# Patient Record
Sex: Female | Born: 1985 | Race: White | Hispanic: No | Marital: Single | Smoking: Never smoker
Health system: Southern US, Community
[De-identification: ages and names within clinical notes are randomized; demographics above are authoritative.]

## PROBLEM LIST (undated history)

## (undated) ENCOUNTER — Inpatient Hospital Stay (HOSPITAL_COMMUNITY): Payer: Self-pay

## (undated) DIAGNOSIS — O039 Complete or unspecified spontaneous abortion without complication: Secondary | ICD-10-CM

## (undated) DIAGNOSIS — F419 Anxiety disorder, unspecified: Secondary | ICD-10-CM

## (undated) DIAGNOSIS — D689 Coagulation defect, unspecified: Secondary | ICD-10-CM

## (undated) DIAGNOSIS — Z8632 Personal history of gestational diabetes: Secondary | ICD-10-CM

## (undated) DIAGNOSIS — F909 Attention-deficit hyperactivity disorder, unspecified type: Secondary | ICD-10-CM

## (undated) DIAGNOSIS — D684 Acquired coagulation factor deficiency: Secondary | ICD-10-CM

## (undated) DIAGNOSIS — I509 Heart failure, unspecified: Secondary | ICD-10-CM

## (undated) DIAGNOSIS — Z8759 Personal history of other complications of pregnancy, childbirth and the puerperium: Secondary | ICD-10-CM

## (undated) DIAGNOSIS — I82409 Acute embolism and thrombosis of unspecified deep veins of unspecified lower extremity: Secondary | ICD-10-CM

## (undated) HISTORY — DX: Personal history of other complications of pregnancy, childbirth and the puerperium: Z87.59

## (undated) HISTORY — PX: APPENDECTOMY: SHX54

## (undated) HISTORY — PX: TONSILLECTOMY: SUR1361

## (undated) HISTORY — DX: Personal history of gestational diabetes: Z86.32

## (undated) HISTORY — PX: VENTRAL HERNIA REPAIR: SHX424

---

## 2005-06-26 DIAGNOSIS — D689 Coagulation defect, unspecified: Secondary | ICD-10-CM

## 2009-03-14 ENCOUNTER — Emergency Department (HOSPITAL_COMMUNITY): Admission: EM | Admit: 2009-03-14 | Discharge: 2009-03-14 | Payer: Self-pay | Admitting: Emergency Medicine

## 2012-12-26 ENCOUNTER — Encounter (HOSPITAL_COMMUNITY): Payer: Self-pay | Admitting: Emergency Medicine

## 2012-12-26 ENCOUNTER — Emergency Department (HOSPITAL_COMMUNITY)
Admission: EM | Admit: 2012-12-26 | Discharge: 2012-12-26 | Disposition: A | Discharge status: Home / Self Care | Payer: Medicaid - Out of State | Attending: Emergency Medicine | Admitting: Emergency Medicine

## 2012-12-26 ENCOUNTER — Emergency Department (HOSPITAL_COMMUNITY): Payer: Medicaid - Out of State

## 2012-12-26 DIAGNOSIS — Z8659 Personal history of other mental and behavioral disorders: Secondary | ICD-10-CM | POA: Insufficient documentation

## 2012-12-26 DIAGNOSIS — R109 Unspecified abdominal pain: Secondary | ICD-10-CM | POA: Insufficient documentation

## 2012-12-26 DIAGNOSIS — O209 Hemorrhage in early pregnancy, unspecified: Secondary | ICD-10-CM | POA: Insufficient documentation

## 2012-12-26 DIAGNOSIS — Z86718 Personal history of other venous thrombosis and embolism: Secondary | ICD-10-CM | POA: Insufficient documentation

## 2012-12-26 DIAGNOSIS — N949 Unspecified condition associated with female genital organs and menstrual cycle: Secondary | ICD-10-CM | POA: Insufficient documentation

## 2012-12-26 DIAGNOSIS — R110 Nausea: Secondary | ICD-10-CM | POA: Insufficient documentation

## 2012-12-26 DIAGNOSIS — N938 Other specified abnormal uterine and vaginal bleeding: Secondary | ICD-10-CM | POA: Insufficient documentation

## 2012-12-26 DIAGNOSIS — Z862 Personal history of diseases of the blood and blood-forming organs and certain disorders involving the immune mechanism: Secondary | ICD-10-CM | POA: Insufficient documentation

## 2012-12-26 DIAGNOSIS — R42 Dizziness and giddiness: Secondary | ICD-10-CM | POA: Insufficient documentation

## 2012-12-26 DIAGNOSIS — O039 Complete or unspecified spontaneous abortion without complication: Secondary | ICD-10-CM

## 2012-12-26 HISTORY — DX: Attention-deficit hyperactivity disorder, unspecified type: F90.9

## 2012-12-26 HISTORY — DX: Coagulation defect, unspecified: D68.9

## 2012-12-26 HISTORY — DX: Acute embolism and thrombosis of unspecified deep veins of unspecified lower extremity: I82.409

## 2012-12-26 HISTORY — DX: Complete or unspecified spontaneous abortion without complication: O03.9

## 2012-12-26 HISTORY — DX: Anxiety disorder, unspecified: F41.9

## 2012-12-26 HISTORY — DX: Acquired coagulation factor deficiency: D68.4

## 2012-12-26 LAB — CBC WITH DIFFERENTIAL/PLATELET
Eosinophils Relative: 2 % (ref 0–5)
HCT: 33.7 % — ABNORMAL LOW (ref 36.0–46.0)
Lymphocytes Relative: 27 % (ref 12–46)
Lymphs Abs: 2.3 10*3/uL (ref 0.7–4.0)
MCV: 78.6 fL (ref 78.0–100.0)
Platelets: 161 10*3/uL (ref 150–400)
RBC: 4.29 MIL/uL (ref 3.87–5.11)
WBC: 8.3 10*3/uL (ref 4.0–10.5)

## 2012-12-26 LAB — WET PREP, GENITAL: Yeast Wet Prep HPF POC: NONE SEEN

## 2012-12-26 LAB — HCG, QUANTITATIVE, PREGNANCY: hCG, Beta Chain, Quant, S: <1 m[IU]/mL (ref <5)

## 2012-12-26 MED ORDER — MEGESTROL ACETATE 20 MG PO TABS: ORAL_TABLET | ORAL | Status: DC

## 2012-12-26 MED ORDER — HYDROCODONE-ACETAMINOPHEN 5-325 MG PO TABS: 1.0000 | ORAL_TABLET | ORAL | Status: DC | PRN

## 2012-12-26 MED ORDER — SODIUM CHLORIDE 0.9 % IV SOLN
INTRAVENOUS | Status: DC
  Administered 2012-12-26: 12:00:00 via INTRAVENOUS

## 2012-12-26 NOTE — ED Provider Notes (Signed)
History    CSN: [id number] Arrival date & time 12/26/12  1104  First MD Initiated Contact with Patient 12/26/12 1108     Chief Complaint  Patient presents with  . Abdominal Pain  . Vaginal Bleeding   (Consider location/radiation/quality/duration/timing/severity/associated sxs/prior Treatment) Patient is a 27 y.o. female presenting with vaginal bleeding. The history is provided by the patient.  Vaginal Bleeding Quality:  Bright red, heavier than menses and clots Severity:  Severe Onset quality:  Gradual Duration:  3 weeks Timing:  Intermittent Progression:  Unchanged Chronicity:  New Menstrual history:  Regular Possible pregnancy: miscarriage.   Context: spontaneously   Relieved by:  Nothing Worsened by:  Nothing tried Ineffective treatments: birth control pills. Associated symptoms: abdominal pain, dizziness and nausea   Associated symptoms: no fever    Brooke Sandoval is a 27 y.o. female who presents to the ED with vaginal bleeding. She states that she had her last normal period in April. She was not on any birth control. She began having heavy bleeding and abdominal cramping pain about 3 or 4 weeks ago. She passed a large clot and went to the hospital in [place]. They told her she had a miscarriage. She did not have an ultrasound at that time. She followed up with a GYN and was told her "numbers" were dropping and was scheduled to follow up for an ultrasound. She was placed on "large dose of birth control pills at that visit". She states that she returned but the doctor was not there so could not get ultrasound. Today she is feeling dizzy and continues to bleed so she came here.   Past Medical History  Diagnosis Date  . DVT (deep venous thrombosis)   . Coagulation factor disorder   . Miscarriage   . ADHD (attention deficit hyperactivity disorder)   . Anxiety    Past Surgical History  Procedure Laterality Date  . Cesarean section     No family history on file. History   Substance Use Topics  . Smoking status: Not on file  . Smokeless tobacco: Not on file  . Alcohol Use: Not on file   OB History   Grav Para Term Preterm Abortions TAB SAB Ect Mult Living                 Review of Systems  Constitutional: Negative for fever and chills.  Respiratory: Negative for shortness of breath.   Gastrointestinal: Positive for nausea and abdominal pain.  Genitourinary: Positive for vaginal bleeding.  Musculoskeletal: Negative for gait problem.  Skin: Negative for rash.  Neurological: Positive for dizziness.  Psychiatric/Behavioral: The patient is not nervous/anxious.     Allergies  Review of patient's allergies indicates no known allergies.  Home Medications   Current Outpatient Rx  Name  Route  Sig  Dispense  Refill  . ibuprofen (ADVIL,MOTRIN) 800 MG tablet   Oral   Take 800 mg by mouth every 8 (eight) hours as needed for pain.          BP 147/91  Pulse 85  Temp(Src) 98 F (36.7 C) (Oral)  Resp 20  Ht 5\' 8"  (1.727 m)  Wt 348 lb (157.852 kg)  BMI 52.93 kg/m2  SpO2 100% Physical Exam  Nursing note and vitals reviewed. Constitutional: She is oriented to person, place, and time. No distress.  Morbidly obese W/F  HENT:  Head: Normocephalic.  Eyes: EOM are normal.  Neck: Neck supple.  Cardiovascular: Normal rate.   Pulmonary/Chest: Effort normal.  Abdominal:  Soft. There is tenderness in the right lower quadrant and left lower quadrant. There is no rebound, no guarding and no CVA tenderness.  Genitourinary:  External genitalia without lesions. Small blood vaginal vault. Positive CMT, bilateral adnexal tenderness. Unable to palpate uterus due to patient habitus.  Musculoskeletal: Normal range of motion.  Neurological: She is alert and oriented to person, place, and time. No cranial nerve deficit.  Skin: Skin is warm and dry.  Psychiatric: She has a normal mood and affect. Her behavior is normal.    Results for orders placed during the  hospital encounter of 12/26/12 (from the past 24 hour(s))  CBC WITH DIFFERENTIAL     Status: Abnormal   Collection Time    12/26/12 12:05 PM      Result Value Range   WBC 8.3  4.0 - 10.5 K/uL   RBC 4.29  3.87 - 5.11 MIL/uL   Hemoglobin 11.0 (*) 12.0 - 15.0 g/dL   HCT 16.1 (*) 09.6 - 04.5 %   MCV 78.6  78.0 - 100.0 fL   MCH 25.6 (*) 26.0 - 34.0 pg   MCHC 32.6  30.0 - 36.0 g/dL   RDW 40.9  81.1 - 91.4 %   Platelets 161  150 - 400 K/uL   Neutrophils Relative % 67  43 - 77 %   Neutro Abs 5.6  1.7 - 7.7 K/uL   Lymphocytes Relative 27  12 - 46 %   Lymphs Abs 2.3  0.7 - 4.0 K/uL   Monocytes Relative 3  3 - 12 %   Monocytes Absolute 0.2  0.1 - 1.0 K/uL   Eosinophils Relative 2  0 - 5 %   Eosinophils Absolute 0.2  0.0 - 0.7 K/uL   Basophils Relative 0  0 - 1 %   Basophils Absolute 0.0  0.0 - 0.1 K/uL  HCG, QUANTITATIVE, PREGNANCY     Status: None   Collection Time    12/26/12 12:05 PM      Result Value Range   hCG, Beta Chain, Quant, S <1  <5 mIU/mL  ABO/RH     Status: None   Collection Time    12/26/12 12:05 PM      Result Value Range   ABO/RH(D) A POS      ED Course  Procedures   US Transvaginal Non-ob  12/26/2012   *RADIOLOGY REPORT*  Clinical Data: Status post SAB with pelvic pain and heavy bleeding. LMP 09/24/2012  TRANSABDOMINAL AND TRANSVAGINAL ULTRASOUND OF PELVIS Technique:  Both transabdominal and transvaginal ultrasound examinations of the pelvis were performed. Transabdominal technique was performed for global imaging of the pelvis including uterus, ovaries, adnexal regions, and pelvic cul-de-sac.  It was necessary to proceed with endovaginal exam following the transabdominal exam to visualize the myometrium, endometrium and adnexa.  Comparison:  None  Findings:  Uterus: Is anteverted and anteflexed and demonstrates a sagittal length of 11 cm, depth of 4.7 cm and width of 4.3 cm.  No definite mural abnormality is seen  Endometrium: A small amount of simple free fluid is noted  in the endometrial canal.  The endometrial lining appears thin with a single layer measurement 1 mm.  No areas of focal thickening or heterogeneity are identified.  No soft tissue is seen within the endometrial canal to suggest the presence of retained products of conception in this patient with history of recent SAB.  Color Doppler reveals no areas of focal flow within the endometrial canal.  Right ovary:  Is  not seen with confidence either transabdominally or endovaginally  Left ovary: Has a normal appearance measuring 2.4 x 1.4 x 2.1 cm  Other findings: Trace of simple free fluid is seen in the cul-de- sac.  IMPRESSION: No evidence for retained products of conception or focal endometrial abnormality.  Normal myometrium and left ovary.  Non-visualized right ovary.   Original Report Authenticated By: Rhodia Albright, M.D.   US Pelvis Complete  12/26/2012   *RADIOLOGY REPORT*  Clinical Data: Status post SAB with pelvic pain and heavy bleeding. LMP 09/24/2012  TRANSABDOMINAL AND TRANSVAGINAL ULTRASOUND OF PELVIS Technique:  Both transabdominal and transvaginal ultrasound examinations of the pelvis were performed. Transabdominal technique was performed for global imaging of the pelvis including uterus, ovaries, adnexal regions, and pelvic cul-de-sac.  It was necessary to proceed with endovaginal exam following the transabdominal exam to visualize the myometrium, endometrium and adnexa.  Comparison:  None  Findings:  Uterus: Is anteverted and anteflexed and demonstrates a sagittal length of 11 cm, depth of 4.7 cm and width of 4.3 cm.  No definite mural abnormality is seen  Endometrium: A small amount of simple free fluid is noted in the endometrial canal.  The endometrial lining appears thin with a single layer measurement 1 mm.  No areas of focal thickening or heterogeneity are identified.  No soft tissue is seen within the endometrial canal to suggest the presence of retained products of conception in this patient  with history of recent SAB.  Color Doppler reveals no areas of focal flow within the endometrial canal.  Right ovary:  Is not seen with confidence either transabdominally or endovaginally  Left ovary: Has a normal appearance measuring 2.4 x 1.4 x 2.1 cm  Other findings: Trace of simple free fluid is seen in the cul-de- sac.  IMPRESSION: No evidence for retained products of conception or focal endometrial abnormality.  Normal myometrium and left ovary.  Non-visualized right ovary.   Original Report Authenticated By: Rhodia Albright, M.D.    MDM  27 y.o. female with complete SAB and abnormal vaginal bleeding. Will start patient on Megace and have her follow up with Dr. Emelda Fear for further evaluation. Her Hgb. Is stable and she is not orthostatic. No concern for ectopic pregnancy or retained POC as Bhcg is <1 and ultrasound shows no abnormalities.  I have reviewed this patient's vital signs, nurses notes, appropriate labs and imaging.  I have discussed findings in detail with the patient and plan of care. She voices understanding.   Medication List    TAKE these medications       HYDROcodone-acetaminophen 5-325 MG per tablet  Commonly known as:  NORCO/VICODIN  Take 1 tablet by mouth every 4 (four) hours as needed.     megestrol 20 MG tablet  Commonly known as:  MEGACE  - Take two tablets (40 mg) three times per day time three days,   - then take two tablets (40 mg) two times per day time three days,   - then take two tablets (40 mg)once per day      ASK your doctor about these medications       ibuprofen 800 MG tablet  Commonly known as:  ADVIL,MOTRIN  Take 800 mg by mouth every 8 (eight) hours as needed for pain.         [place], [place] 12/26/12 [phone]

## 2012-12-26 NOTE — ED Notes (Signed)
States that she was diagnosed with a miscarriage and prescribed birth control medication 1 week ago.  States that she is having heavy vaginal bleeding and feels like she is having contractions.

## 2012-12-27 LAB — GC/CHLAMYDIA PROBE AMP: GC Probe RNA: NEGATIVE

## 2012-12-27 NOTE — ED Provider Notes (Signed)
Medical screening examination/treatment/procedure(s) were performed by non-physician practitioner and as supervising physician I was immediately available for consultation/collaboration.   Kadynce Bonds B. Bernette Mayers, MD 12/27/12 [phone]

## 2013-09-29 ENCOUNTER — Encounter (HOSPITAL_COMMUNITY): Payer: Self-pay | Admitting: Emergency Medicine

## 2013-09-29 ENCOUNTER — Emergency Department (HOSPITAL_COMMUNITY)
Admission: EM | Admit: 2013-09-29 | Discharge: 2013-09-29 | Discharge status: Against Medical Advice | Payer: Medicaid - Out of State | Attending: Emergency Medicine | Admitting: Emergency Medicine

## 2013-09-29 DIAGNOSIS — R112 Nausea with vomiting, unspecified: Secondary | ICD-10-CM | POA: Insufficient documentation

## 2013-09-29 DIAGNOSIS — M79609 Pain in unspecified limb: Secondary | ICD-10-CM | POA: Insufficient documentation

## 2013-09-29 NOTE — ED Notes (Signed)
Called for ex-ray no answer

## 2013-09-29 NOTE — ED Notes (Addendum)
Pt thinks she may be preg LMP 07/27/13 it was a jhort one and she has a foot pain  Also has been N/v G6 P4 A2 L4 last one   Delivered at at 24 weeks  Is very high risk preg

## 2013-09-29 NOTE — ED Notes (Signed)
Called from waiting waiting room no answer

## 2013-10-07 ENCOUNTER — Encounter (HOSPITAL_COMMUNITY): Payer: Self-pay | Admitting: Emergency Medicine

## 2013-10-07 ENCOUNTER — Emergency Department (HOSPITAL_COMMUNITY): Payer: Medicaid - Out of State

## 2013-10-07 ENCOUNTER — Emergency Department (HOSPITAL_COMMUNITY)
Admission: EM | Admit: 2013-10-07 | Discharge: 2013-10-08 | Disposition: A | Discharge status: Home / Self Care | Payer: Medicaid - Out of State | Attending: Emergency Medicine | Admitting: Emergency Medicine

## 2013-10-07 DIAGNOSIS — S060X1A Concussion with loss of consciousness of 30 minutes or less, initial encounter: Secondary | ICD-10-CM | POA: Insufficient documentation

## 2013-10-07 DIAGNOSIS — R55 Syncope and collapse: Secondary | ICD-10-CM | POA: Insufficient documentation

## 2013-10-07 DIAGNOSIS — S0990XA Unspecified injury of head, initial encounter: Secondary | ICD-10-CM

## 2013-10-07 DIAGNOSIS — R296 Repeated falls: Secondary | ICD-10-CM | POA: Insufficient documentation

## 2013-10-07 DIAGNOSIS — Z3490 Encounter for supervision of normal pregnancy, unspecified, unspecified trimester: Secondary | ICD-10-CM

## 2013-10-07 DIAGNOSIS — Y939 Activity, unspecified: Secondary | ICD-10-CM | POA: Insufficient documentation

## 2013-10-07 DIAGNOSIS — Z86718 Personal history of other venous thrombosis and embolism: Secondary | ICD-10-CM | POA: Insufficient documentation

## 2013-10-07 DIAGNOSIS — Z8659 Personal history of other mental and behavioral disorders: Secondary | ICD-10-CM | POA: Insufficient documentation

## 2013-10-07 DIAGNOSIS — O9989 Other specified diseases and conditions complicating pregnancy, childbirth and the puerperium: Secondary | ICD-10-CM | POA: Insufficient documentation

## 2013-10-07 DIAGNOSIS — S20219A Contusion of unspecified front wall of thorax, initial encounter: Secondary | ICD-10-CM | POA: Insufficient documentation

## 2013-10-07 DIAGNOSIS — Y929 Unspecified place or not applicable: Secondary | ICD-10-CM | POA: Insufficient documentation

## 2013-10-07 LAB — CBC WITH DIFFERENTIAL/PLATELET
BASOS PCT: 1 % (ref 0–1)
Basophils Absolute: 0.1 10*3/uL (ref 0.0–0.1)
Eosinophils Absolute: 0.2 10*3/uL (ref 0.0–0.7)
Eosinophils Relative: 2 % (ref 0–5)
HEMATOCRIT: 35.7 % — AB (ref 36.0–46.0)
HEMOGLOBIN: 11.8 g/dL — AB (ref 12.0–15.0)
LYMPHS ABS: 3 10*3/uL (ref 0.7–4.0)
LYMPHS PCT: 33 % (ref 12–46)
MCH: 26.3 pg (ref 26.0–34.0)
MCHC: 33.1 g/dL (ref 30.0–36.0)
MCV: 79.7 fL (ref 78.0–100.0)
MONO ABS: 0.6 10*3/uL (ref 0.1–1.0)
MONOS PCT: 7 % (ref 3–12)
NEUTROS ABS: 5.2 10*3/uL (ref 1.7–7.7)
NEUTROS PCT: 57 % (ref 43–77)
Platelets: 178 10*3/uL (ref 150–400)
RBC: 4.48 MIL/uL (ref 3.87–5.11)
RDW: 16.7 % — ABNORMAL HIGH (ref 11.5–15.5)
WBC: 9.1 10*3/uL (ref 4.0–10.5)

## 2013-10-07 LAB — URINALYSIS, ROUTINE W REFLEX MICROSCOPIC
Bilirubin Urine: NEGATIVE
GLUCOSE, UA: NEGATIVE mg/dL
HGB URINE DIPSTICK: NEGATIVE
Ketones, ur: NEGATIVE mg/dL
Leukocytes, UA: NEGATIVE
Nitrite: NEGATIVE
PH: 5.5 (ref 5.0–8.0)
Protein, ur: NEGATIVE mg/dL
SPECIFIC GRAVITY, URINE: 1.025 (ref 1.005–1.030)
UROBILINOGEN UA: 0.2 mg/dL (ref 0.0–1.0)

## 2013-10-07 LAB — BASIC METABOLIC PANEL
BUN: 12 mg/dL (ref 6–23)
CHLORIDE: 102 meq/L (ref 96–112)
CO2: 25 mEq/L (ref 19–32)
Calcium: 9.2 mg/dL (ref 8.4–10.5)
Creatinine, Ser: 0.93 mg/dL (ref 0.50–1.10)
GFR, EST NON AFRICAN AMERICAN: 82 mL/min — AB (ref >90)
Glucose, Bld: 107 mg/dL — ABNORMAL HIGH (ref 70–99)
POTASSIUM: 4 meq/L (ref 3.7–5.3)
SODIUM: 138 meq/L (ref 137–147)

## 2013-10-07 LAB — HCG, SERUM, QUALITATIVE: PREG SERUM: POSITIVE — AB

## 2013-10-07 MED ORDER — HYDROCODONE-ACETAMINOPHEN 5-325 MG PO TABS: 2.0000 | ORAL_TABLET | Freq: Every evening | ORAL | Status: DC | PRN

## 2013-10-07 MED ORDER — HYDROCODONE-ACETAMINOPHEN 5-325 MG PO TABS
2.0000 | ORAL_TABLET | Freq: Once | ORAL | Status: AC
  Administered 2013-10-07: 2 via ORAL
  Filled 2013-10-07: qty 2

## 2013-10-07 NOTE — Discharge Instructions (Signed)
You have been diagnosed by your caregiver as having chest wall pain. SEEK IMMEDIATE MEDICAL ATTENTION IF: You develop a fever.  Your chest pains become severe or intolerable.  You develop new, unexplained symptoms (problems).  You develop shortness of breath, nausea, vomiting, sweating or feel light headed.  You develop a new cough or you cough up blood.  You have had a head injury which does not appear to require admission at this time. A concussion is a state of changed mental ability from trauma. SEEK IMMEDIATE MEDICAL ATTENTION IF: There is confusion or drowsiness (although children frequently become drowsy after injury).  You cannot awaken the injured person.  There is nausea (feeling sick to your stomach) or continued, forceful vomiting.  You notice dizziness or unsteadiness which is getting worse, or inability to walk.  You have convulsions or unconsciousness.  You experience severe, persistent headaches not relieved by Tylenol?. (Do not take aspirin as this impairs clotting abilities). Take other pain medications only as directed.  You cannot use arms or legs normally.  There are changes in pupil sizes. (This is the black center in the colored part of the eye)  There is clear or bloody discharge from the nose or ears.  Change in speech, vision, swallowing, or understanding.  Localized weakness, numbness, tingling, or change in bowel or bladder control.  Your caregiver has seen you today because you are having problems with feelings of weakness, dizziness, and/or fatigue. Weakness has many different causes, some of which are common and others are very rare. Your caregiver has considered some of the most common causes of weakness and feels it is safe for you to go home and be observed. Not every illness or injury can be identified during an emergency department visit, thus follow-up with your primary healthcare provider is important. Medical conditions can also worsen, so it is also  important to return immediately as directed below, or if you have other serious concerns develop. RETURN IMMEDIATELY IF you develop new shortness of breath, chest pain, fever, have difficulty moving parts of your body (new weakness, numbness, or incoordination), sudden change in speech, vision, swallowing, or understanding, faint or develop new dizziness, severe headache, become poorly responsive or have an altered mental status compared to baseline for you, new rash, abdominal pain, or bloody stools,  Return sooner also if you develop new problems for which you have not talked to your caregiver but you feel may be emergency medical conditions, or are unable to be cared for safely at home.

## 2013-10-07 NOTE — ED Notes (Signed)
Pt states she fell on Saturday. Pt complaining left side & hip pain. Pt states she is SOB also when she bends over. Pt says thinks she may be pregnant no period in 3 months.

## 2013-10-07 NOTE — ED Provider Notes (Signed)
CSN: [id number]     Arrival date & time 10/07/13  2030 History   First MD Initiated Contact with Patient 10/07/13 2109    This chart was scribed for Hurman HornJohn M Llana Deshazo, MD by Marica OtterNusrat Rahman, ED Scribe. This patient was seen in room APOTF/OTF and the patient's care was started at 9:12 PM.  PCP: No PCP Per Patient  Chief Complaint  Patient presents with  . Fall   HPI HPI Comments: Brooke Sandoval is a 28 y.o. female, with a history of coagulation factor disorder and DVT, who presents to the Emergency Department complaining of a fall onset three days ago. Pt also complains of associated intermittent nausea, loss of consciousness (witnessed by her husband), nasal pain and chest wall pain onset 3 days ago. Pt reports that she had nausea and vomiting on Saturday prior to her fall. Pt reports she lost consciousness which led her to fall on her face, and consequent nasal pain and chest wall pain. Pt denies any present HA, one sided weakness, fever, diarrhea, abd pain, or vision changes.   Pt also complains of urgency onset a few days ago. Pt reports she had similar symptoms a few months ago.   Pt also reports that she may be pregnant. Pt states she has not had her period in three months and she took several home pregnancy tests all of which were positive. No abdominal pain or vaginal bleeding.  Past Medical History  Diagnosis Date  . DVT (deep venous thrombosis)   . Coagulation factor disorder   . Miscarriage   . ADHD (attention deficit hyperactivity disorder)   . Anxiety    Past Surgical History  Procedure Laterality Date  . Cesarean section     No family history on file. History  Substance Use Topics  . Smoking status: Never Smoker   . Smokeless tobacco: Not on file  . Alcohol Use: No   OB History   Grav Para Term Preterm Abortions TAB SAB Ect Mult Living   1              Review of Systems  A complete 10 system review of systems was obtained and all systems are negative except as noted  in the HPI and PMH.    Allergies  Prednisone  Home Medications   Prior to Admission medications   Medication Sig Start Date End Date Taking? Authorizing Provider  amphetamine-dextroamphetamine (ADDERALL) 30 MG tablet Take 30 mg by mouth 3 (three) times daily.   Yes Historical Provider, MD  ibuprofen (ADVIL,MOTRIN) 200 MG tablet Take 200 mg by mouth every 6 (six) hours as needed for mild pain or moderate pain.   Yes Historical Provider, MD   Triage Vitals: BP 141/68  Pulse 87  Temp(Src) 98.3 F (36.8 C) (Oral)  Resp 18  Ht 5\' 7"  (1.702 m)  Wt 354 lb (160.573 kg)  BMI 55.43 kg/m2  SpO2 100%  LMP 07/09/2013 Physical Exam  Nursing note and vitals reviewed. Constitutional:  Awake, alert, nontoxic appearance with baseline speech for patient.  HENT:  Head: Atraumatic.  Mouth/Throat: No oropharyngeal exudate.  Tender nasal bridge. Nares no septal hematoma. Normal jaw opening, normal jaw occlusion.   Eyes: EOM are normal. Pupils are equal, round, and reactive to light. Right eye exhibits no discharge. Left eye exhibits no discharge.  Neck: Neck supple.  Cervical spine non-tender. No midline tenderness.   Cardiovascular: Normal rate and regular rhythm.   No murmur heard. Pulmonary/Chest: Effort normal and breath sounds normal. No  stridor. No respiratory distress. She has no wheezes. She has no rales. She exhibits no tenderness.  Left chest wall tenderness without palpable deformity.   Abdominal: Soft. Bowel sounds are normal. She exhibits no mass. There is no tenderness. There is no rebound.  Musculoskeletal: She exhibits no tenderness.  Baseline ROM, moves extremities with no obvious new focal weakness.  Lymphadenopathy:    She has no cervical adenopathy.  Neurological:  Awake, alert, cooperative and aware of situation; motor strength bilaterally; sensation normal to light touch bilaterally; peripheral visual fields full to confrontation; no facial asymmetry; tongue midline; major  cranial nerves appear intact; no pronator drift, normal finger to nose bilaterally, baseline gait without new ataxia.  Skin: No rash noted.  Psychiatric: She has a normal mood and affect.    ED Course  Procedures (including critical care time) DIAGNOSTIC STUDIES: Oxygen Saturation is 100% on RA, normal by my interpretation.    COORDINATION OF CARE:  9:21 PM-Discussed treatment plan which includes UA, pregnancy test, EKG, chest x-ray, and labs with pt at bedside and pt agreed to plan.   Labs Review Labs Reviewed  BASIC METABOLIC PANEL - Abnormal; Notable for the following:    Glucose, Bld 107 (*)    GFR calc non Af Amer 82 (*)    All other components within normal limits  CBC WITH DIFFERENTIAL - Abnormal; Notable for the following:    Hemoglobin 11.8 (*)    HCT 35.7 (*)    RDW 16.7 (*)    All other components within normal limits  HCG, SERUM, QUALITATIVE - Abnormal; Notable for the following:    Preg, Serum POSITIVE (*)    All other components within normal limits  URINALYSIS, ROUTINE W REFLEX MICROSCOPIC    Imaging Review No results found.  Muse not working; ECG: Normal sinus rhythm, ventricular rate 91, normal axis, normal intervals, impression normal ECG, no comparison ECG available  MDM   Final diagnoses:  Syncope  Minor head injury  Chest wall contusion  Pregnancy    I doubt any other EMC precluding discharge at this time including, but not necessarily limited to the following:ICH, CSI, ectopic pregnancy, cardiac syncope.  I personally performed the services described in this documentation, which was scribed in my presence. The recorded information has been reviewed and is accurate.    Hurman HornJohn M Tyronda Vizcarrondo, MD 10/12/13 [phone]

## 2013-11-06 ENCOUNTER — Emergency Department (HOSPITAL_COMMUNITY)
Admission: EM | Admit: 2013-11-06 | Discharge: 2013-11-06 | Disposition: A | Discharge status: Home / Self Care | Payer: Medicaid - Out of State | Attending: Emergency Medicine | Admitting: Emergency Medicine

## 2013-11-06 ENCOUNTER — Emergency Department (HOSPITAL_COMMUNITY): Payer: Medicaid - Out of State

## 2013-11-06 ENCOUNTER — Encounter (HOSPITAL_COMMUNITY): Payer: Self-pay | Admitting: Emergency Medicine

## 2013-11-06 DIAGNOSIS — O9989 Other specified diseases and conditions complicating pregnancy, childbirth and the puerperium: Secondary | ICD-10-CM | POA: Insufficient documentation

## 2013-11-06 DIAGNOSIS — Z3490 Encounter for supervision of normal pregnancy, unspecified, unspecified trimester: Secondary | ICD-10-CM

## 2013-11-06 DIAGNOSIS — O99340 Other mental disorders complicating pregnancy, unspecified trimester: Secondary | ICD-10-CM | POA: Insufficient documentation

## 2013-11-06 DIAGNOSIS — N939 Abnormal uterine and vaginal bleeding, unspecified: Secondary | ICD-10-CM

## 2013-11-06 DIAGNOSIS — Z7901 Long term (current) use of anticoagulants: Secondary | ICD-10-CM | POA: Insufficient documentation

## 2013-11-06 DIAGNOSIS — O210 Mild hyperemesis gravidarum: Secondary | ICD-10-CM | POA: Insufficient documentation

## 2013-11-06 DIAGNOSIS — Z791 Long term (current) use of non-steroidal anti-inflammatories (NSAID): Secondary | ICD-10-CM | POA: Insufficient documentation

## 2013-11-06 DIAGNOSIS — Z86718 Personal history of other venous thrombosis and embolism: Secondary | ICD-10-CM | POA: Insufficient documentation

## 2013-11-06 DIAGNOSIS — R5381 Other malaise: Secondary | ICD-10-CM | POA: Insufficient documentation

## 2013-11-06 DIAGNOSIS — R5383 Other fatigue: Secondary | ICD-10-CM

## 2013-11-06 DIAGNOSIS — O209 Hemorrhage in early pregnancy, unspecified: Secondary | ICD-10-CM | POA: Insufficient documentation

## 2013-11-06 DIAGNOSIS — F909 Attention-deficit hyperactivity disorder, unspecified type: Secondary | ICD-10-CM | POA: Insufficient documentation

## 2013-11-06 LAB — CBC WITH DIFFERENTIAL/PLATELET
Basophils Absolute: 0 10*3/uL (ref 0.0–0.1)
Basophils Relative: 0 % (ref 0–1)
EOS ABS: 0.2 10*3/uL (ref 0.0–0.7)
EOS PCT: 3 % (ref 0–5)
HEMATOCRIT: 35.1 % — AB (ref 36.0–46.0)
Hemoglobin: 11.4 g/dL — ABNORMAL LOW (ref 12.0–15.0)
LYMPHS ABS: 1.7 10*3/uL (ref 0.7–4.0)
Lymphocytes Relative: 32 % (ref 12–46)
MCH: 26.2 pg (ref 26.0–34.0)
MCHC: 32.5 g/dL (ref 30.0–36.0)
MCV: 80.7 fL (ref 78.0–100.0)
MONO ABS: 0.3 10*3/uL (ref 0.1–1.0)
Monocytes Relative: 5 % (ref 3–12)
Neutro Abs: 3.2 10*3/uL (ref 1.7–7.7)
Neutrophils Relative %: 60 % (ref 43–77)
PLATELETS: 184 10*3/uL (ref 150–400)
RBC: 4.35 MIL/uL (ref 3.87–5.11)
RDW: 15.7 % — ABNORMAL HIGH (ref 11.5–15.5)
WBC: 5.3 10*3/uL (ref 4.0–10.5)

## 2013-11-06 LAB — HCG, QUANTITATIVE, PREGNANCY: hCG, Beta Chain, Quant, S: 19417 m[IU]/mL — ABNORMAL HIGH (ref <5)

## 2013-11-06 MED ORDER — HYDROCODONE-ACETAMINOPHEN 5-325 MG PO TABS: 2.0000 | ORAL_TABLET | ORAL | Status: DC | PRN

## 2013-11-06 MED ORDER — ACETAMINOPHEN 500 MG PO TABS
1000.0000 mg | ORAL_TABLET | Freq: Once | ORAL | Status: AC
  Administered 2013-11-06: 1000 mg via ORAL
  Filled 2013-11-06: qty 2

## 2013-11-06 NOTE — Discharge Instructions (Signed)
Stop your Lovenox.  Hydrocodone as prescribed as needed for pain.  Followup with your OB to arrange a followup appointment, ideally this will be tomorrow.  Return to the emergency department if you develop significantly worsening bleeding.   Pregnancy - First Trimester During sexual intercourse, millions of sperm go into the vagina. Only 1 sperm will penetrate and fertilize the female egg while it is in the Fallopian tube. One week later, the fertilized egg implants into the wall of the uterus. An embryo begins to develop into a baby. At 6 to 8 weeks, the eyes and face are formed and the heartbeat can be seen on ultrasound. At the end of 12 weeks (first trimester), all the baby's organs are formed. Now that you are pregnant, you will want to do everything you can to have a healthy baby. Two of the most important things are to get good prenatal care and follow your caregiver's instructions. Prenatal care is all the medical care you receive before the baby's birth. It is given to prevent, find, and treat problems during the pregnancy and childbirth. PRENATAL EXAMS  During prenatal visits, your weight, blood pressure, and urine are checked. This is done to make sure you are healthy and progressing normally during the pregnancy.  A pregnant woman should gain 25 to 35 pounds during the pregnancy. However, if you are overweight or underweight, your caregiver will advise you regarding your weight.  Your caregiver will ask and answer questions for you.  Blood work, cervical cultures, other necessary tests, and a Pap test are done during your prenatal exams. These tests are done to check on your health and the probable health of your baby. Tests are strongly recommended and done for HIV with your permission. This is the virus that causes AIDS. These tests are done because medicines can be given to help prevent your baby from being born with this infection should you have been infected without knowing it.  Blood work is also used to find out your blood type, previous infections, and follow your blood levels (hemoglobin).  Low hemoglobin (anemia) is common during pregnancy. Iron and vitamins are given to help prevent this. Later in the pregnancy, blood tests for diabetes will be done along with any other tests if any problems develop.  You may need other tests to make sure you and the baby are doing well. CHANGES DURING THE FIRST TRIMESTER  Your body goes through many changes during pregnancy. They vary from person to person. Talk to your caregiver about changes you notice and are concerned about. Changes can include:  Your menstrual period stops.  The egg and sperm carry the genes that determine what you look like. Genes from you and your partner are forming a baby. The female genes determine whether the baby is a boy or a girl.  Your body increases in girth and you may feel bloated.  Feeling sick to your stomach (nauseous) and throwing up (vomiting). If the vomiting is uncontrollable, call your caregiver.  Your breasts will begin to enlarge and become tender.  Your nipples may stick out more and become darker.  The need to urinate more. Painful urination may mean you have a bladder infection.  Tiring easily.  Loss of appetite.  Cravings for certain kinds of food.  At first, you may gain or lose a couple of pounds.  You may have changes in your emotions from day to day (excited to be pregnant or concerned something may go wrong with the pregnancy  and baby).  You may have more vivid and strange dreams. HOME CARE INSTRUCTIONS   It is very important to avoid all smoking, alcohol and non-prescribed drugs during your pregnancy. These affect the formation and growth of the baby. Avoid chemicals while pregnant to ensure the delivery of a healthy infant.  Start your prenatal visits by the 12th week of pregnancy. They are usually scheduled monthly at first, then more often in the last 2  months before delivery. Keep your caregiver's appointments. Follow your caregiver's instructions regarding medicine use, blood and lab tests, exercise, and diet.  During pregnancy, you are providing food for you and your baby. Eat regular, well-balanced meals. Choose foods such as meat, fish, milk and other low fat dairy products, vegetables, fruits, and whole-grain breads and cereals. Your caregiver will tell you of the ideal weight gain.  You can help morning sickness by keeping soda crackers at the bedside. Eat a couple before arising in the morning. You may want to use the crackers without salt on them.  Eating 4 to 5 small meals rather than 3 large meals a day also may help the nausea and vomiting.  Drinking liquids between meals instead of during meals also seems to help nausea and vomiting.  A physical sexual relationship may be continued throughout pregnancy if there are no other problems. Problems may be early (premature) leaking of amniotic fluid from the membranes, vaginal bleeding, or belly (abdominal) pain.  Exercise regularly if there are no restrictions. Check with your caregiver or physical therapist if you are unsure of the safety of some of your exercises. Greater weight gain will occur in the last 2 trimesters of pregnancy. Exercising will help:  Control your weight.  Keep you in shape.  Prepare you for labor and delivery.  Help you lose your pregnancy weight after you deliver your baby.  Wear a good support or jogging bra for breast tenderness during pregnancy. This may help if worn during sleep too.  Ask when prenatal classes are available. Begin classes when they are offered.  Do not use hot tubs, steam rooms, or saunas.  Wear your seat belt when driving. This protects you and your baby if you are in an accident.  Avoid raw meat, uncooked cheese, cat litter boxes, and soil used by cats throughout the pregnancy. These carry germs that can cause birth defects in the  baby.  The first trimester is a good time to visit your dentist for your dental health. Getting your teeth cleaned is okay. Use a softer toothbrush and brush gently during pregnancy.  Ask for help if you have financial, counseling, or nutritional needs during pregnancy. Your caregiver will be able to offer counseling for these needs as well as refer you for other special needs.  Do not take any medicines or herbs unless told by your caregiver.  Inform your caregiver if there is any mental or physical domestic violence.  Make a list of emergency phone numbers of family, friends, hospital, and police and fire departments.  Write down your questions. Take them to your prenatal visit.  Do not douche.  Do not cross your legs.  If you have to stand for long periods of time, rotate you feet or take small steps in a circle.  You may have more vaginal secretions that may require a sanitary pad. Do not use tampons or scented sanitary pads. MEDICINES AND DRUG USE IN PREGNANCY  Take prenatal vitamins as directed. The vitamin should contain 1 milligram of  folic acid. Keep all vitamins out of reach of children. Only a couple vitamins or tablets containing iron may be fatal to a baby or young child when ingested.  Avoid use of all medicines, including herbs, over-the-counter medicines, not prescribed or suggested by your caregiver. Only take over-the-counter or prescription medicines for pain, discomfort, or fever as directed by your caregiver. Do not use aspirin, ibuprofen, or naproxen unless directed by your caregiver.  Let your caregiver also know about herbs you may be using.  Alcohol is related to a number of birth defects. This includes fetal alcohol syndrome. All alcohol, in any form, should be avoided completely. Smoking will cause low birth rate and premature babies.  Street or illegal drugs are very harmful to the baby. They are absolutely forbidden. A baby born to an addicted mother will  be addicted at birth. The baby will go through the same withdrawal an adult does.  Let your caregiver know about any medicines that you have to take and for what reason you take them. SEEK MEDICAL CARE IF:  You have any concerns or worries during your pregnancy. It is better to call with your questions if you feel they cannot wait, rather than worry about them. SEEK IMMEDIATE MEDICAL CARE IF:   An unexplained oral temperature above 102 F (38.9 C) develops, or as your caregiver suggests.  You have leaking of fluid from the vagina (birth canal). If leaking membranes are suspected, take your temperature and inform your caregiver of this when you call.  There is vaginal spotting or bleeding. Notify your caregiver of the amount and how many pads are used.  You develop a bad smelling vaginal discharge with a change in the color.  You continue to feel sick to your stomach (nauseated) and have no relief from remedies suggested. You vomit blood or coffee ground-like materials.  You lose more than 2 pounds of weight in 1 week.  You gain more than 2 pounds of weight in 1 week and you notice swelling of your face, hands, feet, or legs.  You gain 5 pounds or more in 1 week (even if you do not have swelling of your hands, face, legs, or feet).  You get exposed to [place] measles and have never had them.  You are exposed to fifth disease or chickenpox.  You develop belly (abdominal) pain. Round ligament discomfort is a common non-cancerous (benign) cause of abdominal pain in pregnancy. Your caregiver still must evaluate this.  You develop headache, fever, diarrhea, pain with urination, or shortness of breath.  You fall or are in a car accident or have any kind of trauma.  There is mental or physical violence in your home. Document Released: 06/06/2001 Document Revised: 03/06/2012 Document Reviewed: 12/08/2008 [employer] Patient Information 2014 [employer] [place].
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
## 2013-11-06 NOTE — Care Management Note (Signed)
ED/CM noted patient did not have health insurance and/or PCP listed in the computer.  Patient was given the [place] resource handout with information on the clinics, food pantries, and the handout for new health insurance sign-up.  Patient expressed appreciation for information received. 

## 2013-11-06 NOTE — ED Notes (Signed)
Pt states she is [redacted] weeks pregnant. States she has been bleeding for a week. States she has been to her OB GYN and was told to come to the ED. States she is on levonex for blood clots

## 2014-04-27 ENCOUNTER — Encounter (HOSPITAL_COMMUNITY): Payer: Self-pay | Admitting: Emergency Medicine

## 2014-08-28 DIAGNOSIS — Z6841 Body Mass Index (BMI) 40.0 and over, adult: Secondary | ICD-10-CM | POA: Insufficient documentation

## 2014-08-28 DIAGNOSIS — O09291 Supervision of pregnancy with other poor reproductive or obstetric history, first trimester: Secondary | ICD-10-CM | POA: Insufficient documentation

## 2014-09-12 DIAGNOSIS — A749 Chlamydial infection, unspecified: Secondary | ICD-10-CM | POA: Insufficient documentation

## 2014-09-12 DIAGNOSIS — Z8619 Personal history of other infectious and parasitic diseases: Secondary | ICD-10-CM

## 2014-09-12 HISTORY — DX: Personal history of other infectious and parasitic diseases: Z86.19

## 2014-10-08 DIAGNOSIS — O34219 Maternal care for unspecified type scar from previous cesarean delivery: Secondary | ICD-10-CM | POA: Insufficient documentation

## 2014-10-08 DIAGNOSIS — F419 Anxiety disorder, unspecified: Secondary | ICD-10-CM | POA: Insufficient documentation

## 2014-10-08 DIAGNOSIS — Z86711 Personal history of pulmonary embolism: Secondary | ICD-10-CM | POA: Insufficient documentation

## 2014-10-08 DIAGNOSIS — O0990 Supervision of high risk pregnancy, unspecified, unspecified trimester: Secondary | ICD-10-CM | POA: Insufficient documentation

## 2014-10-08 DIAGNOSIS — O99119 Other diseases of the blood and blood-forming organs and certain disorders involving the immune mechanism complicating pregnancy, unspecified trimester: Secondary | ICD-10-CM | POA: Insufficient documentation

## 2014-10-08 DIAGNOSIS — F902 Attention-deficit hyperactivity disorder, combined type: Secondary | ICD-10-CM

## 2014-10-08 DIAGNOSIS — O0993 Supervision of high risk pregnancy, unspecified, third trimester: Secondary | ICD-10-CM | POA: Insufficient documentation

## 2014-10-08 HISTORY — DX: Attention-deficit hyperactivity disorder, combined type: F90.2

## 2014-10-10 DIAGNOSIS — K089 Disorder of teeth and supporting structures, unspecified: Secondary | ICD-10-CM | POA: Insufficient documentation

## 2014-10-10 HISTORY — DX: Disorder of teeth and supporting structures, unspecified: K08.9

## 2014-12-09 DIAGNOSIS — O10919 Unspecified pre-existing hypertension complicating pregnancy, unspecified trimester: Secondary | ICD-10-CM | POA: Insufficient documentation

## 2014-12-09 DIAGNOSIS — J45909 Unspecified asthma, uncomplicated: Secondary | ICD-10-CM | POA: Insufficient documentation

## 2014-12-09 DIAGNOSIS — Z789 Other specified health status: Secondary | ICD-10-CM | POA: Insufficient documentation

## 2014-12-09 HISTORY — DX: Other specified health status: Z78.9

## 2014-12-25 DIAGNOSIS — O99012 Anemia complicating pregnancy, second trimester: Secondary | ICD-10-CM | POA: Insufficient documentation

## 2015-02-16 DIAGNOSIS — O24410 Gestational diabetes mellitus in pregnancy, diet controlled: Secondary | ICD-10-CM | POA: Insufficient documentation

## 2015-02-16 DIAGNOSIS — Z8632 Personal history of gestational diabetes: Secondary | ICD-10-CM | POA: Insufficient documentation

## 2015-03-03 DIAGNOSIS — F1911 Other psychoactive substance abuse, in remission: Secondary | ICD-10-CM | POA: Insufficient documentation

## 2020-07-24 ENCOUNTER — Emergency Department (HOSPITAL_COMMUNITY): Payer: Medicaid - Out of State

## 2020-07-24 ENCOUNTER — Emergency Department (HOSPITAL_COMMUNITY)
Admission: EM | Admit: 2020-07-24 | Discharge: 2020-07-24 | Disposition: A | Discharge status: Home / Self Care | Payer: Medicaid - Out of State | Attending: Emergency Medicine | Admitting: Emergency Medicine

## 2020-07-24 DIAGNOSIS — R109 Unspecified abdominal pain: Secondary | ICD-10-CM | POA: Diagnosis present

## 2020-07-24 DIAGNOSIS — Y92410 Unspecified street and highway as the place of occurrence of the external cause: Secondary | ICD-10-CM | POA: Insufficient documentation

## 2020-07-24 DIAGNOSIS — M545 Low back pain, unspecified: Secondary | ICD-10-CM | POA: Insufficient documentation

## 2020-07-24 DIAGNOSIS — R10817 Generalized abdominal tenderness: Secondary | ICD-10-CM | POA: Diagnosis not present

## 2020-07-24 LAB — CBC
HCT: 44.2 % (ref 36.0–46.0)
Hemoglobin: 14.3 g/dL (ref 12.0–15.0)
MCH: 29.2 pg (ref 26.0–34.0)
MCHC: 32.4 g/dL (ref 30.0–36.0)
MCV: 90.4 fL (ref 80.0–100.0)
Platelets: 215 10*3/uL (ref 150–400)
RBC: 4.89 MIL/uL (ref 3.87–5.11)
RDW: 15 % (ref 11.5–15.5)
WBC: 10.1 10*3/uL (ref 4.0–10.5)
nRBC: 0 % (ref 0.0–0.2)

## 2020-07-24 LAB — COMPREHENSIVE METABOLIC PANEL
ALT: 25 U/L (ref 0–44)
AST: 25 U/L (ref 15–41)
Albumin: 3.5 g/dL (ref 3.5–5.0)
Alkaline Phosphatase: 64 U/L (ref 38–126)
Anion gap: 10 (ref 5–15)
BUN: 9 mg/dL (ref 6–20)
CO2: 21 mmol/L — ABNORMAL LOW (ref 22–32)
Calcium: 8.9 mg/dL (ref 8.9–10.3)
Chloride: 103 mmol/L (ref 98–111)
Creatinine, Ser: 0.72 mg/dL (ref 0.44–1.00)
GFR, Estimated: >60 mL/min (ref >60)
Glucose, Bld: 91 mg/dL (ref 70–99)
Potassium: 4.1 mmol/L (ref 3.5–5.1)
Sodium: 134 mmol/L — ABNORMAL LOW (ref 135–145)
Total Bilirubin: 0.5 mg/dL (ref 0.3–1.2)
Total Protein: 6.8 g/dL (ref 6.5–8.1)

## 2020-07-24 LAB — POC URINE PREG, ED: Preg Test, Ur: NEGATIVE

## 2020-07-24 MED ORDER — METHOCARBAMOL 500 MG PO TABS: 500.0000 mg | ORAL_TABLET | Freq: Three times a day (TID) | ORAL | 0 refills | Status: DC | PRN

## 2020-07-24 MED ORDER — IOHEXOL 300 MG/ML  SOLN
90.0000 mL | Freq: Once | INTRAMUSCULAR | Status: AC | PRN
  Administered 2020-07-24: 90 mL via INTRAVENOUS

## 2020-07-24 MED ORDER — FENTANYL CITRATE (PF) 100 MCG/2ML IJ SOLN
50.0000 ug | Freq: Once | INTRAMUSCULAR | Status: AC
  Administered 2020-07-24: 50 ug via INTRAVENOUS
  Filled 2020-07-24: qty 2

## 2020-07-24 MED ORDER — KETOROLAC TROMETHAMINE 15 MG/ML IJ SOLN
15.0000 mg | Freq: Once | INTRAMUSCULAR | Status: AC
  Administered 2020-07-24: 15 mg via INTRAVENOUS
  Filled 2020-07-24: qty 1

## 2020-07-24 MED ORDER — NAPROXEN 500 MG PO TABS: 500.0000 mg | ORAL_TABLET | Freq: Two times a day (BID) | ORAL | 0 refills | Status: DC | PRN
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
## 2021-04-08 DIAGNOSIS — O26893 Other specified pregnancy related conditions, third trimester: Secondary | ICD-10-CM | POA: Insufficient documentation

## 2021-04-08 DIAGNOSIS — R109 Unspecified abdominal pain: Secondary | ICD-10-CM | POA: Insufficient documentation

## 2021-12-27 DIAGNOSIS — Z9049 Acquired absence of other specified parts of digestive tract: Secondary | ICD-10-CM | POA: Insufficient documentation

## 2021-12-27 DIAGNOSIS — K439 Ventral hernia without obstruction or gangrene: Secondary | ICD-10-CM | POA: Insufficient documentation

## 2021-12-27 DIAGNOSIS — R935 Abnormal findings on diagnostic imaging of other abdominal regions, including retroperitoneum: Secondary | ICD-10-CM | POA: Insufficient documentation

## 2021-12-27 DIAGNOSIS — T8332XA Displacement of intrauterine contraceptive device, initial encounter: Secondary | ICD-10-CM | POA: Insufficient documentation

## 2021-12-27 DIAGNOSIS — Z98891 History of uterine scar from previous surgery: Secondary | ICD-10-CM | POA: Insufficient documentation

## 2021-12-27 DIAGNOSIS — R1031 Right lower quadrant pain: Secondary | ICD-10-CM | POA: Insufficient documentation

## 2022-11-19 ENCOUNTER — Ambulatory Visit (HOSPITAL_COMMUNITY)
Admission: EM | Admit: 2022-11-19 | Discharge: 2022-11-19 | Disposition: A | Discharge status: Home / Self Care | Payer: No Typology Code available for payment source | Attending: Emergency Medicine | Admitting: Emergency Medicine

## 2022-11-19 ENCOUNTER — Encounter (HOSPITAL_COMMUNITY): Payer: Self-pay

## 2022-11-19 DIAGNOSIS — S61431A Puncture wound without foreign body of right hand, initial encounter: Secondary | ICD-10-CM

## 2022-11-19 DIAGNOSIS — I5022 Chronic systolic (congestive) heart failure: Secondary | ICD-10-CM

## 2022-11-19 DIAGNOSIS — Z23 Encounter for immunization: Secondary | ICD-10-CM

## 2022-11-19 DIAGNOSIS — J4521 Mild intermittent asthma with (acute) exacerbation: Secondary | ICD-10-CM

## 2022-11-19 HISTORY — DX: Heart failure, unspecified: I50.9

## 2022-11-19 LAB — CBC
HCT: 41.6 % (ref 36.0–46.0)
Hemoglobin: 13.7 g/dL (ref 12.0–15.0)
MCH: 28.8 pg (ref 26.0–34.0)
MCHC: 32.9 g/dL (ref 30.0–36.0)
MCV: 87.4 fL (ref 80.0–100.0)
Platelets: 212 10*3/uL (ref 150–400)
RBC: 4.76 MIL/uL (ref 3.87–5.11)
RDW: 14.8 % (ref 11.5–15.5)
WBC: 10.9 10*3/uL — ABNORMAL HIGH (ref 4.0–10.5)
nRBC: 0 % (ref 0.0–0.2)
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
## 2022-11-19 MED ORDER — FUROSEMIDE 80 MG PO TABS: 80.0000 mg | ORAL_TABLET | Freq: Every day | ORAL | 2 refills | Status: DC

## 2022-11-19 MED ORDER — TETANUS-DIPHTH-ACELL PERTUSSIS 5-2.5-18.5 LF-MCG/0.5 IM SUSY
0.5000 mL | PREFILLED_SYRINGE | Freq: Once | INTRAMUSCULAR | Status: AC
  Administered 2022-11-19: 0.5 mL via INTRAMUSCULAR

## 2022-11-19 MED ORDER — ALBUTEROL SULFATE HFA 108 (90 BASE) MCG/ACT IN AERS: 2.0000 | INHALATION_SPRAY | RESPIRATORY_TRACT | 0 refills | Status: DC | PRN

## 2022-11-19 MED ORDER — TETANUS-DIPHTH-ACELL PERTUSSIS 5-2.5-18.5 LF-MCG/0.5 IM SUSY
PREFILLED_SYRINGE | INTRAMUSCULAR | Status: AC
  Filled 2022-11-19: qty 0.5

## 2022-11-19 NOTE — Discharge Instructions (Signed)
Today you were evaluated for your shortness of breath and leg swelling, lungs are diminished but do not sound congested you are getting enough air therefore we will restart your medicine  Take Lasix daily as directed  You may use albuterol inhaler taking 2 puffs every 4-6 hours as needed for shortness of breath and wheezing  You have a follow-up appointment set up with pulmonology, ensure that she go to this  You have been given information to the cardiologist office, please reach out on Tuesday to schedule follow-up appointment for further management of your heart failure  Blood work has been obtained to check your levels, if any thing is of concern you will be notified and told what to do next  You have been given a tetanus injection here in the office due to the puncture in your hand which has been cleaned, you may cleanse at home with soap and water and cover with a nonstick bandage, may return for any signs of infection such as puslike drainage, redness or swelling  At any point if your shortness of breath worsens please go to the nearest emergency department for immediate evaluation

## 2022-11-19 NOTE — ED Triage Notes (Signed)
Patient has history of CHF on lasix. Patient  just moved here and out of lasix and inhalers. Having LL edema and SOB. Onset of symptoms 5 days ago.

## 2022-11-21 ENCOUNTER — Ambulatory Visit
Admission: EM | Admit: 2022-11-21 | Discharge: 2022-11-21 | Disposition: A | Discharge status: Home / Self Care | Payer: No Typology Code available for payment source | Attending: Nurse Practitioner | Admitting: Nurse Practitioner

## 2022-11-21 ENCOUNTER — Ambulatory Visit (INDEPENDENT_AMBULATORY_CARE_PROVIDER_SITE_OTHER): Payer: No Typology Code available for payment source

## 2022-11-21 DIAGNOSIS — R062 Wheezing: Secondary | ICD-10-CM

## 2022-11-21 DIAGNOSIS — I509 Heart failure, unspecified: Secondary | ICD-10-CM

## 2022-11-21 DIAGNOSIS — M7989 Other specified soft tissue disorders: Secondary | ICD-10-CM | POA: Diagnosis not present

## 2022-11-21 DIAGNOSIS — J4521 Mild intermittent asthma with (acute) exacerbation: Secondary | ICD-10-CM

## 2022-11-21 MED ORDER — ALBUTEROL SULFATE (2.5 MG/3ML) 0.083% IN NEBU
2.5000 mg | INHALATION_SOLUTION | Freq: Four times a day (QID) | RESPIRATORY_TRACT | 1 refills | Status: DC | PRN
Start: 2022-11-21 — End: 2024-01-22

## 2022-11-21 MED ORDER — FUROSEMIDE 40 MG PO TABS
40.0000 mg | ORAL_TABLET | Freq: Once | ORAL | Status: AC
  Administered 2022-11-21: 40 mg via ORAL

## 2022-11-21 MED ORDER — ALBUTEROL SULFATE HFA 108 (90 BASE) MCG/ACT IN AERS
1.0000 | INHALATION_SPRAY | Freq: Once | RESPIRATORY_TRACT | Status: AC
  Administered 2022-11-21: 2 via RESPIRATORY_TRACT

## 2022-11-21 MED ORDER — IPRATROPIUM-ALBUTEROL 0.5-2.5 (3) MG/3ML IN SOLN
3.0000 mL | Freq: Once | RESPIRATORY_TRACT | Status: AC
  Administered 2022-11-21: 3 mL via RESPIRATORY_TRACT
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
## 2022-11-21 NOTE — ED Provider Notes (Signed)
[facility]    CSN: [id number] Arrival date & time: 11/21/22  1008      History   Chief Complaint Chief Complaint  Patient presents with   Fatigue   Shortness of Breath    HPI Brooke Sandoval is a 37 y.o. female presents for evaluation of shortness of breath and wheezing.  Patient has a complex medical history including pregnancy-induced heart failure and asthma.  Patient reports she awoke today with some nasal congestion/sinus pressure with unproductive cough.  No sore throat or fevers.  She does endorse some wheezing and shortness of breath.  Patient states she has been out of her Lasix for 5 days and she is also out of her albuterol inhaler as well.  She was seen in urgent care on 5/26 for what appears to be an asthma exacerbation.  She was prescribed Lasix and albuterol but patient states she has not picked up as she is waiting for Medicaid to pay for it.  CBC was drawn at that time showing slightly elevated WBCs at 10.9 otherwise unremarkable.  No BMP or BNP was drawn.  She does not know her ejection fraction.  She does not weigh herself daily.  She does endorse 1 pillow orthopnea and states no change in this.  She also endorses lower extremity swelling.  She has not established with either pulmonologist or a cardiologist.  No chest pain.  No other concerns at this time.   Shortness of Breath Associated symptoms: cough and wheezing     Past Medical History:  Diagnosis Date   ADHD (attention deficit hyperactivity disorder)    Anxiety    CHF (congestive heart failure) (HCC)    Coagulation factor disorder (HCC)    DVT (deep venous thrombosis) (HCC)    Miscarriage     There are no problems to display for this patient.   Past Surgical History:  Procedure Laterality Date   CESAREAN SECTION      OB History     Gravida  1   Para      Term      Preterm      AB      Living         SAB      IAB      Ectopic      Multiple      Live Births                Home Medications    Prior to Admission medications   Medication Sig Start Date End Date Taking? Authorizing Provider  albuterol (PROVENTIL) (2.5 MG/3ML) 0.083% nebulizer solution Take 3 mLs (2.5 mg total) by nebulization every 6 (six) hours as needed for wheezing or shortness of breath. 11/21/22  Yes Radford Pax, NP  acetaminophen (TYLENOL) 500 MG tablet Take 1,000 mg by mouth every 6 (six) hours as needed for mild pain.    [provider]  albuterol (VENTOLIN HFA) 108 (90 Base) MCG/ACT inhaler Inhale 2 puffs into the lungs every 4 (four) hours as needed for wheezing or shortness of breath. 11/19/22   White, Adrienne R, NP  amphetamine-dextroamphetamine (ADDERALL) 30 MG tablet Take 30 mg by mouth 3 (three) times daily.    [provider]  enoxaparin (LOVENOX) 30 MG/0.3ML injection Inject 30 mg into the skin every 12 (twelve) hours.    [provider]  furosemide (LASIX) 40 MG tablet Take 1 tablet by mouth daily. 11/02/21   [provider]  furosemide (LASIX)  80 MG tablet Take 1 tablet (80 mg total) by mouth daily. 11/19/22   White, Elita Boone, NP  HYDROcodone-acetaminophen (NORCO) 5-325 MG per tablet Take 2 tablets by mouth every 4 (four) hours as needed. 11/06/13   Geoffery Lyons, MD  methocarbamol (ROBAXIN) 500 MG tablet Take 1 tablet (500 mg total) by mouth every 8 (eight) hours as needed for muscle spasms. 07/24/20   Petrucelli, Samantha R, PA-C  naproxen (NAPROSYN) 500 MG tablet Take 1 tablet (500 mg total) by mouth 2 (two) times daily as needed for moderate pain. 07/24/20   Petrucelli, Pleas Koch, PA-C  Prenatal Vit-Fe Fumarate-FA (PRENATAL MULTIVITAMIN) TABS tablet Take 1 tablet by mouth daily at 12 noon.    [provider]  VENTOLIN HFA 108 (90 Base) MCG/ACT inhaler INHALE 2 PUFFS INTO THE LUNGS EVERY 4 HOURS AS NEEDED FOR UP TO 30 DAYS 05/16/21   [provider]    Family History History reviewed. No pertinent family  history.  Social History Social History   Tobacco Use   Smoking status: Never    Passive exposure: Never   Smokeless tobacco: Never  Vaping Use   Vaping Use: Never used  Substance Use Topics   Alcohol use: No   Drug use: No     Allergies   Prednisone   Review of Systems Review of Systems  Constitutional:  Positive for fatigue.  HENT:  Positive for congestion, sinus pressure and sinus pain.   Respiratory:  Positive for cough, shortness of breath and wheezing.   Cardiovascular:  Positive for leg swelling.     Physical Exam Triage Vital Signs ED Triage Vitals  Enc Vitals Group     BP 11/21/22 1024 134/81     Pulse Rate 11/21/22 1021 96     Resp 11/21/22 1021 (!) 21     Temp 11/21/22 1021 98.6 F (37 C)     Temp src --      SpO2 11/21/22 1021 94 %     Weight --      Height --      Head Circumference --      Peak Flow --      Pain Score 11/21/22 1020 6     Pain Loc --      Pain Edu? --      Excl. in GC? --    No data found.  Updated Vital Signs BP 134/81   Pulse 96   Temp 98.6 F (37 C)   Resp (!) 21   Wt (!) 394 lb 1.6 oz (178.8 kg)   LMP 09/22/2022 (Approximate)   SpO2 94%   BMI 58.20 kg/m   Visual Acuity Right Eye Distance:   Left Eye Distance:   Bilateral Distance:    Right Eye Near:   Left Eye Near:    Bilateral Near:     Physical Exam Vitals and nursing note reviewed.  Constitutional:      General: She is not in acute distress.    Appearance: She is well-developed. She is obese. She is not ill-appearing, toxic-appearing or diaphoretic.  HENT:     Head: Normocephalic and atraumatic.     Right Ear: Tympanic membrane and ear canal normal.     Left Ear: Tympanic membrane and ear canal normal.     Nose: Congestion present.     Mouth/Throat:     Mouth: Mucous membranes are moist.     Pharynx: No oropharyngeal exudate or posterior oropharyngeal erythema.  Eyes:  Extraocular Movements: Extraocular movements intact.      Conjunctiva/sclera: Conjunctivae normal.     Pupils: Pupils are equal, round, and reactive to light.  Cardiovascular:     Rate and Rhythm: Normal rate and regular rhythm.     Heart sounds: Normal heart sounds.     Comments: +1-2 pitting edema to lower legs that extends to knee. Pulmonary:     Breath sounds: Wheezing present.  Skin:    General: Skin is warm and dry.  Neurological:     General: No focal deficit present.     Mental Status: She is alert and oriented to person, place, and time.  Psychiatric:        Mood and Affect: Mood normal.        Behavior: Behavior normal.      UC Treatments / Results  Labs (all labs ordered are listed, but only abnormal results are displayed) Labs Reviewed  BASIC METABOLIC PANEL    Comprehensive Metabolic Panel (CMP) Order: [id number] Component Ref Range & Units 8 mo ago  Sodium 135 - 145 mmol/L 139  Potassium 3.5 - 5.0 mmol/L 4.0  Chloride 98 - 108 mmol/L 107  Carbon Dioxide (CO2) 21 - 30 mmol/L 26  Urea Nitrogen (BUN) 7 - 20 mg/dL 7  Creatinine 0.4 - 1.0 mg/dL 0.8  Glucose 70 - 409 mg/dL 811  Comment: Interpretive Data: Above is the NONFASTING reference range.  Below are the FASTING reference ranges: NORMAL:      70-99 mg/dL PREDIABETES: 914-782 mg/dL DIABETES:    > 956 mg/dL  Calcium 8.7 - 21.3 mg/dL 8.9  AST (Aspartate Aminotransferase) 15 - 41 U/L 24  ALT (Alanine Aminotransferase) 10 - 39 U/L 29  Bilirubin, Total 0.4 - 1.5 mg/dL 0.4  Alk Phos (Alkaline Phosphatase) 24 - 110 U/L 55  Albumin 3.5 - 4.8 g/dL 3.4 Low   Protein, Total 6.2 - 8.1 g/dL 6.7  Anion Gap 3 - 12 mmol/L 6  BUN/CREA Ratio 6 - 27 9  Glomerular Filtration Rate (eGFR) mL/min/1.73sq m 97   Recent Results (from the past 2160 hour(s))  CBC     Status: Abnormal   Collection Time: 11/19/22  6:00 PM  Result Value Ref Range   WBC 10.9 (H) 4.0 - 10.5 K/uL   RBC 4.76 3.87 - 5.11 MIL/uL   Hemoglobin 13.7 12.0 - 15.0 g/dL   HCT 08.6 57.8 - 46.9  %   MCV 87.4 80.0 - 100.0 fL   MCH 28.8 26.0 - 34.0 pg   MCHC 32.9 30.0 - 36.0 g/dL   RDW 62.9 52.8 - 41.3 %   Platelets 212 150 - 400 K/uL   nRBC 0.0 0.0 - 0.2 %    Comment: Performed at [facility], [address], [place], [place] [address]    EKG   Radiology DG Chest 2 View  Result Date: 11/21/2022 CLINICAL DATA:  Cough, shortness of breath EXAM: CHEST - 2 VIEW COMPARISON:  07/24/2020 FINDINGS: The heart size and mediastinal contours are within normal limits. Both lungs are clear. The visualized skeletal structures are unremarkable. IMPRESSION: No active cardiopulmonary disease. Electronically Signed   By: Judie Petit.  Shick M.D.   On: 11/21/2022 11:13    Procedures Procedures (including critical care time)  Medications Ordered in UC Medications  furosemide (LASIX) tablet 40 mg (has no administration in time range)  albuterol (VENTOLIN HFA) 108 (90 Base) MCG/ACT inhaler 1-2 puff (has no administration in time range)  ipratropium-albuterol (DUONEB) 0.5-2.5 (3) MG/3ML  nebulizer solution 3 mL (3 mLs Nebulization Given 11/21/22 1059)    Initial Impression / Assessment and Plan / UC Course  I have reviewed the triage vital signs and the nursing notes.  Pertinent labs & imaging results that were available during my care of the patient were reviewed by me and considered in my medical decision making (see chart for details).  Clinical Course as of 11/21/22 1142  Tue Nov 21, 2022  1135 O2 after duoneb 97% on RA [JM]    Clinical Course User Index [JM] Radford Pax, NP    Patient wheezing resolved after DuoNeb.  O2 increased to 97% on room air.  Chest x-ray does not show cardiomegaly or fluid overload.  Patient was given 1 dose of Lasix 40 mg in clinic.  She will pick up her prescription for Lasix today when she gets her insurance information sorted out.  Did order albuterol inhaler for patient to take home as she does not have one currently and is again awaiting insurance to be  sorted out.  Will check BMP.  Do not feel BNP is indicated as it will not change treatment at this time as she has no orthopnea and swelling likely secondary to her being off her Lasix for 5 days. Nursing staff was able to get appointment for patient with a PCP to establish care on June 11.  Patient also is an appoint with pulmonology on June 13.  She was also encouraged to establish with cardiology, contact information provided for [facility] for nebulizer solution sent to pharmacy as patient reports she does have an home nebulizer machine. Discussed with patient importance of weighing herself daily Patient to follow-up with PCP at her scheduled appointment Strict ER precautions reviewed and patient verbalized understanding Final Clinical Impressions(s) / UC Diagnoses   Final diagnoses:  Wheezing  Mild intermittent asthma with acute exacerbation  Chronic congestive heart failure, unspecified heart failure type (HCC)  Leg swelling     Discharge Instructions      You were given a dose of Lasix in the clinic.  Please pick up your prescription for Lasix at your pharmacy and resume tomorrow.  Blood work was taken today and I will contact you with those results if abnormal. A prescription for nebulizer solution has been sent to your pharmacy and you also given a albuterol inhaler while in clinic.  Please use this as needed for wheezing/shortness of breath Please establish with cardiology for further evaluation and treatment of your heart failure Please follow-up with your PCP at scheduled appointment Please weigh yourself daily and keep a log to take your cardiologist or PCP Please go to the ER if you develop any worsening symptoms.  This includes but is not limited to unable to lay flat and breathe, worsening swelling, chest pain, shortness of breath, or any new concerns that arise     ED Prescriptions     Medication Sig Dispense Auth. Provider   albuterol (PROVENTIL) (2.5  MG/3ML) 0.083% nebulizer solution Take 3 mLs (2.5 mg total) by nebulization every 6 (six) hours as needed for wheezing or shortness of breath. 75 mL Radford Pax, NP      PDMP not reviewed this encounter.   Radford Pax, NP 11/21/22 [phone]

## 2022-11-21 NOTE — Discharge Instructions (Addendum)
You were given a dose of Lasix in the clinic.  Please pick up your prescription for Lasix at your pharmacy and resume tomorrow.  Blood work was taken today and I will contact you with those results if abnormal. A prescription for nebulizer solution has been sent to your pharmacy and you also given a albuterol inhaler while in clinic.  Please use this as needed for wheezing/shortness of breath Please establish with cardiology for further evaluation and treatment of your heart failure Please follow-up with your PCP at scheduled appointment Please weigh yourself daily and keep a log to take your cardiologist or PCP Please go to the ER if you develop any worsening symptoms.  This includes but is not limited to unable to lay flat and breathe, worsening swelling, chest pain, shortness of breath, or any new concerns that arise

## 2022-11-22 LAB — BASIC METABOLIC PANEL
BUN/Creatinine Ratio: 17 (ref 9–23)
BUN: 13 mg/dL (ref 6–20)
CO2: 18 mmol/L — ABNORMAL LOW (ref 20–29)
Calcium: 9.5 mg/dL (ref 8.7–10.2)
Chloride: 105 mmol/L (ref 96–106)
Creatinine, Ser: 0.78 mg/dL (ref 0.57–1.00)
Glucose: 103 mg/dL — ABNORMAL HIGH (ref 70–99)
Potassium: 5.1 mmol/L (ref 3.5–5.2)
Sodium: 145 mmol/L — ABNORMAL HIGH (ref 134–144)
eGFR: 100 mL/min/{1.73_m2} (ref >59)

## 2022-11-22 NOTE — ED Provider Notes (Signed)
[facility]    CSN: [id number] Arrival date & time: 11/19/22  1728      History   Chief Complaint Chief Complaint  Patient presents with   Cough   Asthma    HPI Brooke Sandoval is a 37 y.o. female.   Patient presents for evaluation of shortness of breath, wheezing and nonproductive cough present for 5 days.  Recently moved to the area and has run out of all medications, history of asthma, CHF, anemia.  No known sick contacts.  Tolerating food and liquids.  Has been experiencing lower extremity edema.  Does not weigh self daily. denies fever chills body aches, ear pain, sore throat.  While sitting in waiting room patient punctured the palm of the right hand with a recipe or scissors that she obtained from her child.  Bleeding has subsided.  Unsure of last tetanus.  Has full range of motion of the hand.  Denies numbness or tingling.  Past Medical History:  Diagnosis Date   ADHD (attention deficit hyperactivity disorder)    Anxiety    CHF (congestive heart failure) (HCC)    Coagulation factor disorder (HCC)    DVT (deep venous thrombosis) (HCC)    Miscarriage     There are no problems to display for this patient.   Past Surgical History:  Procedure Laterality Date   CESAREAN SECTION      OB History     Gravida  1   Para      Term      Preterm      AB      Living         SAB      IAB      Ectopic      Multiple      Live Births               Home Medications    Prior to Admission medications   Medication Sig Start Date End Date Taking? Authorizing Provider  albuterol (VENTOLIN HFA) 108 (90 Base) MCG/ACT inhaler Inhale 2 puffs into the lungs every 4 (four) hours as needed for wheezing or shortness of breath. 11/19/22  Yes Marnesha Gagen R, NP  furosemide (LASIX) 80 MG tablet Take 1 tablet (80 mg total) by mouth daily. 11/19/22  Yes Arletta Lumadue, Elita Boone, NP  acetaminophen (TYLENOL) 500 MG tablet Take 1,000 mg by mouth every 6 (six)  hours as needed for mild pain.    [provider]  albuterol (PROVENTIL) (2.5 MG/3ML) 0.083% nebulizer solution Take 3 mLs (2.5 mg total) by nebulization every 6 (six) hours as needed for wheezing or shortness of breath. 11/21/22   Radford Pax, NP  amphetamine-dextroamphetamine (ADDERALL) 30 MG tablet Take 30 mg by mouth 3 (three) times daily.    [provider]  enoxaparin (LOVENOX) 30 MG/0.3ML injection Inject 30 mg into the skin every 12 (twelve) hours.    [provider]  furosemide (LASIX) 40 MG tablet Take 1 tablet by mouth daily. 11/02/21   [provider]  HYDROcodone-acetaminophen (NORCO) 5-325 MG per tablet Take 2 tablets by mouth every 4 (four) hours as needed. 11/06/13   Geoffery Lyons, MD  methocarbamol (ROBAXIN) 500 MG tablet Take 1 tablet (500 mg total) by mouth every 8 (eight) hours as needed for muscle spasms. 07/24/20   Petrucelli, Samantha R, PA-C  naproxen (NAPROSYN) 500 MG tablet Take 1 tablet (500 mg total) by mouth 2 (two) times daily as needed for moderate pain.  07/24/20   Petrucelli, Pleas Koch, PA-C  Prenatal Vit-Fe Fumarate-FA (PRENATAL MULTIVITAMIN) TABS tablet Take 1 tablet by mouth daily at 12 noon.    [provider]  VENTOLIN HFA 108 (90 Base) MCG/ACT inhaler INHALE 2 PUFFS INTO THE LUNGS EVERY 4 HOURS AS NEEDED FOR UP TO 30 DAYS 05/16/21   [provider]    Family History History reviewed. No pertinent family history.  Social History Social History   Tobacco Use   Smoking status: Never    Passive exposure: Never   Smokeless tobacco: Never  Vaping Use   Vaping Use: Never used  Substance Use Topics   Alcohol use: No   Drug use: No     Allergies   Prednisone   Review of Systems Review of Systems Defer to HPI    Physical Exam Triage Vital Signs ED Triage Vitals  Enc Vitals Group     BP 11/19/22 1756 127/80     Pulse Rate 11/19/22 1756 86     Resp 11/19/22 1756 20     Temp 11/19/22 1756 98.2  F (36.8 C)     Temp Source 11/19/22 1756 Oral     SpO2 11/19/22 1756 95 %     Weight 11/19/22 1756 (!) 411 lb (186.4 kg)     Height 11/19/22 1756 5\' 9"  (1.753 m)     Head Circumference --      Peak Flow --      Pain Score 11/19/22 1754 0     Pain Loc --      Pain Edu? --      Excl. in GC? --    No data found.  Updated Vital Signs BP 127/80 (BP Location: Left Wrist)   Pulse 86   Temp 98.2 F (36.8 C) (Oral)   Resp 20   Ht 5\' 9"  (1.753 m)   Wt (!) 411 lb (186.4 kg)   LMP 09/22/2022 (Approximate)   SpO2 95%   Breastfeeding No   BMI 60.69 kg/m   Visual Acuity Right Eye Distance:   Left Eye Distance:   Bilateral Distance:    Right Eye Near:   Left Eye Near:    Bilateral Near:     Physical Exam Constitutional:      Appearance: Normal appearance.  Eyes:     Extraocular Movements: Extraocular movements intact.  Cardiovascular:     Rate and Rhythm: Normal rate and regular rhythm.     Pulses: Normal pulses.     Heart sounds: Normal heart sounds.  Pulmonary:     Effort: Pulmonary effort is normal.     Comments: Diminished in all lobes Musculoskeletal:     Right lower leg: 2+ Edema present.     Left lower leg: 2+ Edema present.  Skin:    Comments: Less than 0.5 cm puncture wound present to the palm of the left hand, bleeding subsided, 2+ radial pulse, full range of motion intact, sensation intact  Neurological:     Mental Status: She is alert and oriented to person, place, and time. Mental status is at baseline.      UC Treatments / Results  Labs (all labs ordered are listed, but only abnormal results are displayed) Labs Reviewed  CBC - Abnormal; Notable for the following components:      Result Value   WBC 10.9 (*)    All other components within normal limits    EKG   Radiology DG Chest 2 View  Result Date: 11/21/2022 CLINICAL DATA:  Cough, shortness of breath EXAM: CHEST - 2 VIEW COMPARISON:  07/24/2020 FINDINGS: The heart size and mediastinal  contours are within normal limits. Both lungs are clear. The visualized skeletal structures are unremarkable. IMPRESSION: No active cardiopulmonary disease. Electronically Signed   By: Judie Petit.  Shick M.D.   On: 11/21/2022 11:13    Procedures Procedures (including critical care time)  Medications Ordered in UC Medications  Tdap (BOOSTRIX) injection 0.5 mL (0.5 mLs Intramuscular Given 11/19/22 1811)    Initial Impression / Assessment and Plan / UC Course  I have reviewed the triage vital signs and the nursing notes.  Pertinent labs & imaging results that were available during my care of the patient were reviewed by me and considered in my medical decision making (see chart for details).  Mild intermittent asthma with acute exacerbation, chronic systolic congestive heart failure puncture wound of right hand without foreign body, initial encounter  Vital signs are stable patient is in no signs of distress nor toxic appearing, lungs are diminished but clear and breathing has normal effort therefore imaging deferred, swelling is present to the lower extremities however patient has been out of medication for 5 days, as she is stable at this time were managed outpatient and refilled medications, albuterol and Lasix sent to pharmacy, CBC obtain as patient endorses anemia in which she has had similar symptoms, if lab results below 7 she will be sent directly to the nearest emergency department, may start on iron per recommendations as, pulmonology follow-up appointment has been set up prior to discharge, given walking referral to cardiology, given strict precautions for any worsening breathing to for her to go to the nearest emergency department for immediate evaluation, wound to the hand has been cleansed she has been given an up-to-date tetanus, advised daily cleansing until healed with follow-up as needed for any signs of infection Final Clinical Impressions(s) / UC Diagnoses   Final diagnoses:  Mild  intermittent asthma with acute exacerbation  Chronic systolic congestive heart failure (HCC)     Discharge Instructions      Today you were evaluated for your shortness of breath and leg swelling, lungs are diminished but do not sound congested you are getting enough air therefore we will restart your medicine  Take Lasix daily as directed  You may use albuterol inhaler taking 2 puffs every 4-6 hours as needed for shortness of breath and wheezing  You have a follow-up appointment set up with pulmonology, ensure that she go to this  You have been given information to the cardiologist office, please reach out on Tuesday to schedule follow-up appointment for further management of your heart failure  Blood work has been obtained to check your levels, if any thing is of concern you will be notified and told what to do next  You have been given a tetanus injection here in the office due to the puncture in your hand which has been cleaned, you may cleanse at home with soap and water and cover with a nonstick bandage, may return for any signs of infection such as puslike drainage, redness or swelling  At any point if your shortness of breath worsens please go to the nearest emergency department for immediate evaluation   ED Prescriptions     Medication Sig Dispense Auth. Provider   furosemide (LASIX) 80 MG tablet Take 1 tablet (80 mg total) by mouth daily. 30 tablet Gabriellah Rabel R, NP   albuterol (VENTOLIN HFA) 108 (90 Base) MCG/ACT inhaler Inhale 2 puffs into  the lungs every 4 (four) hours as needed for wheezing or shortness of breath. 8 g Valinda Hoar, NP      PDMP not reviewed this encounter.   Valinda Hoar, [place] 11/22/22 [phone]

## 2022-12-05 ENCOUNTER — Encounter: Payer: No Typology Code available for payment source | Admitting: Family

## 2022-12-05 NOTE — Progress Notes (Signed)
  This encounter was created in error - please disregard. No show 

## 2022-12-07 ENCOUNTER — Institutional Professional Consult (permissible substitution): Payer: No Typology Code available for payment source | Admitting: Pulmonary Disease

## 2023-05-27 ENCOUNTER — Ambulatory Visit (HOSPITAL_COMMUNITY): Payer: No Typology Code available for payment source

## 2023-09-10 ENCOUNTER — Ambulatory Visit
Admission: EM | Admit: 2023-09-10 | Discharge: 2023-09-10 | Disposition: A | Discharge status: Home / Self Care | Attending: Family Medicine | Admitting: Family Medicine

## 2023-09-10 DIAGNOSIS — R110 Nausea: Secondary | ICD-10-CM

## 2023-09-10 DIAGNOSIS — N912 Amenorrhea, unspecified: Secondary | ICD-10-CM | POA: Diagnosis not present

## 2023-09-10 DIAGNOSIS — R1011 Right upper quadrant pain: Secondary | ICD-10-CM

## 2023-09-10 LAB — POCT URINE PREGNANCY: Preg Test, Ur: NEGATIVE

## 2023-09-10 MED ORDER — VITAMIN B-6 25 MG PO TABS: 25.0000 mg | ORAL_TABLET | Freq: Three times a day (TID) | ORAL | 0 refills | Status: DC

## 2023-09-10 NOTE — ED Provider Notes (Signed)
 [facility]    CSN: [id number] Arrival date & time: 09/10/23  1512      History   Chief Complaint Chief Complaint  Patient presents with   Possible Pregnancy    HPI Brooke Sandoval is a 38 y.o. female.    Possible Pregnancy Associated symptoms include abdominal pain.   She has been having some pain to the right lower abdomen for 2-3 days.  She does have a hernia in this location.  She is unable to sleep due to pain.  She states it is 6/7 in pain.  She has been nauseated.  No vomiting.  No fevers/chills.  She has not had much of an appetite.  She is always constipated, but no change.  No urinary symptoms.  She still has her gallbladder, but appendix has been removed.   She also has had several positive pregnancy tests at home.  Very faint lines.  Her LMP was 07/29/23.     Past Medical History:  Diagnosis Date   ADHD (attention deficit hyperactivity disorder)    Anxiety    CHF (congestive heart failure) (HCC)    Coagulation factor disorder (HCC)    DVT (deep venous thrombosis) (HCC)    Miscarriage     Patient Active Problem List   Diagnosis Date Noted   Abnormal CT of the abdomen 12/27/2021   History of appendectomy 12/27/2021   History of cesarean section 12/27/2021   Malpositioned IUD, initial encounter 12/27/2021   Right lower quadrant pain 12/27/2021   Ventral hernia without obstruction or gangrene 12/27/2021   Abdominal pain during pregnancy in third trimester 04/08/2021   History of substance abuse (HCC) 03/03/2015   Diet controlled gestational diabetes mellitus in third trimester 02/16/2015   Anemia during pregnancy in second trimester 12/25/2014   Asthma affecting pregnancy, antepartum 12/09/2014   Chronic hypertension during pregnancy, antepartum 12/09/2014   Poor intravenous access 12/09/2014   Poor dentition 10/10/2014   Anxiety 10/08/2014   Attention deficit hyperactivity disorder (ADHD), combined type 10/08/2014   High-risk pregnancy  in third trimester 10/08/2014   Previous cesarean delivery affecting pregnancy, antepartum 10/08/2014   Hx of pulmonary embolus during pregnancy 10/08/2014   Protein S deficiency affecting pregnancy, antepartum (HCC) 10/08/2014   Chlamydia infection affecting pregnancy 09/12/2014   BMI 50.0-59.9, adult (HCC) 08/28/2014   History of pre-eclampsia in prior pregnancy, currently pregnant in first trimester 08/28/2014    Past Surgical History:  Procedure Laterality Date   CESAREAN SECTION      OB History     Gravida  1   Para      Term      Preterm      AB      Living         SAB      IAB      Ectopic      Multiple      Live Births               Home Medications    Prior to Admission medications   Medication Sig Start Date End Date Taking? Authorizing Provider  acetaminophen (TYLENOL) 500 MG tablet Take 1,000 mg by mouth every 6 (six) hours as needed for mild pain.    [provider]  albuterol (PROVENTIL) (2.5 MG/3ML) 0.083% nebulizer solution Take 3 mLs (2.5 mg total) by nebulization every 6 (six) hours as needed for wheezing or shortness of breath. 11/21/22   Radford Pax, NP  albuterol (VENTOLIN HFA) 108 (90  Base) MCG/ACT inhaler Inhale 2 puffs into the lungs every 4 (four) hours as needed for wheezing or shortness of breath. 11/19/22   White, Adrienne R, NP  amphetamine-dextroamphetamine (ADDERALL) 30 MG tablet Take 30 mg by mouth 3 (three) times daily.    [provider]  enoxaparin (LOVENOX) 30 MG/0.3ML injection Inject 30 mg into the skin every 12 (twelve) hours.    [provider]  furosemide (LASIX) 40 MG tablet Take 1 tablet by mouth daily. 11/02/21   [provider]  furosemide (LASIX) 80 MG tablet Take 1 tablet (80 mg total) by mouth daily. 11/19/22   White, Elita Boone, NP  HYDROcodone-acetaminophen (NORCO) 5-325 MG per tablet Take 2 tablets by mouth every 4 (four) hours as needed. 11/06/13   Geoffery Lyons, MD   methocarbamol (ROBAXIN) 500 MG tablet Take 1 tablet (500 mg total) by mouth every 8 (eight) hours as needed for muscle spasms. 07/24/20   Petrucelli, Samantha R, PA-C  naproxen (NAPROSYN) 500 MG tablet Take 1 tablet (500 mg total) by mouth 2 (two) times daily as needed for moderate pain. 07/24/20   Petrucelli, Pleas Koch, PA-C  Prenatal Vit-Fe Fumarate-FA (PRENATAL MULTIVITAMIN) TABS tablet Take 1 tablet by mouth daily at 12 noon.    [provider]  VENTOLIN HFA 108 (90 Base) MCG/ACT inhaler INHALE 2 PUFFS INTO THE LUNGS EVERY 4 HOURS AS NEEDED FOR UP TO 30 DAYS 05/16/21   [provider]    Family History History reviewed. No pertinent family history.  Social History Social History   Tobacco Use   Smoking status: Never    Passive exposure: Never   Smokeless tobacco: Never  Vaping Use   Vaping status: Never Used  Substance Use Topics   Alcohol use: No   Drug use: No     Allergies   Prednisone   Review of Systems Review of Systems  Constitutional:  Positive for appetite change.  HENT: Negative.    Respiratory: Negative.    Cardiovascular: Negative.   Gastrointestinal:  Positive for abdominal pain and nausea.  Genitourinary:  Positive for menstrual problem.  Hematological: Negative.   Psychiatric/Behavioral: Negative.       Physical Exam Triage Vital Signs ED Triage Vitals  Encounter Vitals Group     BP 09/10/23 1523 135/62     Systolic BP Percentile --      Diastolic BP Percentile --      Pulse Rate 09/10/23 1523 89     Resp 09/10/23 1523 19     Temp 09/10/23 1523 98.2 F (36.8 C)     Temp Source 09/10/23 1523 Oral     SpO2 09/10/23 1523 98 %     Weight --      Height --      Head Circumference --      Peak Flow --      Pain Score 09/10/23 1521 6     Pain Loc --      Pain Education --      Exclude from Growth Chart --    No data found.  Updated Vital Signs BP 135/62 (BP Location: Right Arm)   Pulse 89   Temp 98.2 F (36.8 C)  (Oral)   Resp 19   LMP 07/29/2023 (Exact Date)   SpO2 98%   Visual Acuity Right Eye Distance:   Left Eye Distance:   Bilateral Distance:    Right Eye Near:   Left Eye Near:    Bilateral Near:  Physical Exam Constitutional:      General: She is not in acute distress.    Appearance: Normal appearance. She is obese. She is not ill-appearing or toxic-appearing.  HENT:     Mouth/Throat:     Mouth: Mucous membranes are moist.  Cardiovascular:     Rate and Rhythm: Normal rate and regular rhythm.  Pulmonary:     Effort: Pulmonary effort is normal.     Breath sounds: Normal breath sounds.  Abdominal:     Palpations: Abdomen is soft.     Tenderness: There is abdominal tenderness. There is no guarding or rebound.     Comments: TTP to the RUQ;  no TTP otherwise  Musculoskeletal:     Cervical back: Normal range of motion and neck supple.  Neurological:     Mental Status: She is alert.      UC Treatments / Results  Labs (all labs ordered are listed, but only abnormal results are displayed) Labs Reviewed  HCG, QUANTITATIVE, PREGNANCY  COMPREHENSIVE METABOLIC PANEL  CBC WITH DIFFERENTIAL/PLATELET  POCT URINE PREGNANCY   UPT negative  EKG   Radiology No results found.  Procedures Procedures (including critical care time)  Medications Ordered in UC Medications - No data to display  Initial Impression / Assessment and Plan / UC Course  I have reviewed the triage vital signs and the nursing notes.  Pertinent labs & imaging results that were available during my care of the patient were reviewed by me and considered in my medical decision making (see chart for details).   Final Clinical Impressions(s) / UC Diagnoses   Final diagnoses:  Amenorrhea  RUQ pain  Nausea without vomiting     Discharge Instructions      You were seen today for various issues.  Your urine pregnancy was negative here today, but I have ordered blood work as you have had positive  tests at home.  Given you upper abdominal pain, I have ordered blood work for evaluation.  You will see these results in mychart and you will be notified if there is anything abnormal.  In the mean time, if you have worsening pain, along with fever, chills, nausea, vomiting then go to the ER for evaluation.  If you continue with pain that is not severe, please follow up with your primary care provider for further testing.  I have sent out medication for nausea.     ED Prescriptions     Medication Sig Dispense Auth. Provider   pyridOXINE (VITAMIN B6) 25 MG tablet Take 1 tablet (25 mg total) by mouth in the morning, at noon, and at bedtime. 30 tablet Jannifer Franklin, MD      PDMP not reviewed this encounter.   Jannifer Franklin, MD 09/10/23 [phone]

## 2023-09-10 NOTE — ED Triage Notes (Signed)
 Pt presents with RLQ pain x 4 days. Denies any vaginal bleeding.   Pt states she has taken a few pregnancy test at home that have come up positive. States she wants "verification". Pt is suppose to be taking blood thinners and is concerned about interaction between pregnancy and medications.

## 2023-09-10 NOTE — Discharge Instructions (Signed)
 You were seen today for various issues.  Your urine pregnancy was negative here today, but I have ordered blood work as you have had positive tests at home.  Given you upper abdominal pain, I have ordered blood work for evaluation.  You will see these results in mychart and you will be notified if there is anything abnormal.  In the mean time, if you have worsening pain, along with fever, chills, nausea, vomiting then go to the ER for evaluation.  If you continue with pain that is not severe, please follow up with your primary care provider for further testing.  I have sent out medication for nausea.

## 2023-09-11 LAB — COMPREHENSIVE METABOLIC PANEL
ALT: 32 IU/L (ref 0–32)
AST: 25 IU/L (ref 0–40)
Albumin: 3.9 g/dL (ref 3.9–4.9)
Alkaline Phosphatase: 96 IU/L (ref 44–121)
BUN/Creatinine Ratio: 15 (ref 9–23)
BUN: 11 mg/dL (ref 6–20)
Bilirubin Total: 0.3 mg/dL (ref 0.0–1.2)
CO2: 25 mmol/L (ref 20–29)
Calcium: 9.1 mg/dL (ref 8.7–10.2)
Chloride: 105 mmol/L (ref 96–106)
Creatinine, Ser: 0.74 mg/dL (ref 0.57–1.00)
Globulin, Total: 2.8 g/dL (ref 1.5–4.5)
Glucose: 99 mg/dL (ref 70–99)
Potassium: 4.5 mmol/L (ref 3.5–5.2)
Sodium: 141 mmol/L (ref 134–144)
Total Protein: 6.7 g/dL (ref 6.0–8.5)
eGFR: 105 mL/min/{1.73_m2} (ref >59)

## 2023-09-11 LAB — CBC WITH DIFFERENTIAL/PLATELET
Basophils Absolute: 0.1 10*3/uL (ref 0.0–0.2)
Basos: 1 %
EOS (ABSOLUTE): 0.2 10*3/uL (ref 0.0–0.4)
Eos: 3 %
Hematocrit: 37.9 % (ref 34.0–46.6)
Hemoglobin: 12.3 g/dL (ref 11.1–15.9)
Immature Grans (Abs): 0 10*3/uL (ref 0.0–0.1)
Immature Granulocytes: 0 %
Lymphocytes Absolute: 2.4 10*3/uL (ref 0.7–3.1)
Lymphs: 28 %
MCH: 26.5 pg — ABNORMAL LOW (ref 26.6–33.0)
MCHC: 32.5 g/dL (ref 31.5–35.7)
MCV: 82 fL (ref 79–97)
Monocytes Absolute: 0.3 10*3/uL (ref 0.1–0.9)
Monocytes: 4 %
Neutrophils Absolute: 5.7 10*3/uL (ref 1.4–7.0)
Neutrophils: 64 %
Platelets: 266 10*3/uL (ref 150–450)
RBC: 4.64 x10E6/uL (ref 3.77–5.28)
RDW: 15.5 % — ABNORMAL HIGH (ref 11.7–15.4)
WBC: 8.7 10*3/uL (ref 3.4–10.8)

## 2023-09-13 ENCOUNTER — Telehealth: Admitting: Physician Assistant

## 2023-09-13 ENCOUNTER — Encounter: Payer: Self-pay | Admitting: Internal Medicine

## 2023-09-13 ENCOUNTER — Telehealth: Payer: Self-pay | Admitting: Nurse Practitioner

## 2023-09-13 ENCOUNTER — Telehealth: Payer: Self-pay | Admitting: Internal Medicine

## 2023-09-13 ENCOUNTER — Other Ambulatory Visit: Payer: Self-pay

## 2023-09-13 ENCOUNTER — Encounter (HOSPITAL_COMMUNITY): Payer: Self-pay | Admitting: Family Medicine

## 2023-09-13 ENCOUNTER — Inpatient Hospital Stay (HOSPITAL_COMMUNITY)
Admission: AD | Admit: 2023-09-13 | Discharge: 2023-09-13 | Disposition: A | Discharge status: Home / Self Care | Attending: Family Medicine | Admitting: Family Medicine

## 2023-09-13 DIAGNOSIS — O0281 Inappropriate change in quantitative human chorionic gonadotropin (hCG) in early pregnancy: Secondary | ICD-10-CM | POA: Diagnosis not present

## 2023-09-13 DIAGNOSIS — R109 Unspecified abdominal pain: Secondary | ICD-10-CM | POA: Diagnosis present

## 2023-09-13 DIAGNOSIS — O209 Hemorrhage in early pregnancy, unspecified: Secondary | ICD-10-CM | POA: Diagnosis present

## 2023-09-13 DIAGNOSIS — R7989 Other specified abnormal findings of blood chemistry: Secondary | ICD-10-CM | POA: Diagnosis not present

## 2023-09-13 DIAGNOSIS — M545 Low back pain, unspecified: Secondary | ICD-10-CM | POA: Diagnosis present

## 2023-09-13 DIAGNOSIS — Z3A00 Weeks of gestation of pregnancy not specified: Secondary | ICD-10-CM | POA: Insufficient documentation

## 2023-09-13 DIAGNOSIS — N939 Abnormal uterine and vaginal bleeding, unspecified: Secondary | ICD-10-CM

## 2023-09-13 DIAGNOSIS — Z3200 Encounter for pregnancy test, result unknown: Secondary | ICD-10-CM

## 2023-09-13 LAB — URINALYSIS, ROUTINE W REFLEX MICROSCOPIC
Bacteria, UA: NONE SEEN
Bilirubin Urine: NEGATIVE
Glucose, UA: NEGATIVE mg/dL
Ketones, ur: NEGATIVE mg/dL
Leukocytes,Ua: NEGATIVE
Nitrite: NEGATIVE
Protein, ur: NEGATIVE mg/dL
Specific Gravity, Urine: 1.021 (ref 1.005–1.030)
pH: 7 (ref 5.0–8.0)

## 2023-09-13 LAB — CBC
HCT: 37.7 % (ref 36.0–46.0)
Hemoglobin: 12 g/dL (ref 12.0–15.0)
MCH: 26.2 pg (ref 26.0–34.0)
MCHC: 31.8 g/dL (ref 30.0–36.0)
MCV: 82.3 fL (ref 80.0–100.0)
Platelets: 241 10*3/uL (ref 150–400)
RBC: 4.58 MIL/uL (ref 3.87–5.11)
RDW: 15.7 % — ABNORMAL HIGH (ref 11.5–15.5)
WBC: 9.8 10*3/uL (ref 4.0–10.5)
nRBC: 0 % (ref 0.0–0.2)

## 2023-09-13 LAB — HCG, QUANTITATIVE, PREGNANCY: hCG, Beta Chain, Quant, S: 1 m[IU]/mL (ref <5)

## 2023-09-13 LAB — SPECIMEN STATUS REPORT

## 2023-09-13 LAB — BETA HCG QUANT (REF LAB): hCG Quant: 7 m[IU]/mL

## 2023-09-13 NOTE — MAU Provider Note (Signed)
     S Ms. Brooke Sandoval is a 38 y.o. G2P0 patient who presents to MAU today with complaint of follow up from Urgent care seen on 3/17 for c/o possible pregnancy and vaginal bleeding with cramping. She had a negative UPT but hcg was elevated at 7 therefore was advised to f/u in MAU for repeat labs. Today she is here d/t follow up on labs and she is still experiencing lower back pain and cramping with vaginal bleeding which she reports may also be just a heavier menses but is experiencing fatigigued   Review of Systems  All other systems reviewed and are negative.  Unless otherwise noted in HPI   O BP (!) 140/71 (BP Location: Right Arm)   Pulse 83   Temp 97.8 F (36.6 C) (Oral)   Resp 19   Ht 5\' 9"  (1.753 m)   Wt (!) 179.4 kg   LMP 08/05/2023 (Exact Date)   SpO2 97%   BMI 58.39 kg/m  Physical Exam Vitals and nursing note reviewed.  Constitutional:      General: She is not in acute distress.    Appearance: She is obese. She is not ill-appearing.  HENT:     Head: Normocephalic.  Cardiovascular:     Rate and Rhythm: Normal rate.  Pulmonary:     Effort: Pulmonary effort is normal.     Breath sounds: Normal breath sounds.  Musculoskeletal:        General: Normal range of motion.     Cervical back: Normal range of motion.  Skin:    General: Skin is warm.  Neurological:     Mental Status: She is alert and oriented to person, place, and time.  Psychiatric:        Behavior: Behavior normal.     Lab Orders         hCG, quantitative, pregnancy         CBC         Urinalysis, Routine w reflex microscopic -Urine, Clean Catch      MDM: Low/Moderate  CBC: unremarkable Hcg quant U/A: Large blood ( Given vaginal bleeding- NM findings)  I have reviewed the patient chart and performed the physical exam . I have ordered & interpreted the lab results and discussed findings with the patient Medications ordered as stated below.  A/P as described below.  Counseling and education  provided and patient agreeable  with plan as described below. Verbalized understanding.    A Medical screening exam complete   P Discharge from MAU in stable condition Patient given the option of transfer to [facility] for further evaluation or seek care in outpatient facility of choice  List of options for follow-up given  Warning signs for worsening condition that would warrant emergency follow-up discussed Patient may return to MAU as needed   Colman Cater, NP 09/13/2023 6:33 PM

## 2023-09-13 NOTE — Telephone Encounter (Signed)
 Patient's quantitative HCG 7 from visit at urgent care on 09/10/2023. 0-5 is normal, this indicates likely positive pregnancy test via blood.  UPT at urgent care negative, however she has had multiple home positives per chart review.  Shew as having RUQ abdominal pain at time of visit, now experiencing low back pain as seen in chart via telehealth visit today.  Discussed case with Dr. Marlinda Mike who agrees that the best plan of action will be to have patient go to the maternity assessment unit for repeat HCG to confirm positive result due to low back pain/abdominal pain with questionable pregnancy status.   Attempted to reach patient x1, phone call goes straight to voicemail and voicemail is not set up. Attempted to call alternate contact (patient's mother) x1, number is inactive.   Will reach out via MyChart.

## 2023-09-13 NOTE — Telephone Encounter (Signed)
 Patient called following MAU visit today with results after ID verified x 2 identifiers. Results discussed with patient   Hcg level 1 and CBC - WNL Patient verbalized understanding and will follow up with her gyn or PCP  Marcell Barlow, MSN, [facility], Center for [facility]

## 2023-09-13 NOTE — MAU Note (Signed)
 Brooke Sandoval is a 38 y.o. at Unknown here in MAU reporting: she had a negative urine pregnancy test 3 days ago but had several positive UPT's at home.  States had blood work drawn at Urgent Care and told today she is pregnant via [employer] from [place]. Stevie Kern, NP.  Pt told her HCG level was 7 and needed follow up @ MAU for concern of VB and abdominal & back pain.  States has saturated one tampon today and the other tampons had small saturation.  States feels weak & fatigued.  LMP: 08/05/2023 Onset of complaint: 1 week ago Pain score: 8 Vitals:   09/13/23 1625  BP: (!) 140/71  Pulse: 83  Resp: 19  Temp: 97.8 F (36.6 C)  SpO2: 97%     FHT: NA  Lab orders placed from triage: UA

## 2023-09-13 NOTE — Progress Notes (Signed)
 Because of possible pregnancy, I feel your condition warrants further evaluation and I recommend that you be seen for a face to face visit.  Please contact your primary care physician practice to be seen. Many offices offer virtual options to be seen via video if you are not comfortable going in person to a medical facility at this time.  Unfortunately, the safest medications for back pain in early pregnancy is going to be Tylenol. You can also use a heating pad. If you have a bathtub and feel comfortable getting in and out of the tub safely, you could use Epsom salt soaks (put 2 cups of plain epsom salt in warm bath water to your liking and sit for 15 minutes).   We would want to truly confirm or rule out a pregnancy before any other treatment options would be considered.   NOTE: You will NOT be charged for this eVisit.  If you do not have a PCP, [facility] offers a free physician referral service available at [phone]. Our trained staff has the experience, knowledge and resources to put you in touch with a physician who is right for you.    If you are having a true medical emergency please call 911.   Your e-visit answers were reviewed by a board certified advanced clinical practitioner to complete your personal care plan.  Thank you for using e-Visits.

## 2023-12-17 ENCOUNTER — Ambulatory Visit (INDEPENDENT_AMBULATORY_CARE_PROVIDER_SITE_OTHER)

## 2023-12-17 ENCOUNTER — Ambulatory Visit
Admission: EM | Admit: 2023-12-17 | Discharge: 2023-12-17 | Disposition: A | Discharge status: Home / Self Care | Attending: Family Medicine | Admitting: Family Medicine

## 2023-12-17 ENCOUNTER — Ambulatory Visit: Payer: Self-pay | Admitting: Nurse Practitioner

## 2023-12-17 DIAGNOSIS — R051 Acute cough: Secondary | ICD-10-CM

## 2023-12-17 DIAGNOSIS — M7989 Other specified soft tissue disorders: Secondary | ICD-10-CM | POA: Diagnosis not present

## 2023-12-17 DIAGNOSIS — N926 Irregular menstruation, unspecified: Secondary | ICD-10-CM

## 2023-12-17 DIAGNOSIS — J209 Acute bronchitis, unspecified: Secondary | ICD-10-CM

## 2023-12-17 DIAGNOSIS — J45901 Unspecified asthma with (acute) exacerbation: Secondary | ICD-10-CM

## 2023-12-17 LAB — POCT URINE PREGNANCY: Preg Test, Ur: NEGATIVE

## 2023-12-17 MED ORDER — PROMETHAZINE-DM 6.25-15 MG/5ML PO SYRP: 5.0000 mL | ORAL_SOLUTION | Freq: Three times a day (TID) | ORAL | 0 refills | Status: DC | PRN

## 2023-12-17 MED ORDER — IPRATROPIUM-ALBUTEROL 0.5-2.5 (3) MG/3ML IN SOLN
3.0000 mL | Freq: Once | RESPIRATORY_TRACT | Status: AC
  Administered 2023-12-17: 3 mL via RESPIRATORY_TRACT

## 2023-12-17 MED ORDER — FLUTICASONE-SALMETEROL 115-21 MCG/ACT IN AERO
2.0000 | INHALATION_SPRAY | Freq: Two times a day (BID) | RESPIRATORY_TRACT | 0 refills | Status: DC
Start: 2023-12-17 — End: 2024-01-22

## 2023-12-17 MED ORDER — ALBUTEROL SULFATE HFA 108 (90 BASE) MCG/ACT IN AERS
1.0000 | INHALATION_SPRAY | Freq: Four times a day (QID) | RESPIRATORY_TRACT | 1 refills | Status: DC | PRN
Start: 2023-12-17 — End: 2024-05-02

## 2023-12-17 MED ORDER — ALBUTEROL SULFATE HFA 108 (90 BASE) MCG/ACT IN AERS
1.0000 | INHALATION_SPRAY | Freq: Four times a day (QID) | RESPIRATORY_TRACT | 1 refills | Status: DC | PRN
Start: 2023-12-17 — End: 2023-12-17

## 2023-12-17 MED ORDER — PROMETHAZINE-DM 6.25-15 MG/5ML PO SYRP
5.0000 mL | ORAL_SOLUTION | Freq: Three times a day (TID) | ORAL | 0 refills | Status: DC | PRN
Start: 2023-12-17 — End: 2023-12-17

## 2023-12-17 MED ORDER — FUROSEMIDE 40 MG PO TABS
40.0000 mg | ORAL_TABLET | Freq: Once | ORAL | Status: AC
  Administered 2023-12-17: 40 mg via ORAL

## 2023-12-17 NOTE — Discharge Instructions (Addendum)
 I have refilled your albuterol  inhaler and also your Advair steroid inhaler.  You may take Promethazine DM as needed for your cough.  Please note this medication will make you drowsy.  Do not drink alcohol or drive on this medication.  You were given 1 dose of Lasix  in the clinic.  Please contact cardiology listed below and make an appointment with them to establish care for management of your heart failure and any additional diuretic needs.  Please follow-up with your PCP as soon as possible.  Go to the ER if you develop any worsening symptoms prior to seeing your PCP or pulmonology.  Hope you feel better soon!

## 2023-12-17 NOTE — ED Triage Notes (Signed)
 Pt present with a cough that has been worsening x 8 days. Pt states she started feeling SOB. Pt has used her inhaler for relief.

## 2023-12-17 NOTE — ED Provider Notes (Addendum)
 [facility]    CSN: [id number] Arrival date & time: 12/17/23  1546      History   Chief Complaint Chief Complaint  Patient presents with   Cough   Shortness of Breath    HPI Brooke Sandoval is a 38 y.o. female  presents for evaluation of URI symptoms for 8 days. Patient reports associated symptoms of cough, congestion, wheezing and shortness of breath. Denies N/V/D, fevers, sore throat, ear pain, body aches. Patient does have a hx of asthma.  Has been using her albuterol  inhaler more than normal with temporary improvement.  States she was previously on Advair but ran out.  Patient is not an active smoker.    In addition she does have a history of CHF and reports she takes Lasix  as needed for lower extremity swelling.  Does endorse lower extremity swelling but denies orthopnea or CP.  She does not weigh herself regularly.  She states she has been out of her Lasix  for some time.  She does not know her ejection fraction.  Pt has taken cough medicine OTC for symptoms.  She also reports she has not had a period since her miscarriage earlier this year.  Pt has no other concerns at this time.    Cough Associated symptoms: shortness of breath and wheezing   Shortness of Breath Associated symptoms: cough and wheezing     Past Medical History:  Diagnosis Date   ADHD (attention deficit hyperactivity disorder)    Anxiety    CHF (congestive heart failure) (HCC)    Coagulation factor disorder (HCC)    DVT (deep venous thrombosis) (HCC)    Miscarriage     Patient Active Problem List   Diagnosis Date Noted   Abnormal CT of the abdomen 12/27/2021   History of appendectomy 12/27/2021   History of cesarean section 12/27/2021   Malpositioned IUD, initial encounter 12/27/2021   Right lower quadrant pain 12/27/2021   Ventral hernia without obstruction or gangrene 12/27/2021   Abdominal pain during pregnancy in third trimester 04/08/2021   History of substance abuse (HCC) 03/03/2015    Diet controlled gestational diabetes mellitus in third trimester 02/16/2015   Anemia during pregnancy in second trimester 12/25/2014   Asthma affecting pregnancy, antepartum 12/09/2014   Chronic hypertension during pregnancy, antepartum 12/09/2014   Poor intravenous access 12/09/2014   Poor dentition 10/10/2014   Anxiety 10/08/2014   Attention deficit hyperactivity disorder (ADHD), combined type 10/08/2014   High-risk pregnancy in third trimester 10/08/2014   Previous cesarean delivery affecting pregnancy, antepartum 10/08/2014   Hx of pulmonary embolus during pregnancy 10/08/2014   Protein S deficiency affecting pregnancy, antepartum (HCC) 10/08/2014   Chlamydia infection affecting pregnancy 09/12/2014   BMI 50.0-59.9, adult (HCC) 08/28/2014   History of pre-eclampsia in prior pregnancy, currently pregnant in first trimester 08/28/2014    Past Surgical History:  Procedure Laterality Date   CESAREAN SECTION      OB History     Gravida  2   Para      Term      Preterm      AB      Living         SAB      IAB      Ectopic      Multiple      Live Births               Home Medications    Prior to Admission medications   Medication Sig Start  Date End Date Taking? Authorizing Provider  fluticasone-salmeterol (ADVAIR HFA) 115-21 MCG/ACT inhaler Inhale 2 puffs into the lungs 2 (two) times daily. 12/17/23  Yes Edwar Coe, Jodi R, NP  acetaminophen  (TYLENOL ) 500 MG tablet Take 1,000 mg by mouth every 6 (six) hours as needed for mild pain.    [provider]  albuterol  (PROVENTIL ) (2.5 MG/3ML) 0.083% nebulizer solution Take 3 mLs (2.5 mg total) by nebulization every 6 (six) hours as needed for wheezing or shortness of breath. 11/21/22   Thamar Holik, Jodi R, NP  albuterol  (VENTOLIN  HFA) 108 (90 Base) MCG/ACT inhaler Inhale 1-2 puffs into the lungs every 6 (six) hours as needed for wheezing or shortness of breath. 12/17/23   Klare Criss, Jodi R, NP  Prenatal Vit-Fe  Fumarate-FA (PRENATAL MULTIVITAMIN) TABS tablet Take 1 tablet by mouth daily at 12 noon.    [provider]  promethazine-dextromethorphan (PROMETHAZINE-DM) 6.25-15 MG/5ML syrup Take 5 mLs by mouth 3 (three) times daily as needed for cough. 12/17/23   Cardin Nitschke, Jodi R, NP  pyridOXINE (VITAMIN B6) 25 MG tablet Take 1 tablet (25 mg total) by mouth in the morning, at noon, and at bedtime. 09/10/23   Darral Longs, MD    Family History History reviewed. No pertinent family history.  Social History Social History   Tobacco Use   Smoking status: Never    Passive exposure: Never   Smokeless tobacco: Never  Vaping Use   Vaping status: Never Used  Substance Use Topics   Alcohol use: No   Drug use: No     Allergies   Prednisone   Review of Systems Review of Systems  HENT:  Positive for congestion.   Respiratory:  Positive for cough, shortness of breath and wheezing.      Physical Exam Triage Vital Signs ED Triage Vitals  Encounter Vitals Group     BP 12/17/23 1554 129/65     Girls Systolic BP Percentile --      Girls Diastolic BP Percentile --      Boys Systolic BP Percentile --      Boys Diastolic BP Percentile --      Pulse Rate 12/17/23 1554 91     Resp 12/17/23 1554 19     Temp --      Temp Source 12/17/23 1554 Oral     SpO2 12/17/23 1554 93 %     Weight --      Height --      Head Circumference --      Peak Flow --      Pain Score 12/17/23 1552 0     Pain Loc --      Pain Education --      Exclude from Growth Chart --    No data found.  Updated Vital Signs BP 129/65 (BP Location: Left Arm)   Pulse 91   Temp 98.2 F (36.8 C) (Oral)   Resp 19   LMP 08/19/2023 (Exact Date)   SpO2 93%   Breastfeeding Unknown   Visual Acuity Right Eye Distance:   Left Eye Distance:   Bilateral Distance:    Right Eye Near:   Left Eye Near:    Bilateral Near:     Physical Exam Vitals and nursing note reviewed.  Constitutional:      General: She is not in acute  distress.    Appearance: She is well-developed. She is obese. She is not ill-appearing.  HENT:     Head: Normocephalic and atraumatic.  Right Ear: Tympanic membrane and ear canal normal.     Left Ear: Tympanic membrane and ear canal normal.     Nose: Congestion present.     Mouth/Throat:     Mouth: Mucous membranes are moist.     Pharynx: Oropharynx is clear. Uvula midline. No posterior oropharyngeal erythema.     Tonsils: No tonsillar exudate or tonsillar abscesses.   Eyes:     Conjunctiva/sclera: Conjunctivae normal.     Pupils: Pupils are equal, round, and reactive to light.    Cardiovascular:     Rate and Rhythm: Normal rate and regular rhythm.     Heart sounds: Normal heart sounds.  Pulmonary:     Effort: Pulmonary effort is normal.     Breath sounds: Wheezing present.   Musculoskeletal:     Cervical back: Normal range of motion and neck supple.     Right lower leg: 1+ Pitting Edema present.     Left lower leg: 1+ Pitting Edema present.  Lymphadenopathy:     Cervical: No cervical adenopathy.   Skin:    General: Skin is warm and dry.   Neurological:     General: No focal deficit present.     Mental Status: She is alert and oriented to person, place, and time.   Psychiatric:        Mood and Affect: Mood normal.        Behavior: Behavior normal.      UC Treatments / Results  Labs (all labs ordered are listed, but only abnormal results are displayed) Labs Reviewed  POCT URINE PREGNANCY    EKG   Radiology DG Chest 2 View Result Date: 12/17/2023 CLINICAL DATA:  Cough and shortness of breath EXAM: CHEST - 2 VIEW COMPARISON:  Chest radiograph dated 11/21/2022 FINDINGS: Normal lung volumes. No focal consolidations. No pleural effusion or pneumothorax. The heart size and mediastinal contours are within normal limits. No acute osseous abnormality. IMPRESSION: No acute cardiopulmonary process. Electronically Signed   By: Limin  Xu M.D.   On: 12/17/2023 16:48     Procedures Procedures (including critical care time)  Medications Ordered in UC Medications  ipratropium-albuterol  (DUONEB) 0.5-2.5 (3) MG/3ML nebulizer solution 3 mL (3 mLs Nebulization Given 12/17/23 1610)  furosemide  (LASIX ) tablet 40 mg (40 mg Oral Given 12/17/23 1654)    Initial Impression / Assessment and Plan / UC Course  I have reviewed the triage vital signs and the nursing notes.  Pertinent labs & imaging results that were available during my care of the patient were reviewed by me and considered in my medical decision making (see chart for details).  Clinical Course as of 12/17/23 1700  Mon Dec 17, 2023  1659 O2 96% after nebulizer [JM]    Clinical Course User Index [JM] Loreda Myla SAUNDERS, NP    Reviewed exam and symptoms with patient.  She reports improvement after nebulizer and wheezing resolved.  Discussed with her bronchitis with asthma exacerbation.  Did refill her albuterol  inhaler and also her Advair.  Although her allergy list does show prednisone she states she tolerates steroid inhalers without any issue.  Urine hCG negative.  Encouraged follow-up with gynecology.  Discussed her lower extremity swelling.  This appears to be chronic as it has been documented in previous urgent care visits as well.  She currently denies any orthopnea and her chest x-ray does not show any fluid overload or cardiomegaly.  She was given 1 dose of Lasix  in clinic today told her that she  needs to establish with cardiology for any further refills of her diuretic for monitoring specifically her electrolytes.  Reinforced weighing herself daily.  She declined offer her to establish her with a PCP and states she will call Medicaid to see who is covered.  Contact information for cardiology was provided and she was strongly encouraged to establish care for them given her history.  Advised her to see PCP as soon as she can and to go to the ER for any worsening symptoms that occur prior to her seeing her  PCP or cardiology, red flags reviewed and she verbalized understanding. Final Clinical Impressions(s) / UC Diagnoses   Final diagnoses:  Missed period  Moderate asthma with acute exacerbation, unspecified whether persistent  Acute cough  Swelling of lower extremity  Acute bronchitis, unspecified organism     Discharge Instructions      I have refilled your albuterol  inhaler and also your Advair steroid inhaler.  You may take Promethazine DM as needed for your cough.  Please note this medication will make you drowsy.  Do not drink alcohol or drive on this medication.  You were given 1 dose of Lasix  in the clinic.  Please contact cardiology listed below and make an appointment with them to establish care for management of your heart failure and any additional diuretic needs.  Please follow-up with your PCP as soon as possible.  Go to the ER if you develop any worsening symptoms prior to seeing your PCP or pulmonology.  Hope you feel better soon!     ED Prescriptions     Medication Sig Dispense Auth. Provider   albuterol  (VENTOLIN  HFA) 108 (90 Base) MCG/ACT inhaler Inhale 1-2 puffs into the lungs every 6 (six) hours as needed for wheezing or shortness of breath. 1 each Loreda Myla SAUNDERS, NP   fluticasone-salmeterol (ADVAIR HFA) 115-21 MCG/ACT inhaler Inhale 2 puffs into the lungs 2 (two) times daily. 1 each Dazhane Villagomez, Jodi R, NP   promethazine-dextromethorphan (PROMETHAZINE-DM) 6.25-15 MG/5ML syrup Take 5 mLs by mouth 3 (three) times daily as needed for cough. 118 mL Mckynlie Vanderslice, Jodi R, NP      PDMP not reviewed this encounter.   Loreda Myla SAUNDERS, NP 12/17/23 1701    Loreda Myla SAUNDERS, NP 12/17/23 [phone]

## 2024-01-07 ENCOUNTER — Telehealth: Admitting: Family Medicine

## 2024-01-07 DIAGNOSIS — R112 Nausea with vomiting, unspecified: Secondary | ICD-10-CM

## 2024-01-07 MED ORDER — ONDANSETRON 4 MG PO TBDP
4.0000 mg | ORAL_TABLET | Freq: Three times a day (TID) | ORAL | 0 refills | Status: DC | PRN
Start: 2024-01-07 — End: 2024-01-25

## 2024-01-07 NOTE — Patient Instructions (Addendum)
 Harlene Lesches, thank you for joining Chiquita CHRISTELLA Barefoot, NP for today's virtual visit.  While this provider is not your primary care provider (PCP), if your PCP is located in our provider database this encounter information will be shared with them immediately following your visit.   A [facility] MyChart account gives you access to today's visit and all your visits, tests, and labs performed at [facility]  click here if you don't have a [facility] MyChart account or go to [email or web address]  Consent: (Patient) Brooke Sandoval provided verbal consent for this virtual visit at the beginning of the encounter.  Current Medications:  Current Outpatient Medications:    ondansetron  (ZOFRAN -ODT) 4 MG disintegrating tablet, Take 1 tablet (4 mg total) by mouth every 8 (eight) hours as needed for nausea or vomiting., Disp: 20 tablet, Rfl: 0   acetaminophen  (TYLENOL ) 500 MG tablet, Take 1,000 mg by mouth every 6 (six) hours as needed for mild pain., Disp: , Rfl:    albuterol  (PROVENTIL ) (2.5 MG/3ML) 0.083% nebulizer solution, Take 3 mLs (2.5 mg total) by nebulization every 6 (six) hours as needed for wheezing or shortness of breath., Disp: 75 mL, Rfl: 1   albuterol  (VENTOLIN  HFA) 108 (90 Base) MCG/ACT inhaler, Inhale 1-2 puffs into the lungs every 6 (six) hours as needed for wheezing or shortness of breath., Disp: 1 each, Rfl: 1   fluticasone -salmeterol (ADVAIR HFA) 115-21 MCG/ACT inhaler, Inhale 2 puffs into the lungs 2 (two) times daily., Disp: 1 each, Rfl: 0   Prenatal Vit-Fe Fumarate-FA (PRENATAL MULTIVITAMIN) TABS tablet, Take 1 tablet by mouth daily at 12 noon., Disp: , Rfl:    promethazine -dextromethorphan (PROMETHAZINE -DM) 6.25-15 MG/5ML syrup, Take 5 mLs by mouth 3 (three) times daily as needed for cough., Disp: 118 mL, Rfl: 0   pyridOXINE (VITAMIN B6) 25 MG tablet, Take 1 tablet (25 mg total) by mouth in the morning, at noon, and at bedtime., Disp: 30 tablet, Rfl: 0    Medications ordered in this encounter:  Meds ordered this encounter  Medications   ondansetron  (ZOFRAN -ODT) 4 MG disintegrating tablet    Sig: Take 1 tablet (4 mg total) by mouth every 8 (eight) hours as needed for nausea or vomiting.    Dispense:  20 tablet    Refill:  0    Supervising Provider:   LAMPTEY, PHILIP O [[id number]]     *If you need refills on other medications prior to your next appointment, please contact your pharmacy*  Follow-Up: Call back or seek an in-person evaluation if the symptoms worsen or if the condition fails to improve as anticipated.  [facility] [phone]  Other Instructions Nausea and Vomiting, Adult Nausea is feeling that you have an upset stomach and that you are about to vomit. Vomiting is when food in your stomach forcefully comes out of your mouth. Vomiting can make you feel weak. If you vomit, or if you are not able to drink enough fluids, you may not have enough water in your body (get dehydrated). If you do not have enough water in your body, you may: Feel tired. Feel thirsty. Have a dry mouth. Have cracked lips. Pee (urinate) less often. Older adults and people with other diseases or a weak body defense system (immune system) are at higher risk for not having enough water in the body. If you feel like you may vomit or you vomit, it is important to follow instructions from your doctor about how to take care of yourself. Follow these  instructions at home: Watch your symptoms for any changes. Tell your doctor about them. Eating and drinking     Take an ORS (oral rehydration solution). This is a drink that is sold at pharmacies and stores. Drink clear fluids in small amounts as you are able, such as: Water. Ice chips. Fruit juice that has water added (diluted fruit juice). Low-calorie sports drinks. Eat bland, easy-to-digest foods in small amounts as you are able, such as: Bananas. Applesauce. Rice. Low-fat (lean)  meats. Toast. Crackers. Avoid drinking fluids that have a lot of sugar or caffeine in them. This includes energy drinks, sports drinks, and soda. Avoid alcohol. Avoid spicy or fatty foods. General instructions Take over-the-counter and prescription medicines only as told by your doctor. Drink enough fluid to keep your pee (urine) pale yellow. Wash your hands often with soap and water for at least 20 seconds. If you cannot use soap and water, use hand sanitizer. Make sure that everyone in your home washes their hands well and often. Rest at home until you feel better. Watch your condition for any changes. Take slow and deep breaths when you feel like you may vomit. Keep all follow-up visits. Contact a doctor if: Your symptoms get worse. You have new symptoms. You have a fever. You cannot drink fluids without vomiting. You feel like you may vomit for more than 2 days. You feel light-headed or dizzy. You have a headache. You have muscle cramps. You have a rash. You have pain while peeing. Get help right away if: You have pain in your chest, neck, arm, or jaw. You feel very weak or you faint. You vomit again and again. You have vomit that is bright red or looks like black coffee grounds. You have bloody or black poop (stools) or poop that looks like tar. You have a very bad headache, a stiff neck, or both. You have very bad pain, cramping, or bloating in your belly (abdomen). You have trouble breathing. You are breathing very quickly. Your heart is beating very quickly. Your skin feels cold and clammy. You feel confused. You have signs of losing too much water in your body, such as: Dark pee, very little pee, or no pee. Cracked lips. Dry mouth. Sunken eyes. Sleepiness. Weakness. These symptoms may be an emergency. Get help right away. Call 911. Do not wait to see if the symptoms will go away. Do not drive yourself to the hospital. Summary Nausea is feeling that you have  an upset stomach and that you are about to vomit. Vomiting is when food in your stomach comes out of your mouth. Follow instructions from your doctor about eating and drinking. Take over-the-counter and prescription medicines only as told by your doctor. Contact your doctor if your symptoms get worse or you have new symptoms. Keep all follow-up visits. This information is not intended to replace advice given to you by your health care provider. Make sure you discuss any questions you have with your health care provider. Document Revised: 12/17/2020 Document Reviewed: 12/17/2020 Elsevier Patient Education  2024 [employer]   If you have been instructed to have an in-person evaluation today at a local Urgent Care facility, please use the link below. It will take you to a list of all of our available [facility], including address, phone number and hours of operation. Please do not delay care.  [facility]  If you or a family member do not have a primary care provider, use the  link below to schedule a visit and establish care. When you choose a [facility] primary care physician or advanced practice provider, you gain a long-term partner in health. Find a Primary Care Provider  Learn more about [facility] in-office and virtual care options: [facility] - Get Care Now

## 2024-01-07 NOTE — Progress Notes (Signed)
 Virtual Visit Consent   Leverne Tessler, you are scheduled for a virtual visit with a [facility] provider today. Just as with appointments in the office, your consent must be obtained to participate. Your consent will be active for this visit and any virtual visit you may have with one of our providers in the next 365 days. If you have a MyChart account, a copy of this consent can be sent to you electronically.  As this is a virtual visit, video technology does not allow for your provider to perform a traditional examination. This may limit your provider's ability to fully assess your condition. If your provider identifies any concerns that need to be evaluated in person or the need to arrange testing (such as labs, EKG, etc.), we will make arrangements to do so. Although advances in technology are sophisticated, we cannot ensure that it will always work on either your end or our end. If the connection with a video visit is poor, the visit may have to be switched to a telephone visit. With either a video or telephone visit, we are not always able to ensure that we have a secure connection.  By engaging in this virtual visit, you consent to the provision of healthcare and authorize for your insurance to be billed (if applicable) for the services provided during this visit. Depending on your insurance coverage, you may receive a charge related to this service.  I need to obtain your verbal consent now. Are you willing to proceed with your visit today? Brooke Sandoval has provided verbal consent on 01/07/2024 for a virtual visit (video or telephone). Chiquita CHRISTELLA Barefoot, NP  Date: 01/07/2024 12:33 PM   Virtual Visit via Video Note   I, Chiquita CHRISTELLA Barefoot, connected with  Brooke Sandoval  ([id number], [birth year]) on 01/07/24 at 12:30 PM EDT by a video-enabled telemedicine application and verified that I am speaking with the correct person using two identifiers.  Location: Patient: Virtual Visit Location Patient:  Home Provider: Virtual Visit Location Provider: Home Office   I discussed the limitations of evaluation and management by telemedicine and the availability of in person appointments. The patient expressed understanding and agreed to proceed.    History of Present Illness: Brooke Sandoval is a 38 y.o. who identifies as a female who was assigned female at birth, and is being seen today for gi upset  Vomiting started 3 days ago- nausea with it Denies chest pain, shortness of breath, fevers, chills, diarrhea, blood in emesis. Last episode- one hour ago- unable to keep fluids down Children had symptoms as well last week- they did better with OTC nausea meds. She has also tried OTC nausea med without relief    Problems:  Patient Active Problem List   Diagnosis Date Noted   Abnormal CT of the abdomen 12/27/2021   History of appendectomy 12/27/2021   History of cesarean section 12/27/2021   Malpositioned IUD, initial encounter 12/27/2021   Right lower quadrant pain 12/27/2021   Ventral hernia without obstruction or gangrene 12/27/2021   Abdominal pain during pregnancy in third trimester 04/08/2021   History of substance abuse (HCC) 03/03/2015   Diet controlled gestational diabetes mellitus in third trimester 02/16/2015   Anemia during pregnancy in second trimester 12/25/2014   Asthma affecting pregnancy, antepartum 12/09/2014   Chronic hypertension during pregnancy, antepartum 12/09/2014   Poor intravenous access 12/09/2014   Poor dentition 10/10/2014   Anxiety 10/08/2014   Attention deficit hyperactivity disorder (ADHD), combined type 10/08/2014   High-risk pregnancy  in third trimester 10/08/2014   Previous cesarean delivery affecting pregnancy, antepartum 10/08/2014   Hx of pulmonary embolus during pregnancy 10/08/2014   Protein S deficiency affecting pregnancy, antepartum (HCC) 10/08/2014   Chlamydia infection affecting pregnancy 09/12/2014   BMI 50.0-59.9, adult (HCC) 08/28/2014    History of pre-eclampsia in prior pregnancy, currently pregnant in first trimester 08/28/2014    Allergies:  Allergies  Allergen Reactions   Prednisone Swelling   Medications:  Current Outpatient Medications:    acetaminophen  (TYLENOL ) 500 MG tablet, Take 1,000 mg by mouth every 6 (six) hours as needed for mild pain., Disp: , Rfl:    albuterol  (PROVENTIL ) (2.5 MG/3ML) 0.083% nebulizer solution, Take 3 mLs (2.5 mg total) by nebulization every 6 (six) hours as needed for wheezing or shortness of breath., Disp: 75 mL, Rfl: 1   albuterol  (VENTOLIN  HFA) 108 (90 Base) MCG/ACT inhaler, Inhale 1-2 puffs into the lungs every 6 (six) hours as needed for wheezing or shortness of breath., Disp: 1 each, Rfl: 1   fluticasone -salmeterol (ADVAIR HFA) 115-21 MCG/ACT inhaler, Inhale 2 puffs into the lungs 2 (two) times daily., Disp: 1 each, Rfl: 0   Prenatal Vit-Fe Fumarate-FA (PRENATAL MULTIVITAMIN) TABS tablet, Take 1 tablet by mouth daily at 12 noon., Disp: , Rfl:    promethazine -dextromethorphan (PROMETHAZINE -DM) 6.25-15 MG/5ML syrup, Take 5 mLs by mouth 3 (three) times daily as needed for cough., Disp: 118 mL, Rfl: 0   pyridOXINE (VITAMIN B6) 25 MG tablet, Take 1 tablet (25 mg total) by mouth in the morning, at noon, and at bedtime., Disp: 30 tablet, Rfl: 0  Observations/Objective: Patient is well-developed, well-nourished in no acute distress.  Resting comfortably  at home.  Head is normocephalic, atraumatic.  No labored breathing.  Speech is clear and coherent with logical content.  Patient is alert and oriented at baseline.    Assessment and Plan:   1. Nausea and vomiting, unspecified vomiting type (Primary)  - ondansetron  (ZOFRAN -ODT) 4 MG disintegrating tablet; Take 1 tablet (4 mg total) by mouth every 8 (eight) hours as needed for nausea or vomiting.  Dispense: 20 tablet; Refill: 0  -hydrate -rest -follow up if not improving with zofran    Reviewed side effects, risks and benefits of  medication.    Patient acknowledged agreement and understanding of the plan.   Past Medical, Surgical, Social History, Allergies, and Medications have been Reviewed.    Follow Up Instructions: I discussed the assessment and treatment plan with the patient. The patient was provided an opportunity to ask questions and all were answered. The patient agreed with the plan and demonstrated an understanding of the instructions.  A copy of instructions were sent to the patient via MyChart unless otherwise noted below.    The patient was advised to call back or seek an in-person evaluation if the symptoms worsen or if the condition fails to improve as anticipated.    Chiquita CHRISTELLA Barefoot, NP

## 2024-01-08 ENCOUNTER — Encounter: Payer: Self-pay | Admitting: Family Medicine

## 2024-01-13 ENCOUNTER — Encounter (INDEPENDENT_AMBULATORY_CARE_PROVIDER_SITE_OTHER): Payer: Self-pay

## 2024-01-13 ENCOUNTER — Encounter (HOSPITAL_COMMUNITY): Payer: Self-pay | Admitting: Obstetrics & Gynecology

## 2024-01-13 ENCOUNTER — Inpatient Hospital Stay (HOSPITAL_COMMUNITY)
Admission: AD | Admit: 2024-01-13 | Discharge: 2024-01-13 | Disposition: A | Discharge status: Home / Self Care | Attending: Obstetrics & Gynecology | Admitting: Obstetrics & Gynecology

## 2024-01-13 DIAGNOSIS — D649 Anemia, unspecified: Secondary | ICD-10-CM

## 2024-01-13 DIAGNOSIS — O09291 Supervision of pregnancy with other poor reproductive or obstetric history, first trimester: Secondary | ICD-10-CM | POA: Diagnosis not present

## 2024-01-13 DIAGNOSIS — D6859 Other primary thrombophilia: Secondary | ICD-10-CM | POA: Insufficient documentation

## 2024-01-13 DIAGNOSIS — Z86711 Personal history of pulmonary embolism: Secondary | ICD-10-CM | POA: Insufficient documentation

## 2024-01-13 DIAGNOSIS — O99119 Other diseases of the blood and blood-forming organs and certain disorders involving the immune mechanism complicating pregnancy, unspecified trimester: Secondary | ICD-10-CM

## 2024-01-13 DIAGNOSIS — R42 Dizziness and giddiness: Secondary | ICD-10-CM | POA: Diagnosis not present

## 2024-01-13 DIAGNOSIS — R110 Nausea: Secondary | ICD-10-CM | POA: Diagnosis not present

## 2024-01-13 DIAGNOSIS — O26891 Other specified pregnancy related conditions, first trimester: Secondary | ICD-10-CM | POA: Diagnosis present

## 2024-01-13 DIAGNOSIS — O99111 Other diseases of the blood and blood-forming organs and certain disorders involving the immune mechanism complicating pregnancy, first trimester: Secondary | ICD-10-CM | POA: Insufficient documentation

## 2024-01-13 DIAGNOSIS — O3680X0 Pregnancy with inconclusive fetal viability, not applicable or unspecified: Secondary | ICD-10-CM

## 2024-01-13 DIAGNOSIS — Z3A00 Weeks of gestation of pregnancy not specified: Secondary | ICD-10-CM

## 2024-01-13 LAB — COMPREHENSIVE METABOLIC PANEL WITH GFR
ALT: 15 U/L (ref 0–44)
AST: 17 U/L (ref 15–41)
Albumin: 3.1 g/dL — ABNORMAL LOW (ref 3.5–5.0)
Alkaline Phosphatase: 54 U/L (ref 38–126)
Anion gap: 7 (ref 5–15)
BUN: 9 mg/dL (ref 6–20)
CO2: 26 mmol/L (ref 22–32)
Calcium: 8.7 mg/dL — ABNORMAL LOW (ref 8.9–10.3)
Chloride: 103 mmol/L (ref 98–111)
Creatinine, Ser: 0.74 mg/dL (ref 0.44–1.00)
GFR, Estimated: >60 mL/min (ref >60)
Glucose, Bld: 127 mg/dL — ABNORMAL HIGH (ref 70–99)
Potassium: 3.8 mmol/L (ref 3.5–5.1)
Sodium: 136 mmol/L (ref 135–145)
Total Bilirubin: 0.3 mg/dL (ref 0.0–1.2)
Total Protein: 6.5 g/dL (ref 6.5–8.1)

## 2024-01-13 LAB — CBC
HCT: 37.5 % (ref 36.0–46.0)
Hemoglobin: 11.6 g/dL — ABNORMAL LOW (ref 12.0–15.0)
MCH: 25.2 pg — ABNORMAL LOW (ref 26.0–34.0)
MCHC: 30.9 g/dL (ref 30.0–36.0)
MCV: 81.3 fL (ref 80.0–100.0)
Platelets: 233 K/uL (ref 150–400)
RBC: 4.61 MIL/uL (ref 3.87–5.11)
RDW: 16.7 % — ABNORMAL HIGH (ref 11.5–15.5)
WBC: 9.2 K/uL (ref 4.0–10.5)
nRBC: 0 % (ref 0.0–0.2)

## 2024-01-13 LAB — URINALYSIS, ROUTINE W REFLEX MICROSCOPIC
Bilirubin Urine: NEGATIVE
Glucose, UA: NEGATIVE mg/dL
Hgb urine dipstick: NEGATIVE
Ketones, ur: NEGATIVE mg/dL
Leukocytes,Ua: NEGATIVE
Nitrite: NEGATIVE
Protein, ur: NEGATIVE mg/dL
Specific Gravity, Urine: 1.021 (ref 1.005–1.030)
pH: 5 (ref 5.0–8.0)

## 2024-01-13 LAB — POCT PREGNANCY, URINE
Preg Test, Ur: POSITIVE — AB
Preg Test, Ur: POSITIVE — AB

## 2024-01-13 LAB — HCG, QUANTITATIVE, PREGNANCY: hCG, Beta Chain, Quant, S: 10669 m[IU]/mL — ABNORMAL HIGH (ref <5)

## 2024-01-13 MED ORDER — ENOXAPARIN SODIUM 60 MG/0.6ML IJ SOSY
45.0000 mg | PREFILLED_SYRINGE | Freq: Two times a day (BID) | INTRAMUSCULAR | Status: DC
  Administered 2024-01-13: 45 mg via SUBCUTANEOUS
  Filled 2024-01-13 (×4): qty 0.6

## 2024-01-13 MED ORDER — ONDANSETRON 4 MG PO TBDP
8.0000 mg | ORAL_TABLET | Freq: Once | ORAL | Status: AC
  Administered 2024-01-13: 8 mg via ORAL
  Filled 2024-01-13: qty 2

## 2024-01-13 MED ORDER — POLYETHYLENE GLYCOL 3350 17 GM/SCOOP PO POWD: 17.0000 g | Freq: Every day | ORAL | 2 refills | Status: DC | PRN

## 2024-01-13 MED ORDER — ENOXAPARIN SODIUM 60 MG/0.6ML IJ SOSY: 45.0000 mg | PREFILLED_SYRINGE | Freq: Two times a day (BID) | INTRAMUSCULAR | 9 refills | Status: DC

## 2024-01-13 MED ORDER — IRON (FERROUS SULFATE) 325 (65 FE) MG PO TABS: 1.0000 | ORAL_TABLET | ORAL | 3 refills | Status: DC

## 2024-01-13 MED ORDER — PRENATAL PLUS 27-1 MG PO TABS: 1.0000 | ORAL_TABLET | Freq: Every day | ORAL | 11 refills | Status: DC

## 2024-01-13 MED ORDER — ENOXAPARIN SODIUM 60 MG/0.6ML IJ SOSY: 60.0000 mg | PREFILLED_SYRINGE | Freq: Two times a day (BID) | INTRAMUSCULAR | Status: DC

## 2024-01-13 NOTE — MAU Note (Signed)
..  Brooke Sandoval is a 38 y.o. at Unknown here in MAU reporting: x10 positive home pregnancy test last week. She reports that for the last 4 four weeks she has had nausea and vomiting. She has been dizzy and unable to eat. Denies VB or abdominal pain. States that she has not had a period since may.  LMP: sometime in May  Pain score: 0 Vitals:   01/13/24 1704  BP: (!) 91/55  Pulse: 91  Resp: (!) 98  Temp: 98.4 F (36.9 C)  SpO2: 100%     Lab orders placed from triage:   UPT

## 2024-01-13 NOTE — Discharge Instructions (Signed)

## 2024-01-13 NOTE — MAU Provider Note (Cosign Needed Addendum)
 Dizzy    S Ms. Brooke Sandoval is a 38 y.o. [phone] pregnant female at Unknown who presents to MAU today with complaint of dizziness in setting of +HPT. Pt states had N/V for [redacted] weeks along with family members with poor PO intake.  Has felt dizzy. She states took pregnancy test last week and positive.  Denies VB or abdominal pain.  She is unsure of her last period but believes it was in May sometime.    Receives care at [facility]. Prenatal records reviewed. Pt with hx of 5 CS, Protein S def, and Hx of PE during Pregnancy.  Pt not currently on AC.   Pertinent items noted in HPI and remainder of comprehensive ROS otherwise negative.   O BP (!) 91/55 (BP Location: Right Arm)   Pulse 91   Temp 98.4 F (36.9 C) (Oral)   Resp 19   Ht 5' 9 (1.753 m)   Wt (!) 177.9 kg   LMP 08/19/2023 (Exact Date)   SpO2 100%   BMI 57.93 kg/m  Physical Exam Vitals and nursing note reviewed.  Constitutional:      General: She is not in acute distress.    Appearance: Normal appearance. She is obese. She is not ill-appearing.  HENT:     Head: Normocephalic and atraumatic.     Right Ear: External ear normal.     Left Ear: External ear normal.     Nose: Nose normal. No congestion.     Mouth/Throat:     Mouth: Mucous membranes are moist.     Pharynx: Oropharynx is clear.  Eyes:     Extraocular Movements: Extraocular movements intact.     Conjunctiva/sclera: Conjunctivae normal.  Cardiovascular:     Rate and Rhythm: Normal rate and regular rhythm.     Heart sounds: Normal heart sounds.  Pulmonary:     Effort: Pulmonary effort is normal. No respiratory distress.     Breath sounds: Normal breath sounds.  Abdominal:     General: Abdomen is flat. There is no distension.     Palpations: Abdomen is soft.     Tenderness: There is no abdominal tenderness.  Musculoskeletal:     Cervical back: Normal range of motion.     Right lower leg: Edema present.     Left lower leg: Edema present.     Comments: Chronic  unchanged 2+ pitting edema bilaterally   Skin:    General: Skin is warm and dry.  Neurological:     Mental Status: She is alert and oriented to person, place, and time. Mental status is at baseline.     Motor: No weakness.     Gait: Gait normal.  Psychiatric:        Mood and Affect: Mood normal.        Behavior: Behavior normal.      MDM: MAU Course: Upreg positive hCG collected  Orthostatics negative  UA noninfectious and w/o ketones   CMP reassuring CBC hgb 11.6, MCV 81.3  Will start patient on Lovenox  45mg  BID (0.5mg /kg per 24hrs for ppx per pharmacy recommendations) given hx of Protein S Def and hx of PE in 2008 pregnancy. First dose given today. Pt also given Zofran  as she had stated she had not taken it given thought unsafe in pregnancy. Pt stable for discharge.   AP #Pregnancy unknown location #Nausea c/b dizziness  - ensure proper hydration #Protein S deficiency #Hx of PE in prior pregnancy - Lovenox  45mg  BID sent   Discharge from  MAU in stable condition with strict/usual precautions Follow up at desired OBGYN as scheduled for ongoing prenatal care  Allergies as of 01/13/2024       Reactions   Prednisone Swelling        Medication List     TAKE these medications    acetaminophen  500 MG tablet Commonly known as: TYLENOL  Take 1,000 mg by mouth every 6 (six) hours as needed for mild pain.   albuterol  (2.5 MG/3ML) 0.083% nebulizer solution Commonly known as: PROVENTIL  Take 3 mLs (2.5 mg total) by nebulization every 6 (six) hours as needed for wheezing or shortness of breath.   albuterol  108 (90 Base) MCG/ACT inhaler Commonly known as: VENTOLIN  HFA Inhale 1-2 puffs into the lungs every 6 (six) hours as needed for wheezing or shortness of breath.   enoxaparin  60 MG/0.6ML injection Commonly known as: LOVENOX  Inject 0.45 mLs (45 mg total) into the skin every 12 (twelve) hours.   fluticasone -salmeterol 115-21 MCG/ACT inhaler Commonly known as: Advair  HFA Inhale 2 puffs into the lungs 2 (two) times daily.   Iron  (Ferrous Sulfate ) 325 (65 Fe) MG Tabs Take 1 tablet by mouth every other day.   ondansetron  4 MG disintegrating tablet Commonly known as: ZOFRAN -ODT Take 1 tablet (4 mg total) by mouth every 8 (eight) hours as needed for nausea or vomiting.   polyethylene glycol powder 17 GM/SCOOP powder Commonly known as: MiraLax  Take 17 g by mouth daily as needed for mild constipation, moderate constipation or severe constipation.   prenatal multivitamin Tabs tablet Take 1 tablet by mouth daily at 12 noon.   prenatal vitamin w/FE, FA 27-1 MG Tabs tablet Take 1 tablet by mouth daily at 12 noon.   promethazine -dextromethorphan 6.25-15 MG/5ML syrup Commonly known as: PROMETHAZINE -DM Take 5 mLs by mouth 3 (three) times daily as needed for cough.   pyridOXINE 25 MG tablet Commonly known as: VITAMIN B6 Take 1 tablet (25 mg total) by mouth in the morning, at noon, and at bedtime.        Jhonny Augustin BROCKS, MD 01/13/2024 7:40 PM

## 2024-01-22 ENCOUNTER — Inpatient Hospital Stay (HOSPITAL_COMMUNITY)

## 2024-01-22 ENCOUNTER — Other Ambulatory Visit: Payer: Self-pay

## 2024-01-22 ENCOUNTER — Inpatient Hospital Stay (HOSPITAL_COMMUNITY)
Admission: AD | Admit: 2024-01-22 | Discharge: 2024-01-22 | Disposition: A | Discharge status: Home / Self Care | Payer: Self-pay | Attending: Family Medicine | Admitting: Family Medicine

## 2024-01-22 ENCOUNTER — Encounter (HOSPITAL_COMMUNITY): Payer: Self-pay | Admitting: *Deleted

## 2024-01-22 DIAGNOSIS — Z3A01 Less than 8 weeks gestation of pregnancy: Secondary | ICD-10-CM

## 2024-01-22 DIAGNOSIS — O23591 Infection of other part of genital tract in pregnancy, first trimester: Secondary | ICD-10-CM | POA: Diagnosis not present

## 2024-01-22 DIAGNOSIS — O209 Hemorrhage in early pregnancy, unspecified: Secondary | ICD-10-CM | POA: Diagnosis not present

## 2024-01-22 DIAGNOSIS — R1084 Generalized abdominal pain: Secondary | ICD-10-CM | POA: Insufficient documentation

## 2024-01-22 DIAGNOSIS — R109 Unspecified abdominal pain: Secondary | ICD-10-CM | POA: Diagnosis present

## 2024-01-22 DIAGNOSIS — O34219 Maternal care for unspecified type scar from previous cesarean delivery: Secondary | ICD-10-CM | POA: Diagnosis not present

## 2024-01-22 DIAGNOSIS — O2621 Pregnancy care for patient with recurrent pregnancy loss, first trimester: Secondary | ICD-10-CM | POA: Diagnosis not present

## 2024-01-22 DIAGNOSIS — R0789 Other chest pain: Secondary | ICD-10-CM | POA: Diagnosis present

## 2024-01-22 DIAGNOSIS — B9689 Other specified bacterial agents as the cause of diseases classified elsewhere: Secondary | ICD-10-CM | POA: Insufficient documentation

## 2024-01-22 DIAGNOSIS — Z8679 Personal history of other diseases of the circulatory system: Secondary | ICD-10-CM

## 2024-01-22 DIAGNOSIS — O09291 Supervision of pregnancy with other poor reproductive or obstetric history, first trimester: Secondary | ICD-10-CM | POA: Diagnosis not present

## 2024-01-22 DIAGNOSIS — O99411 Diseases of the circulatory system complicating pregnancy, first trimester: Secondary | ICD-10-CM | POA: Insufficient documentation

## 2024-01-22 DIAGNOSIS — Z113 Encounter for screening for infections with a predominantly sexual mode of transmission: Secondary | ICD-10-CM | POA: Diagnosis present

## 2024-01-22 DIAGNOSIS — I509 Heart failure, unspecified: Secondary | ICD-10-CM | POA: Insufficient documentation

## 2024-01-22 DIAGNOSIS — Z86718 Personal history of other venous thrombosis and embolism: Secondary | ICD-10-CM | POA: Diagnosis not present

## 2024-01-22 LAB — HCG, QUANTITATIVE, PREGNANCY: hCG, Beta Chain, Quant, S: 40717 m[IU]/mL — ABNORMAL HIGH (ref <5)

## 2024-01-22 LAB — CBC
HCT: 36.9 % (ref 36.0–46.0)
Hemoglobin: 11.4 g/dL — ABNORMAL LOW (ref 12.0–15.0)
MCH: 25.6 pg — ABNORMAL LOW (ref 26.0–34.0)
MCHC: 30.9 g/dL (ref 30.0–36.0)
MCV: 82.9 fL (ref 80.0–100.0)
Platelets: 203 K/uL (ref 150–400)
RBC: 4.45 MIL/uL (ref 3.87–5.11)
RDW: 18.1 % — ABNORMAL HIGH (ref 11.5–15.5)
WBC: 8.5 K/uL (ref 4.0–10.5)
nRBC: 0 % (ref 0.0–0.2)

## 2024-01-22 LAB — URINALYSIS, ROUTINE W REFLEX MICROSCOPIC
Bilirubin Urine: NEGATIVE
Glucose, UA: NEGATIVE mg/dL
Hgb urine dipstick: NEGATIVE
Ketones, ur: NEGATIVE mg/dL
Leukocytes,Ua: NEGATIVE
Nitrite: NEGATIVE
Protein, ur: NEGATIVE mg/dL
Specific Gravity, Urine: 1.024 (ref 1.005–1.030)
pH: 6 (ref 5.0–8.0)

## 2024-01-22 LAB — WET PREP, GENITAL
Sperm: NONE SEEN
Trich, Wet Prep: NONE SEEN
WBC, Wet Prep HPF POC: <10 (ref <10)
Yeast Wet Prep HPF POC: NONE SEEN

## 2024-01-22 LAB — BRAIN NATRIURETIC PEPTIDE: B Natriuretic Peptide: 17.7 pg/mL (ref 0.0–100.0)

## 2024-01-22 MED ORDER — SCOPOLAMINE 1 MG/3DAYS TD PT72: 1.0000 | MEDICATED_PATCH | TRANSDERMAL | 2 refills | Status: DC

## 2024-01-22 MED ORDER — METRONIDAZOLE 500 MG PO TABS: 500.0000 mg | ORAL_TABLET | Freq: Two times a day (BID) | ORAL | 0 refills | Status: AC

## 2024-01-22 MED ORDER — ENOXAPARIN SODIUM 60 MG/0.6ML IJ SOSY: 60.0000 mg | PREFILLED_SYRINGE | INTRAMUSCULAR | 9 refills | Status: DC

## 2024-01-22 MED ORDER — ASPIRIN 81 MG PO CHEW: 81.0000 mg | CHEWABLE_TABLET | Freq: Every day | ORAL | 11 refills | Status: DC

## 2024-01-22 MED ORDER — DOCUSATE SODIUM 100 MG PO CAPS: 100.0000 mg | ORAL_CAPSULE | Freq: Two times a day (BID) | ORAL | 2 refills | Status: DC

## 2024-01-22 MED ORDER — FUROSEMIDE 40 MG PO TABS: 40.0000 mg | ORAL_TABLET | Freq: Every day | ORAL | 11 refills | Status: DC

## 2024-01-22 MED ORDER — SENNA 8.6 MG PO TABS: 1.0000 | ORAL_TABLET | Freq: Every evening | ORAL | 0 refills | Status: DC | PRN

## 2024-01-22 MED ORDER — DOXYLAMINE-PYRIDOXINE 10-10 MG PO TBEC: 1.0000 | DELAYED_RELEASE_TABLET | Freq: Four times a day (QID) | ORAL | 2 refills | Status: DC | PRN

## 2024-01-22 MED ORDER — PROMETHAZINE HCL 6.25 MG/5ML PO SOLN: 6.2500 mg | Freq: Four times a day (QID) | ORAL | 1 refills | Status: DC | PRN

## 2024-01-22 MED ORDER — SCOPOLAMINE 1 MG/3DAYS TD PT72
1.0000 | MEDICATED_PATCH | Freq: Once | TRANSDERMAL | Status: DC
  Administered 2024-01-22: 1.5 mg via TRANSDERMAL
  Filled 2024-01-22: qty 1

## 2024-01-22 NOTE — Progress Notes (Signed)
 [place] and verbal d/c instructions given by Dr Eldonna and pt voiced understanding. Pt then d/c home by provider.

## 2024-01-22 NOTE — MAU Provider Note (Addendum)
 Chief Complaint: Abdominal Pain   Event Date/Time   First Provider Initiated Contact with Patient 01/22/24 1809      SUBJECTIVE HPI: Brooke Sandoval is a 38 y.o. [id number] at Unknown by LMP who presents to maternity admissions reporting vaginal bleeding and abdominal pain.  Reports of vaginal spotting started 3 days ago, has not increased in frequency with only a few drops in pantiliners.  Also endorses dysuria and generalized abdominal pain/discomfort.  Pain is a dull achy feeling specifically right lower quadrant with no point tenderness, no radiation.  Reports has irregular bowel movements every 9 to 14 days with straining and does not take MiraLAX . Endorses intermittent shortness of breath with exertion, reportedly at baseline. Reports not taking lasix  for about 3 weeks now.   History of ventral hernia and SBO does not see a gastroenterologist, history of CHF on as needed Lasix  does not see cardiologist  Protein S deficiency and history of DVTs, currently anticoagulated on Lovenox  45 mg twice daily  She denies  vaginal itching/burning, h/a, dizziness, n/v, or fever/chills.     Past Medical History:  Diagnosis Date   ADHD (attention deficit hyperactivity disorder)    Anxiety    CHF (congestive heart failure) (HCC)    after admission for vetnal hernia   Coagulation factor disorder (HCC)    DVT (deep venous thrombosis) (HCC)    Miscarriage    Past Surgical History:  Procedure Laterality Date   APPENDECTOMY     CESAREAN SECTION     TONSILLECTOMY     VENTRAL HERNIA REPAIR     Social History   Socioeconomic History   Marital status: Single    Spouse name: Not on file   Number of children: Not on file   Years of education: Not on file   Highest education level: Not on file  Occupational History   Not on file  Tobacco Use   Smoking status: Never    Passive exposure: Never   Smokeless tobacco: Never  Vaping Use   Vaping status: Never Used  Substance and Sexual Activity    Alcohol use: No   Drug use: No   Sexual activity: Yes    Birth control/protection: None  Other Topics Concern   Not on file  Social History Narrative   Not on file   Social Drivers of Health   Financial Resource Strain: Not on file  Food Insecurity: Not on file  Transportation Needs: Not on file  Physical Activity: Not on file  Stress: Not on file  Social Connections: Not on file  Intimate Partner Violence: Not on file   No current facility-administered medications on file prior to encounter.   Current Outpatient Medications on File Prior to Encounter  Medication Sig Dispense Refill   acetaminophen  (TYLENOL ) 500 MG tablet Take 1,000 mg by mouth every 6 (six) hours as needed for mild pain.     fluticasone -salmeterol (ADVAIR HFA) 115-21 MCG/ACT inhaler Inhale 2 puffs into the lungs 2 (two) times daily. 1 each 0   Iron , Ferrous Sulfate , 325 (65 Fe) MG TABS Take 1 tablet by mouth every other day. 30 tablet 3   Prenatal Vit-Fe Fumarate-FA (PRENATAL MULTIVITAMIN) TABS tablet Take 1 tablet by mouth daily at 12 noon.     prenatal vitamin w/FE, FA (PRENATAL 1 + 1) 27-1 MG TABS tablet Take 1 tablet by mouth daily at 12 noon. 30 tablet 11   albuterol  (PROVENTIL ) (2.5 MG/3ML) 0.083% nebulizer solution Take 3 mLs (2.5 mg total) by nebulization every  6 (six) hours as needed for wheezing or shortness of breath. 75 mL 1   albuterol  (VENTOLIN  HFA) 108 (90 Base) MCG/ACT inhaler Inhale 1-2 puffs into the lungs every 6 (six) hours as needed for wheezing or shortness of breath. 1 each 1   ondansetron  (ZOFRAN -ODT) 4 MG disintegrating tablet Take 1 tablet (4 mg total) by mouth every 8 (eight) hours as needed for nausea or vomiting. 20 tablet 0   polyethylene glycol powder (MIRALAX ) 17 GM/SCOOP powder Take 17 g by mouth daily as needed for mild constipation, moderate constipation or severe constipation. 255 g 2   promethazine -dextromethorphan (PROMETHAZINE -DM) 6.25-15 MG/5ML syrup Take 5 mLs by mouth 3  (three) times daily as needed for cough. 118 mL 0   pyridOXINE  (VITAMIN B6) 25 MG tablet Take 1 tablet (25 mg total) by mouth in the morning, at noon, and at bedtime. (Patient not taking: Reported on 01/22/2024) 30 tablet 0   Allergies  Allergen Reactions   Prednisone Swelling    ROS:  Pertinent positives/negatives listed above.  I have reviewed patient's Past Medical Hx, Surgical Hx, Family Hx, Social Hx, medications and allergies.   Physical Exam  Patient Vitals for the past 24 hrs:  BP Temp Temp src Pulse Resp SpO2 Height Weight  01/22/24 1738 137/76 98.2 F (36.8 C) Oral 84 19 98 % -- --  01/22/24 1733 -- -- -- -- -- -- 5' 9 (1.753 m) (!) 178.5 kg   Constitutional: Well-developed, obese female in no acute distress.  Cardiovascular: normal rate Respiratory: normal effort GI: Obese abd soft, mildly diffuse tenderness on right side. Pos BS x 4 MS: Extremities nontender, bilateral 3 + pitting edema to mid tibia, normal ROM Neurologic: Alert and oriented x 4.  GU: Neg CVAT.   Doppler: 130 on OB US   LAB RESULTS Results for orders placed or performed during the hospital encounter of 01/22/24 (from the past 24 hours)  Wet prep, genital     Status: Abnormal   Collection Time: 01/22/24  5:53 PM   Specimen: PATH Cytology Cervicovaginal Ancillary Only  Result Value Ref Range   Yeast Wet Prep HPF POC NONE SEEN NONE SEEN   Trich, Wet Prep NONE SEEN NONE SEEN   Clue Cells Wet Prep HPF POC PRESENT (A) NONE SEEN   WBC, Wet Prep HPF POC <10 <10   Sperm NONE SEEN   Urinalysis, Routine w reflex microscopic -Urine, Clean Catch     Status: Abnormal   Collection Time: 01/22/24  6:01 PM  Result Value Ref Range   Color, Urine YELLOW YELLOW   APPearance HAZY (A) CLEAR   Specific Gravity, Urine 1.024 1.005 - 1.030   pH 6.0 5.0 - 8.0   Glucose, UA NEGATIVE NEGATIVE mg/dL   Hgb urine dipstick NEGATIVE NEGATIVE   Bilirubin Urine NEGATIVE NEGATIVE   Ketones, ur NEGATIVE NEGATIVE mg/dL    Protein, ur NEGATIVE NEGATIVE mg/dL   Nitrite NEGATIVE NEGATIVE   Leukocytes,Ua NEGATIVE NEGATIVE  hCG, quantitative, pregnancy     Status: Abnormal   Collection Time: 01/22/24  7:20 PM  Result Value Ref Range   hCG, Beta Chain, Quant, S 40,717 (H) <5 mIU/mL  CBC     Status: Abnormal   Collection Time: 01/22/24  7:20 PM  Result Value Ref Range   WBC 8.5 4.0 - 10.5 K/uL   RBC 4.45 3.87 - 5.11 MIL/uL   Hemoglobin 11.4 (L) 12.0 - 15.0 g/dL   HCT 63.0 63.9 - 53.9 %   MCV 82.9  80.0 - 100.0 fL   MCH 25.6 (L) 26.0 - 34.0 pg   MCHC 30.9 30.0 - 36.0 g/dL   RDW 81.8 (H) 88.4 - 84.4 %   Platelets 203 150 - 400 K/uL   nRBC 0.0 0.0 - 0.2 %       IMAGING US  OB LESS THAN 14 WEEKS WITH OB TRANSVAGINAL Result Date: 01/22/2024 CLINICAL DATA:  Cramps, spotting EXAM: OBSTETRIC <14 WK US  AND TRANSVAGINAL OB US  TECHNIQUE: Both transabdominal and transvaginal ultrasound examinations were performed for complete evaluation of the gestation as well as the maternal uterus, adnexal regions, and pelvic cul-de-sac. Transvaginal technique was performed to assess early pregnancy. COMPARISON:  None Available. FINDINGS: Intrauterine gestational sac: Single Yolk sac:  Visualized. Embryo:  Visualized. Cardiac Activity: Visualized. Heart Rate: 138 bpm MSD:   Mm    w     d CRL:  12.4 mm   7 w   3 d                  US  EDC: 09/06/2024 Subchorionic hemorrhage:  None visualized. Maternal uterus/adnexae: No adnexal mass or free fluid. IMPRESSION: Seven week 3 day intrauterine pregnancy. Fetal heart rate 130 beats per minute. No acute maternal findings. Electronically Signed   By: Franky Crease M.D.   On: 01/22/2024 19:47    MDM: Vaginal Bleeding: Likely secondary to implantation bleeding. Ectopic work up started given vaginal spotting with generalized abdominal cramps and confirmed pregnancy with unknown location. OB US  found 7-week 3-day IUP with no subchorionic hemorrhage. Given obstetric history it is also very possibly early  pregnancy loss.  Also consider subchorionic hemorrhage or implantation bleeding given light spotting.  UA was unremarkable.  Pain very likely could also be secondary to constipation/history of ventral hernia/SBO.  But vital signs stable with nonacute abdomen.  Bacterial vaginosis: Confirmed on wet prep, may contribute to vaginal bleeding and dysuria symptoms.  Chest wall pain: Pulling pain of border of right breast, may be related to history of CHF. EKG in triage found no acute changes with normal sinus rhythm. BNP pending, bilateral 3+ pitting edema to mid tibia and not taking lasix  for about 3 weeks. Possibly  intercostal pain or secondary to breast cyst or mass, told to evaluate outpatient.   MAU Management: Orders Placed This Encounter  Procedures   Wet prep, genital   US  OB LESS THAN 14 WEEKS WITH OB TRANSVAGINAL   Urinalysis, Routine w reflex microscopic -Urine, Clean Catch   hCG, quantitative, pregnancy   CBC   ED EKG    Meds ordered this encounter  Medications   scopolamine  (TRANSDERM-SCOP) 1 MG/3DAYS 1.5 mg   scopolamine  (TRANSDERM-SCOP) 1 MG/3DAYS    Sig: Place 1 patch (1.5 mg total) onto the skin every 3 (three) days for 30 doses.    Dispense:  10 patch    Refill:  2   Doxylamine -Pyridoxine  (DICLEGIS ) 10-10 MG TBEC    Sig: Take 1 tablet by mouth 4 (four) times daily as needed (Take two tablets at night, if nausea persists you can take one in the morning and one in the afternoon.).    Dispense:  60 tablet    Refill:  2   senna (SENOKOT) 8.6 MG TABS tablet    Sig: Take 1 tablet (8.6 mg total) by mouth at bedtime as needed for mild constipation.    Dispense:  120 tablet    Refill:  0   docusate sodium  (COLACE) 100 MG capsule    Sig: Take  1 capsule (100 mg total) by mouth 2 (two) times daily.    Dispense:  60 capsule    Refill:  2   metroNIDAZOLE  (FLAGYL ) 500 MG tablet    Sig: Take 1 tablet (500 mg total) by mouth 2 (two) times daily for 7 days.    Dispense:  14 tablet     Refill:  0   enoxaparin  (LOVENOX ) 60 MG/0.6ML injection    Sig: Inject 0.6 mLs (60 mg total) into the skin daily.    Dispense:  18 mL    Refill:  9   promethazine  (PHENERGAN ) 6.25 MG/5ML solution    Sig: Take 5 mLs (6.25 mg total) by mouth 4 (four) times daily as needed for nausea or vomiting.    Dispense:  120 mL    Refill:  1    Treatments in MAU included EKG during triage, Evaluated in triage with physical exam.  ASSESSMENT 1. Vaginal bleeding affecting early pregnancy   2. [redacted] weeks gestation of pregnancy   3. Bacterial vaginosis   4. Generalized abdominal pain     PLAN Discharge home with strict return precautions. -Diclegis  4 times daily as needed, phenergan  q6 hours PRN, and Zofran  4 mg ODT as needed, scopolamine  patch every 3 days. -Flagyl  500 mg twice daily for 7 days for bacterial vaginosis -Start  bowel regimen senna 8.6 mg nightly and docusate 100 mg twice daily and high fiber diet -Establish MFM prenatal care  Allergies as of 01/22/2024       Reactions   Prednisone Swelling        Medication List     STOP taking these medications    fluticasone -salmeterol 115-21 MCG/ACT inhaler Commonly known as: Advair HFA   prenatal vitamin w/FE, FA 27-1 MG Tabs tablet   pyridOXINE  25 MG tablet Commonly known as: VITAMIN B6       TAKE these medications    acetaminophen  500 MG tablet Commonly known as: TYLENOL  Take 1,000 mg by mouth every 6 (six) hours as needed for mild pain.   albuterol  108 (90 Base) MCG/ACT inhaler Commonly known as: VENTOLIN  HFA Inhale 1-2 puffs into the lungs every 6 (six) hours as needed for wheezing or shortness of breath. What changed: Another medication with the same name was removed. Continue taking this medication, and follow the directions you see here.   docusate sodium  100 MG capsule Commonly known as: Colace Take 1 capsule (100 mg total) by mouth 2 (two) times daily.   Doxylamine -Pyridoxine  10-10 MG Tbec Commonly known as:  Diclegis  Take 1 tablet by mouth 4 (four) times daily as needed (Take two tablets at night, if nausea persists you can take one in the morning and one in the afternoon.).   enoxaparin  60 MG/0.6ML injection Commonly known as: LOVENOX  Inject 0.6 mLs (60 mg total) into the skin daily. What changed:  how much to take when to take this   Iron  (Ferrous Sulfate ) 325 (65 Fe) MG Tabs Take 1 tablet by mouth every other day.   metroNIDAZOLE  500 MG tablet Commonly known as: FLAGYL  Take 1 tablet (500 mg total) by mouth 2 (two) times daily for 7 days.   ondansetron  4 MG disintegrating tablet Commonly known as: ZOFRAN -ODT Take 1 tablet (4 mg total) by mouth every 8 (eight) hours as needed for nausea or vomiting.   polyethylene glycol powder 17 GM/SCOOP powder Commonly known as: MiraLax  Take 17 g by mouth daily as needed for mild constipation, moderate constipation or severe constipation.   prenatal  multivitamin Tabs tablet Take 1 tablet by mouth daily at 12 noon.   promethazine  6.25 MG/5ML solution Commonly known as: PHENERGAN  Take 5 mLs (6.25 mg total) by mouth 4 (four) times daily as needed for nausea or vomiting.   promethazine -dextromethorphan 6.25-15 MG/5ML syrup Commonly known as: PROMETHAZINE -DM Take 5 mLs by mouth 3 (three) times daily as needed for cough.   scopolamine  1 MG/3DAYS Commonly known as: TRANSDERM-SCOP Place 1 patch (1.5 mg total) onto the skin every 3 (three) days for 30 doses. Start taking on: January 25, 2024   senna 8.6 MG Tabs tablet Commonly known as: SENOKOT Take 1 tablet (8.6 mg total) by mouth at bedtime as needed for mild constipation.         Fairy Amy, MD [facility] Family Medicine Resident, PGY-1 01/22/2024  8:31 PM

## 2024-01-22 NOTE — MAU Note (Signed)
 Brooke Sandoval is a 38 y.o. at Unknown here in MAU reporting: she's been cramping for the past three days and cramping worsened today.  Denies VB, but has pink spotting with wiping.   Also reports she's having intermittent pulling cramping pain in the middle of her chest.  LMP: Unsure Onset of complaint: 3 days Pain score: 7 Vitals:   01/22/24 1738  BP: 137/76  Pulse: 84  Resp: 19  Temp: 98.2 F (36.8 C)  SpO2: 98%     FHT: NA , pt reports she's about 6 weeks  Lab orders placed from triage: UA

## 2024-01-22 NOTE — Discharge Instructions (Signed)
 Take the senna every night and the docusate in the morning and night for constipation. Remember to eat lots of fiber and drink lots of water.  Morning sickness: Take the Diclegis , two pills at night. If nausea persists you can take one in the morning and one in the afternoon. Use the phenergan  as needed for nausea, if you are still nauseous then use the zofran .  I have sent your information to the family medicine center and the maternal fetal medicine centers.

## 2024-01-23 ENCOUNTER — Other Ambulatory Visit (HOSPITAL_COMMUNITY): Payer: Self-pay

## 2024-01-23 ENCOUNTER — Telehealth: Payer: Self-pay

## 2024-01-23 ENCOUNTER — Ambulatory Visit: Payer: Self-pay | Admitting: Obstetrics and Gynecology

## 2024-01-23 LAB — GC/CHLAMYDIA PROBE AMP (~~LOC~~) NOT AT ARMC
Chlamydia: NEGATIVE
Comment: NEGATIVE
Comment: NORMAL
Neisseria Gonorrhea: NEGATIVE

## 2024-01-23 NOTE — Telephone Encounter (Signed)
 Pharmacy Patient Advocate Encounter   Received notification from CoverMyMeds that prior authorization for Doxylamine -Pyridoxine  10-10MG  dr tablets  is required/requested.   Insurance verification completed.   The patient is insured through [facility] [place] .   PA required; PA submitted to above mentioned insurance via CoverMyMeds Key/confirmation #/EOC [id number]. Status is pending

## 2024-01-23 NOTE — Telephone Encounter (Signed)
 Pharmacy Patient Advocate Encounter  Received notification from [employer] that Prior Authorization for Doxylamine -Pyridoxine  10-10MG  dr tablets  has been APPROVED from 01/23/24 to 07/21/24   PA #/Case ID/Reference #: [id number]

## 2024-01-25 ENCOUNTER — Encounter: Payer: Self-pay | Admitting: Family Medicine

## 2024-01-25 DIAGNOSIS — R112 Nausea with vomiting, unspecified: Secondary | ICD-10-CM

## 2024-01-25 MED ORDER — ONDANSETRON 4 MG PO TBDP: 4.0000 mg | ORAL_TABLET | Freq: Three times a day (TID) | ORAL | 0 refills | Status: DC | PRN

## 2024-01-25 NOTE — Progress Notes (Signed)
 Patient reached out to me this afternoon via my chart requesting for a refill of Zofran . Had seen her 3 days earlier in MAU with Dr. Suzen Masters. We had discussed to use it sparingly and she had some pills available, but has since ran out. I've sent a refill for the Odansetrin 0.4 mg ODT every 6 hours as needed in the mean time while she waits to establish care with OB.

## 2024-02-06 ENCOUNTER — Telehealth

## 2024-02-06 DIAGNOSIS — O09291 Supervision of pregnancy with other poor reproductive or obstetric history, first trimester: Secondary | ICD-10-CM

## 2024-02-06 DIAGNOSIS — O0991 Supervision of high risk pregnancy, unspecified, first trimester: Secondary | ICD-10-CM | POA: Diagnosis not present

## 2024-02-06 DIAGNOSIS — Z3A09 9 weeks gestation of pregnancy: Secondary | ICD-10-CM

## 2024-02-06 DIAGNOSIS — D6859 Other primary thrombophilia: Secondary | ICD-10-CM

## 2024-02-06 DIAGNOSIS — Z8679 Personal history of other diseases of the circulatory system: Secondary | ICD-10-CM

## 2024-02-06 DIAGNOSIS — O0990 Supervision of high risk pregnancy, unspecified, unspecified trimester: Secondary | ICD-10-CM | POA: Insufficient documentation

## 2024-02-06 DIAGNOSIS — Z98891 History of uterine scar from previous surgery: Secondary | ICD-10-CM

## 2024-02-06 DIAGNOSIS — Z6841 Body Mass Index (BMI) 40.0 and over, adult: Secondary | ICD-10-CM

## 2024-02-06 DIAGNOSIS — Z8632 Personal history of gestational diabetes: Secondary | ICD-10-CM

## 2024-02-06 MED ORDER — GOJJI WEIGHT SCALE MISC: 1.0000 | Freq: Once | 0 refills | Status: AC

## 2024-02-06 MED ORDER — BLOOD PRESSURE KIT DEVI: 1.0000 | Freq: Once | 0 refills | Status: AC

## 2024-02-06 NOTE — Progress Notes (Signed)
 New OB Intake  I connected with Brooke Sandoval  on 02/06/24 at  1:15 PM EDT by telephone Video Visit and verified that I am speaking with the correct person using two identifiers. Nurse is located at [facility] and pt is located at home.  I discussed the limitations, risks, security and privacy concerns of performing an evaluation and management service by telephone and the availability of in person appointments. I also discussed with the patient that there may be a patient responsible charge related to this service. The patient expressed understanding and agreed to proceed.  I explained I am completing New OB Intake today. We discussed EDD of 09/06/24 based on US  at 7 weeks. Pt is [id number]. I reviewed her allergies, medications and Medical/Surgical/OB history.    Patient Active Problem List   Diagnosis Date Noted   Supervision of high risk pregnancy, antepartum 02/06/2024   History of congestive heart failure 01/22/2024   History of cesarean section 12/27/2021   Ventral hernia without obstruction or gangrene 12/27/2021   History of substance abuse (HCC) 03/03/2015   History of gestational diabetes 02/16/2015   Asthma affecting pregnancy, antepartum 12/09/2014   Chronic hypertension during pregnancy, antepartum 12/09/2014   Anxiety 10/08/2014   Previous cesarean delivery affecting pregnancy, antepartum 10/08/2014   Hx of pulmonary embolus during pregnancy 10/08/2014   Protein S deficiency affecting pregnancy, antepartum (HCC) 10/08/2014   BMI 50.0-59.9, adult (HCC) 08/28/2014   History of pre-eclampsia in prior pregnancy, currently pregnant in first trimester 08/28/2014     Concerns addressed today Pt expressed concerns about being high risk pregnancy.  She has never delivered in [facility] system and wanted to ensure [facility] and MFM can provide high risk care.  She reports anxiety about this pregnancy due to two fetal demises in past due to blood clots and placental issues.  Delivery  Plans Plans to deliver at [facility]. Discussed the nature of our practice with multiple providers including residents and students as well as female and female providers. Due to the size of the practice, the delivering provider may not be the same as those providing prenatal care.   Patient not interested in water birth.  MyChart/Babyscripts MyChart access verified. I explained pt will have some visits in office and some virtually. Babyscripts instructions given and order placed.  Blood Pressure Cuff/Weight Scale Blood pressure cuff ordered for patient to pick-up from [employer]. Explained after first prenatal appt pt will check weekly and document in Babyscripts. Patient does not have weight scale; order sent to [facility], patient may track weight weekly in Babyscripts.  Anatomy US  Explained first scheduled US  will be around 19 weeks. Anatomy US  scheduled for 04/16/24 at 10:00.  Is patient a CenteringPregnancy candidate?  Not a Candidate  Is patient a Mom+Baby Combined Care candidate?  Not a candidate    Is patient a candidate for Babyscripts Optimization? No, due to high risk.   First visit review I reviewed new OB appt with patient. Explained pt will be seen by Dr. Zina at first visit on 02/21/24 at 2:15. Discussed [place] genetic screening with patient.  Panorama, Horizon, routine prenatal labs, and urine culture to be collected at [facility] Visit.  Last Pap No results found for: DIAGPAP  Lorenzo Pereyra L Annalis Kaczmarczyk, RN 02/06/2024  3:32 PM

## 2024-02-21 ENCOUNTER — Ambulatory Visit: Payer: Self-pay | Admitting: Obstetrics and Gynecology

## 2024-02-21 ENCOUNTER — Encounter: Payer: Self-pay | Admitting: Obstetrics and Gynecology

## 2024-02-21 ENCOUNTER — Other Ambulatory Visit: Payer: Self-pay

## 2024-02-21 VITALS — BP 123/88 | HR 88 | Wt 386.5 lb

## 2024-02-21 DIAGNOSIS — K439 Ventral hernia without obstruction or gangrene: Secondary | ICD-10-CM

## 2024-02-21 DIAGNOSIS — Z9189 Other specified personal risk factors, not elsewhere classified: Secondary | ICD-10-CM | POA: Insufficient documentation

## 2024-02-21 DIAGNOSIS — Z3A11 11 weeks gestation of pregnancy: Secondary | ICD-10-CM | POA: Diagnosis not present

## 2024-02-21 DIAGNOSIS — O0991 Supervision of high risk pregnancy, unspecified, first trimester: Secondary | ICD-10-CM

## 2024-02-21 DIAGNOSIS — O99111 Other diseases of the blood and blood-forming organs and certain disorders involving the immune mechanism complicating pregnancy, first trimester: Secondary | ICD-10-CM

## 2024-02-21 DIAGNOSIS — J45909 Unspecified asthma, uncomplicated: Secondary | ICD-10-CM

## 2024-02-21 DIAGNOSIS — Z8632 Personal history of gestational diabetes: Secondary | ICD-10-CM

## 2024-02-21 DIAGNOSIS — O99511 Diseases of the respiratory system complicating pregnancy, first trimester: Secondary | ICD-10-CM | POA: Diagnosis not present

## 2024-02-21 DIAGNOSIS — Z98891 History of uterine scar from previous surgery: Secondary | ICD-10-CM

## 2024-02-21 DIAGNOSIS — Z6841 Body Mass Index (BMI) 40.0 and over, adult: Secondary | ICD-10-CM

## 2024-02-21 DIAGNOSIS — D6859 Other primary thrombophilia: Secondary | ICD-10-CM

## 2024-02-21 DIAGNOSIS — Z7189 Other specified counseling: Secondary | ICD-10-CM | POA: Insufficient documentation

## 2024-02-21 DIAGNOSIS — Z86711 Personal history of pulmonary embolism: Secondary | ICD-10-CM

## 2024-02-21 DIAGNOSIS — O10911 Unspecified pre-existing hypertension complicating pregnancy, first trimester: Secondary | ICD-10-CM

## 2024-02-21 DIAGNOSIS — Z8679 Personal history of other diseases of the circulatory system: Secondary | ICD-10-CM

## 2024-02-21 DIAGNOSIS — O4440 Low lying placenta NOS or without hemorrhage, unspecified trimester: Secondary | ICD-10-CM | POA: Insufficient documentation

## 2024-02-21 DIAGNOSIS — O09291 Supervision of pregnancy with other poor reproductive or obstetric history, first trimester: Secondary | ICD-10-CM

## 2024-02-21 DIAGNOSIS — O0990 Supervision of high risk pregnancy, unspecified, unspecified trimester: Secondary | ICD-10-CM

## 2024-02-21 DIAGNOSIS — O10919 Unspecified pre-existing hypertension complicating pregnancy, unspecified trimester: Secondary | ICD-10-CM

## 2024-02-21 DIAGNOSIS — O34219 Maternal care for unspecified type scar from previous cesarean delivery: Secondary | ICD-10-CM

## 2024-02-21 MED ORDER — ENOXAPARIN SODIUM 60 MG/0.6ML IJ SOSY: 90.0000 mg | PREFILLED_SYRINGE | Freq: Two times a day (BID) | INTRAMUSCULAR | 9 refills | Status: DC

## 2024-02-21 MED ORDER — ASPIRIN 81 MG PO CHEW: 162.0000 mg | CHEWABLE_TABLET | Freq: Every day | ORAL | 11 refills | Status: AC

## 2024-02-21 NOTE — Progress Notes (Signed)
 INITIAL PRENATAL VISIT NOTE  Subjective:  Brooke Sandoval is a 38 y.o. H0E9284 at [redacted]w[redacted]d by early ultrasound being seen today for her initial prenatal visit.  She has an obstetric history significant for 5 c sections, many done in the late second trimester, two fetal demises 2/2 placental issues (possible vascular clot) and several incidences of preeclampsia. She has a medical history significant for morbid obesity, DVT, PE in pregnancy, CHF and protein S deficiency as well as a bowel injury at last c section along with ? C dif wound infection.  Patient reports no complaints.   . Vag. Bleeding: None.   . Denies leaking of fluid.    Past Medical History:  Diagnosis Date   ADHD (attention deficit hyperactivity disorder)    Anxiety    Attention deficit hyperactivity disorder (ADHD), 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  Prescribed by [facility] in [place].  She DC'd with + UPT but would like to restart.  Counseled not recommended in pregnancy.  Counseled to establish with [facility] ASAP as this clinic will not prescribe those types of meds.  Verbal and written information provided today.  Will discuss with MFM at [facility] .     If symptoms not well controlled with counseling/behavioral therapy, re   CHF (congestive heart failure) (HCC)    after admission for vetnal hernia   Coagulation factor disorder (HCC)    DVT (deep venous thrombosis) (HCC)    History of chlamydia infection 09/12/2014   3/19 Called in Azithromycin Rx for positive chlamydia.   4/28:  TOC NEG         02/25/2015  TOC neg 8/10     History of gestational diabetes    History of pre-eclampsia    Miscarriage    Poor dentition 10/10/2014   Records note poor dentition in prior records. Please ensure she is getting regular dental care.     Poor intravenous access 12/09/2014   In last pregnancy, required port access.      Past Surgical History:  Procedure Laterality Date   APPENDECTOMY     CESAREAN SECTION      TONSILLECTOMY     VENTRAL HERNIA REPAIR      OB History  Gravida Para Term Preterm AB Living  9 7  7 1 5   SAB IAB Ectopic Multiple Live Births  1    5    # Outcome Date GA Lbr Len/2nd Weight Sex Type Anes PTL Lv  9 Current           8 Preterm 04/06/15 [redacted]w[redacted]d   M CS-LTranv   LIV  7 SAB 2014 [redacted]w[redacted]d         6 Preterm 04/14/11 [redacted]w[redacted]d    CS-LTranv   LIV  5 Preterm 02/2010 [redacted]w[redacted]d   M Vag-Spont   FD  4 Preterm 09/23/07 [redacted]w[redacted]d   F CS-LTranv   LIV  3 Preterm 07/26/06 [redacted]w[redacted]d   M CS-LTranv   LIV  2 Preterm 2007 [redacted]w[redacted]d    Vag-Spont   FD     Complications: Clotting disorder (HCC)  1 Preterm 09/15/03 [redacted]w[redacted]d   M CS-LTranv   LIV    Obstetric Comments  Hx GDM, pre-E, clotting disorder, two fetal demise due to blood clot in placenta per pt.  Past deliveries at [facility] and in [place] .    Social History   Socioeconomic History   Marital status: Single    Spouse name: Not on file   Number of children: Not  on file   Years of education: Not on file   Highest education level: Not on file  Occupational History   Not on file  Tobacco Use   Smoking status: Never    Passive exposure: Never   Smokeless tobacco: Never  Vaping Use   Vaping status: Never Used  Substance and Sexual Activity   Alcohol use: Not Currently    Comment: socially before pregnancy   Drug use: No   Sexual activity: Yes    Birth control/protection: None    Comment: Female  Other Topics Concern   Not on file  Social History Narrative   Not on file   Social Drivers of Health   Financial Resource Strain: Not on file  Food Insecurity: Not on file  Transportation Needs: Not on file  Physical Activity: Not on file  Stress: Not on file  Social Connections: Not on file    Family History  Problem Relation Age of Onset   Depression Mother    COPD Mother    Diabetes Mother    Schizophrenia Father    Drug abuse Sister      Current Outpatient Medications:    albuterol  (VENTOLIN  HFA) 108 (90 Base) MCG/ACT inhaler, Inhale  1-2 puffs into the lungs every 6 (six) hours as needed for wheezing or shortness of breath., Disp: 1 each, Rfl: 1   furosemide  (LASIX ) 40 MG tablet, Take 1 tablet (40 mg total) by mouth daily., Disp: 30 tablet, Rfl: 11   Iron , Ferrous Sulfate , 325 (65 Fe) MG TABS, Take 1 tablet by mouth every other day., Disp: 30 tablet, Rfl: 3   Prenatal Vit-Fe Fumarate-FA (MULTIVITAMIN-PRENATAL) 27-0.8 MG TABS tablet, Take 1 tablet by mouth daily at 12 noon., Disp: , Rfl:    promethazine  (PHENERGAN ) 6.25 MG/5ML solution, Take 5 mLs (6.25 mg total) by mouth 4 (four) times daily as needed for nausea or vomiting., Disp: 120 mL, Rfl: 1   promethazine -dextromethorphan (PROMETHAZINE -DM) 6.25-15 MG/5ML syrup, Take 5 mLs by mouth 3 (three) times daily as needed for cough., Disp: 118 mL, Rfl: 0   senna (SENOKOT) 8.6 MG TABS tablet, Take 1 tablet (8.6 mg total) by mouth at bedtime as needed for mild constipation., Disp: 120 tablet, Rfl: 0   aspirin  (ASPIRIN  CHILDRENS) 81 MG chewable tablet, Chew 2 tablets (162 mg total) by mouth daily., Disp: 60 tablet, Rfl: 11   docusate sodium  (COLACE) 100 MG capsule, Take 1 capsule (100 mg total) by mouth 2 (two) times daily., Disp: 60 capsule, Rfl: 2   enoxaparin  (LOVENOX ) 60 MG/0.6ML injection, Inject 0.9 mLs (90 mg total) into the skin every 12 (twelve) hours., Disp: 54 mL, Rfl: 9   ondansetron  (ZOFRAN -ODT) 4 MG disintegrating tablet, Take 1 tablet (4 mg total) by mouth every 8 (eight) hours as needed for nausea or vomiting., Disp: 20 tablet, Rfl: 0  Allergies  Allergen Reactions   Prednisone Swelling    Review of Systems: Negative except for what is mentioned in HPI.  Objective:   Vitals:   02/21/24 1445  BP: 123/88  Pulse: 88  Weight: (!) 386 lb 8 oz (175.3 kg)    Fetal Status:         TVUS performed to evaluate viability when abdominal scan failed, viable fetus noted with movement and anterior placenta  Physical Exam: BP 123/88   Pulse 88   Wt (!) 386 lb 8 oz (175.3  kg)   LMP  (LMP Unknown)   BMI 57.08 kg/m  CONSTITUTIONAL: Well-developed, obese, well-nourished  female in no acute distress. Poor dentition noted NEUROLOGIC: Alert and oriented to person, place, and time. Normal reflexes, muscle tone coordination. No cranial nerve deficit noted. PSYCHIATRIC: Normal mood and affect. Normal behavior. Normal judgment and thought content. SKIN: Skin is warm and dry. No rash noted. Not diaphoretic. No erythema. No pallor. HENT:  Normocephalic, atraumatic, External right and left ear normal. Oropharynx is clear and moist EYES: Conjunctivae and EOM are normal. Pupils are equal, round, and reactive to light. No scleral icterus.  NECK: Normal range of motion, supple, no masses, long scar on right side of neck CARDIOVASCULAR: Normal heart rate noted, regular rhythm RESPIRATORY: Effort and breath sounds normal, no problems with respiration noted BREASTS: deferred ABDOMEN: Soft, nontender, nondistended, gravid, possible hernia in RLQ, large pannus noted. HL:[id number] MUSCULOSKELETAL: Normal range of motion. EXT:  No edema and no tenderness. 2+ distal pulses.   Assessment and Plan:  Pregnancy: [id number] at [redacted]w[redacted]d by early ultrasound  1. [redacted] weeks gestation of pregnancy (Primary)   2. Chronic hypertension during pregnancy, antepartum P/c ratio unable to be obtained today, baseline labs ordered Will refer to OB cards - Comprehensive metabolic panel with GFR - Protein / creatinine ratio, urine - AMB Referral to Cardio Obstetrics  3. Asthma affecting pregnancy, antepartum   4. Protein S deficiency affecting pregnancy, antepartum (HCC) Lovenox  changed after discussion with MFM to 90 mg BID  - US  MFM OB COMPLETE LESS THAN 14 WEEKS; Future - AMB Referral to Cardio Obstetrics  5. Supervision of high risk pregnancy, antepartum Continue routine prenatal care  - Culture, OB Urine - CBC/D/Plt+RPR+Rh+ABO+RubIgG... - Hemoglobin A1c - PANORAMA PRENATAL TEST -  HORIZON Basic Panel - US  MFM OB COMPLETE LESS THAN 14 WEEKS; Future  6. Ventral hernia without obstruction or gangrene Pt notes possible ventral hernia from previous surgeries  7. Previous cesarean delivery affecting pregnancy, antepartum Pt has had 5 previous c sections, with bowel injury and postoperative wound infection the last time, on TVUS pt had anterior placenta, will need to follow to rule out accreta spectrum  8. Hx of pulmonary embolus during pregnancy Lovenox  prophylaxis ordered  - enoxaparin  (LOVENOX ) 60 MG/0.6ML injection; Inject 0.9 mLs (90 mg total) into the skin every 12 (twelve) hours.  Dispense: 54 mL; Refill: 9 - AMB Referral to Cardio Obstetrics  9. History of pre-eclampsia in prior pregnancy, currently pregnant in first trimester Baby ASA increased to 162 mg daily  - Comprehensive metabolic panel with GFR - Protein / creatinine ratio, urine - aspirin  (ASPIRIN  CHILDRENS) 81 MG chewable tablet; Chew 2 tablets (162 mg total) by mouth daily.  Dispense: 60 tablet; Refill: 11 - AMB Referral to Cardio Obstetrics  10. History of gestational diabetes At some point will need early 2 hour GTT  - US  MFM OB COMPLETE LESS THAN 14 WEEKS; Future  11. History of congestive heart failure Refer to OB cards  - [facility]  MFM OB COMPLETE LESS THAN 14 WEEKS; Future - AMB Referral to Cardio Obstetrics  12. BMI 50.0-59.9, adult (HCC)  - AMB Referral to Cardio Obstetrics  13. History of cesarean section See above  14. RED chart patient Pt placed on red chart roster to be discussed monthly  15. At risk for injury related to surgical procedure, history of bowel injury May need co-surgery with general surgery at time of delivery  16. Placenta positioned low in anterior wall of uterus Follow  for risk of accreta   Preterm labor symptoms and general obstetric precautions including but not limited  to vaginal bleeding, contractions, leaking of fluid and fetal movement were reviewed  in detail with the patient.  Please refer to After Visit Summary for other counseling recommendations.   Return in about 3 weeks (around 03/13/2024).  Brooke Sandoval 02/21/2024 5:23 PM

## 2024-02-22 ENCOUNTER — Ambulatory Visit: Payer: Self-pay | Admitting: Obstetrics and Gynecology

## 2024-02-22 DIAGNOSIS — O0990 Supervision of high risk pregnancy, unspecified, unspecified trimester: Secondary | ICD-10-CM

## 2024-02-22 LAB — CBC/D/PLT+RPR+RH+ABO+RUBIGG...
Antibody Screen: NEGATIVE
Basophils Absolute: 0 x10E3/uL (ref 0.0–0.2)
Basos: 0 %
EOS (ABSOLUTE): 0.1 x10E3/uL (ref 0.0–0.4)
Eos: 1 %
HCV Ab: NONREACTIVE
HIV Screen 4th Generation wRfx: NONREACTIVE
Hematocrit: 38.1 % (ref 34.0–46.6)
Hemoglobin: 12.1 g/dL (ref 11.1–15.9)
Hepatitis B Surface Ag: NEGATIVE
Immature Grans (Abs): 0 x10E3/uL (ref 0.0–0.1)
Immature Granulocytes: 0 %
Lymphocytes Absolute: 2.5 x10E3/uL (ref 0.7–3.1)
Lymphs: 25 %
MCH: 26.4 pg — ABNORMAL LOW (ref 26.6–33.0)
MCHC: 31.8 g/dL (ref 31.5–35.7)
MCV: 83 fL (ref 79–97)
Monocytes Absolute: 0.3 x10E3/uL (ref 0.1–0.9)
Monocytes: 3 %
Neutrophils Absolute: 6.9 x10E3/uL (ref 1.4–7.0)
Neutrophils: 71 %
Platelets: 211 x10E3/uL (ref 150–450)
RBC: 4.58 x10E6/uL (ref 3.77–5.28)
RDW: 18.3 % — ABNORMAL HIGH (ref 11.7–15.4)
RPR Ser Ql: NONREACTIVE
Rh Factor: POSITIVE
Rubella Antibodies, IGG: 12.3 {index} (ref >0.99)
WBC: 9.9 x10E3/uL (ref 3.4–10.8)

## 2024-02-22 LAB — COMPREHENSIVE METABOLIC PANEL WITH GFR
ALT: 19 IU/L (ref 0–32)
AST: 16 IU/L (ref 0–40)
Albumin: 3.7 g/dL — ABNORMAL LOW (ref 3.9–4.9)
Alkaline Phosphatase: 71 IU/L (ref 44–121)
BUN/Creatinine Ratio: 11 (ref 9–23)
BUN: 7 mg/dL (ref 6–24)
Bilirubin Total: 0.3 mg/dL (ref 0.0–1.2)
CO2: 22 mmol/L (ref 20–29)
Calcium: 9.2 mg/dL (ref 8.7–10.2)
Chloride: 100 mmol/L (ref 96–106)
Creatinine, Ser: 0.62 mg/dL (ref 0.57–1.00)
Globulin, Total: 2.9 g/dL (ref 1.5–4.5)
Glucose: 87 mg/dL (ref 70–99)
Potassium: 3.9 mmol/L (ref 3.5–5.2)
Sodium: 137 mmol/L (ref 134–144)
Total Protein: 6.6 g/dL (ref 6.0–8.5)
eGFR: 115 mL/min/1.73 (ref >59)

## 2024-02-22 LAB — HCV INTERPRETATION

## 2024-02-22 LAB — HEMOGLOBIN A1C
Est. average glucose Bld gHb Est-mCnc: 114 mg/dL
Hgb A1c MFr Bld: 5.6 % (ref 4.8–5.6)

## 2024-02-23 LAB — URINE CULTURE, OB REFLEX

## 2024-02-23 LAB — CULTURE, OB URINE

## 2024-02-28 ENCOUNTER — Encounter: Payer: Self-pay | Admitting: Obstetrics and Gynecology

## 2024-02-28 ENCOUNTER — Telehealth: Payer: Self-pay | Admitting: *Deleted

## 2024-02-28 DIAGNOSIS — Z86711 Personal history of pulmonary embolism: Secondary | ICD-10-CM

## 2024-02-28 LAB — PANORAMA PRENATAL TEST FULL PANEL:PANORAMA TEST PLUS 5 ADDITIONAL MICRODELETIONS: FETAL FRACTION: 2.7

## 2024-02-28 MED ORDER — ENOXAPARIN SODIUM 60 MG/0.6ML IJ SOSY: 100.0000 mg | PREFILLED_SYRINGE | Freq: Two times a day (BID) | INTRAMUSCULAR | 9 refills | Status: DC

## 2024-02-28 NOTE — Telephone Encounter (Addendum)
 I called pt per her Mychart request. She stated having a problem with Lovenox  injections. Because of how the medication is prescribed, the pharmacy has advised her that Medicaid will not pay for all the syringes that she needs. Currently she has to give total of 2 injections each morning and 2 injections each evening daily in order to get the dose prescribed (90 mg). I discussed with Dr. Zina and he changed dose to 100 mg BID - new Rx e-prescribed. Pt was informed to continue her current regimen until her new prescription is ready for pick up from pharmacy. Pt was also informed of need to redraw Panorama test as not enough fetal information was obtained. She voiced understanding.

## 2024-03-01 LAB — HORIZON CUSTOM: REPORT SUMMARY: NEGATIVE

## 2024-03-04 ENCOUNTER — Telehealth: Payer: Self-pay | Admitting: *Deleted

## 2024-03-04 DIAGNOSIS — Z8759 Personal history of other complications of pregnancy, childbirth and the puerperium: Secondary | ICD-10-CM

## 2024-03-04 NOTE — Telephone Encounter (Signed)
 Olam, Pregnancy navigator notified nurse patient wants someone to call her re: her lovenox . She states she is wonderinf if it can be sent to a different pharmacy that delivers. [place] Skip PEAK

## 2024-03-05 ENCOUNTER — Other Ambulatory Visit (HOSPITAL_COMMUNITY): Payer: Self-pay

## 2024-03-05 MED ORDER — ENOXAPARIN SODIUM 60 MG/0.6ML IJ SOSY
100.0000 mg | PREFILLED_SYRINGE | Freq: Two times a day (BID) | INTRAMUSCULAR | 9 refills | Status: DC
  Filled 2024-03-05: qty 60, 30d supply, fill #0

## 2024-03-05 NOTE — Addendum Note (Signed)
 Addended by: ELBY WADDELL CROME on: 03/05/2024 09:29 AM   Modules accepted: Orders

## 2024-03-05 NOTE — Telephone Encounter (Signed)
 RN returned pt phone call about pharmacies that offer delivery.  Lovenox  reordered to [facility] pharmacy per pt request.    [place], RN

## 2024-03-06 ENCOUNTER — Ambulatory Visit: Attending: Obstetrics and Gynecology

## 2024-03-06 ENCOUNTER — Ambulatory Visit (HOSPITAL_BASED_OUTPATIENT_CLINIC_OR_DEPARTMENT_OTHER): Admitting: Obstetrics

## 2024-03-06 ENCOUNTER — Other Ambulatory Visit: Payer: Self-pay | Admitting: *Deleted

## 2024-03-06 ENCOUNTER — Other Ambulatory Visit: Payer: Self-pay | Admitting: Obstetrics and Gynecology

## 2024-03-06 VITALS — BP 128/90 | HR 81

## 2024-03-06 DIAGNOSIS — Z8632 Personal history of gestational diabetes: Secondary | ICD-10-CM | POA: Diagnosis present

## 2024-03-06 DIAGNOSIS — Z8679 Personal history of other diseases of the circulatory system: Secondary | ICD-10-CM | POA: Diagnosis present

## 2024-03-06 DIAGNOSIS — O09521 Supervision of elderly multigravida, first trimester: Secondary | ICD-10-CM

## 2024-03-06 DIAGNOSIS — O0990 Supervision of high risk pregnancy, unspecified, unspecified trimester: Secondary | ICD-10-CM | POA: Diagnosis present

## 2024-03-06 DIAGNOSIS — O0941 Supervision of pregnancy with grand multiparity, first trimester: Secondary | ICD-10-CM

## 2024-03-06 DIAGNOSIS — O99211 Obesity complicating pregnancy, first trimester: Secondary | ICD-10-CM

## 2024-03-06 DIAGNOSIS — O10919 Unspecified pre-existing hypertension complicating pregnancy, unspecified trimester: Secondary | ICD-10-CM

## 2024-03-06 DIAGNOSIS — Z6841 Body Mass Index (BMI) 40.0 and over, adult: Secondary | ICD-10-CM

## 2024-03-06 DIAGNOSIS — O88211 Thromboembolism in pregnancy, first trimester: Secondary | ICD-10-CM

## 2024-03-06 DIAGNOSIS — O99119 Other diseases of the blood and blood-forming organs and certain disorders involving the immune mechanism complicating pregnancy, unspecified trimester: Secondary | ICD-10-CM | POA: Insufficient documentation

## 2024-03-06 DIAGNOSIS — Z86711 Personal history of pulmonary embolism: Secondary | ICD-10-CM

## 2024-03-06 DIAGNOSIS — O10011 Pre-existing essential hypertension complicating pregnancy, first trimester: Secondary | ICD-10-CM

## 2024-03-06 DIAGNOSIS — D6859 Other primary thrombophilia: Secondary | ICD-10-CM

## 2024-03-06 DIAGNOSIS — Z3A13 13 weeks gestation of pregnancy: Secondary | ICD-10-CM

## 2024-03-06 DIAGNOSIS — O09291 Supervision of pregnancy with other poor reproductive or obstetric history, first trimester: Secondary | ICD-10-CM | POA: Insufficient documentation

## 2024-03-06 DIAGNOSIS — E669 Obesity, unspecified: Secondary | ICD-10-CM

## 2024-03-06 DIAGNOSIS — Z8759 Personal history of other complications of pregnancy, childbirth and the puerperium: Secondary | ICD-10-CM | POA: Diagnosis present

## 2024-03-06 NOTE — Progress Notes (Signed)
 MFM Consult Note  Brooke Sandoval is currently at 13 weeks and 4 days.  She was seen due to advanced maternal age (38 years old), maternal obesity with a BMI of 59, history of multiple indicated preterm birth due to early onset severe preeclampsia, history of pulmonary embolus with protein S deficiency, and history of congestive heart failure.  She has 5 prior cesarean deliveries.  She reports that she had a bowel injury during her last C-section.  She reports that she was diagnosed with heart failure after one of her prior pregnancies.  She is currently treated with Lasix  40 mg daily.  She has an appointment with cardio-obstetrics tomorrow.  She had a pulmonary embolus in 2008 and was subsequently found to have a protein S deficiency.  She has been prescribed therapeutic Lovenox  100 mg twice a day.  Her past pregnancy history includes multiple pregnancies complicated by early onset severe preeclampsia/HELLP syndrome requiring indicated preterm births at between 84 to 36 weeks.  She also has 2 prior fetal demises at around 22 to 24 weeks.  She delivered her prior pregnancies at [facility] and at the [facility] in [place] .  Sonographic findings Single intrauterine pregnancy at 13w 5d.  Fetal cardiac activity:  Observed and appears normal. Presentation: Breech. The crown-rump length measured today is consistent with a singleton gestation with an [facility] of September 06, 2024, making her 13 weeks and 5 days today. Placenta: Anterior.  Due to maternal body habitus, her ultrasound was extremely difficult to perform and a transvaginal ultrasound had to be performed to visualize the fetus.  The following were discussed during today's consultation:  Advanced maternal age  The increased risk of fetal aneuploidy due to advanced maternal age was discussed.   She had a Panorama cell free DNA test drawn earlier in her pregnancy which did not show any results due to insufficient fetal DNA.  The  patient will return to our office in 2 to 3 weeks to have the Myriad cell free DNA test drawn, which may have a better success of obtaining a result in women with a larger body mass index.  She should not have another Panorama cell free DNA test drawn again.  History of multiple indicated preterm birth due to early onset severe preeclampsia/HELLP syndrome  She was advised that based on her history, she is at risk for developing early onset severe preeclampsia/HELLP syndrome again in this pregnancy.  She was advised to continue taking 2 tablets of baby aspirin  daily (81 mg each) for preeclampsia prophylaxis.  She understands that delivery is the only treatment for preeclampsia/HELLP syndrome.  Hopefully, we will help her reach delivery at a reasonable gestational age.  History of congestive heart failure  She was advised to continue taking Lasix  for the duration of her pregnancy.    We will await the results from her visit with cardio-obstetrics tomorrow and the results from her maternal echocardiogram.  The increased risk of worsening heart failure as her pregnancy progresses was discussed.  History of a prior pulmonary embolus and protein S deficiency  She should continue therapeutic Lovenox  for the duration of her pregnancy and for 6 weeks postpartum.  Her anti-Xa level should be checked monthly to ensure that her anticoagulation is in the therapeutic range (0.7-1.0).   History of 5 prior cesarean deliveries with bowel injury  The increased risk of placenta accreta due to her 5 prior C-sections was discussed.  There were no signs of placenta accreta or placenta previa noted on  today's ultrasound exam.    We will continue to assess for signs of placenta accreta during her future ultrasound exams.  Grand multiparity with multiple medical issues  The patient stated that she would like to have a tubal ligation performed at the time of her repeat cesarean section.  Due to her age,  obesity, and her past medical history, we will continue to follow her with growth ultrasounds throughout her pregnancy.    Weekly fetal testing may be started at around 28 to 32 weeks depending on her medical status.  She will return in 3 weeks to have the myriad cell free DNA test drawn.    She already has a detailed fetal anatomy scan scheduled on April 16, 2024.    The patient stated that all of her questions were answered today.  A total of 60 minutes was spent counseling and coordinating the care for this patient.  Greater than 50% of the time was spent in direct face-to-face contact.

## 2024-03-07 ENCOUNTER — Ambulatory Visit: Attending: Cardiology

## 2024-03-07 ENCOUNTER — Ambulatory Visit (INDEPENDENT_AMBULATORY_CARE_PROVIDER_SITE_OTHER): Admitting: Cardiology

## 2024-03-07 ENCOUNTER — Encounter: Payer: Self-pay | Admitting: Cardiology

## 2024-03-07 VITALS — BP 120/74 | HR 93 | Ht 68.0 in | Wt 391.4 lb

## 2024-03-07 DIAGNOSIS — I5032 Chronic diastolic (congestive) heart failure: Secondary | ICD-10-CM

## 2024-03-07 DIAGNOSIS — O10919 Unspecified pre-existing hypertension complicating pregnancy, unspecified trimester: Secondary | ICD-10-CM

## 2024-03-07 DIAGNOSIS — R0789 Other chest pain: Secondary | ICD-10-CM

## 2024-03-07 DIAGNOSIS — R0609 Other forms of dyspnea: Secondary | ICD-10-CM

## 2024-03-07 DIAGNOSIS — Z86711 Personal history of pulmonary embolism: Secondary | ICD-10-CM

## 2024-03-07 DIAGNOSIS — R42 Dizziness and giddiness: Secondary | ICD-10-CM | POA: Diagnosis not present

## 2024-03-07 DIAGNOSIS — Z8759 Personal history of other complications of pregnancy, childbirth and the puerperium: Secondary | ICD-10-CM

## 2024-03-07 DIAGNOSIS — Z3A13 13 weeks gestation of pregnancy: Secondary | ICD-10-CM

## 2024-03-07 NOTE — Progress Notes (Unsigned)
 Enrolled patient for a 14 day Zio XT  monitor to be mailed to patients home

## 2024-03-07 NOTE — Progress Notes (Unsigned)
 Cardio-Obstetrics Clinic  {Choose New Eval or Follow Up Note:[phone]}   Prior CV Studies Reviewed: The following studies were reviewed today: ***  Past Medical History:  Diagnosis Date   ADHD (attention deficit hyperactivity disorder)    Anxiety    Attention deficit hyperactivity disorder (ADHD), 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  Prescribed by [facility] in [place].  She DC'd with + UPT but would like to restart.  Counseled not recommended in pregnancy.  Counseled to establish with [facility] ASAP as this clinic will not prescribe those types of meds.  Verbal and written information provided today.  Will discuss with MFM at [facility] .     If symptoms not well controlled with counseling/behavioral therapy, re   CHF (congestive heart failure) (HCC)    after admission for vetnal hernia   Coagulation factor disorder (HCC)    DVT (deep venous thrombosis) (HCC)    History of chlamydia infection 09/12/2014   3/19 Called in Azithromycin Rx for positive chlamydia.   4/28:  TOC NEG         02/25/2015  TOC neg 8/10     History of gestational diabetes    History of pre-eclampsia    Miscarriage    Poor dentition 10/10/2014   Records note poor dentition in prior records. Please ensure she is getting regular dental care.     Poor intravenous access 12/09/2014   In last pregnancy, required port access.      Past Surgical History:  Procedure Laterality Date   APPENDECTOMY     CESAREAN SECTION     TONSILLECTOMY     VENTRAL HERNIA REPAIR     { Click here to update PMH, PSH, OB Hx then refresh note  :1}   OB History     Gravida  9   Para  7   Term      Preterm  7   AB  1   Living  5      SAB  1   IAB      Ectopic      Multiple      Live Births  5        Obstetric Comments  Hx GDM, pre-E, clotting disorder, two fetal demise due to blood clot in placenta per pt.  Past deliveries at [facility] and in [place] .         { Click here to update OB Charting  then refresh note  :1}    Current Medications: Current Meds  Medication Sig   albuterol  (VENTOLIN  HFA) 108 (90 Base) MCG/ACT inhaler Inhale 1-2 puffs into the lungs every 6 (six) hours as needed for wheezing or shortness of breath.   aspirin  (ASPIRIN  CHILDRENS) 81 MG chewable tablet Chew 2 tablets (162 mg total) by mouth daily.   docusate sodium  (COLACE) 100 MG capsule Take 1 capsule (100 mg total) by mouth 2 (two) times daily.   enoxaparin  (LOVENOX ) 60 MG/0.6ML injection Inject 1 mL (100 mg total) into the skin every 12 (twelve) hours.   furosemide  (LASIX ) 40 MG tablet Take 1 tablet (40 mg total) by mouth daily.   Iron , Ferrous Sulfate , 325 (65 Fe) MG TABS Take 1 tablet by mouth every other day.   ondansetron  (ZOFRAN -ODT) 4 MG disintegrating tablet Take 1 tablet (4 mg total) by mouth every 8 (eight) hours as needed for nausea or vomiting.   Prenatal Vit-Fe Fumarate-FA (MULTIVITAMIN-PRENATAL) 27-0.8 MG TABS tablet Take 1 tablet by mouth daily at 12  noon.   promethazine  (PHENERGAN ) 6.25 MG/5ML solution Take 5 mLs (6.25 mg total) by mouth 4 (four) times daily as needed for nausea or vomiting.     Allergies:   Prednisone   Social History   Socioeconomic History   Marital status: Single    Spouse name: Not on file   Number of children: Not on file   Years of education: Not on file   Highest education level: Not on file  Occupational History   Not on file  Tobacco Use   Smoking status: Never    Passive exposure: Never   Smokeless tobacco: Never  Vaping Use   Vaping status: Never Used  Substance and Sexual Activity   Alcohol use: Not Currently    Comment: socially before pregnancy   Drug use: No   Sexual activity: Yes    Birth control/protection: None    Comment: Female  Other Topics Concern   Not on file  Social History Narrative   Not on file   Social Drivers of Health   Financial Resource Strain: Not on file  Food Insecurity: Not on file  Transportation Needs: Not on  file  Physical Activity: Not on file  Stress: Not on file  Social Connections: Not on file  { Click here to update SDOH then refresh :1}    Family History  Problem Relation Age of Onset   Depression Mother    COPD Mother    Diabetes Mother    Schizophrenia Father    Drug abuse Sister    { Click here to update FH then refresh note    :1}   ROS:   Please see the history of present illness.    *** All other systems reviewed and are negative.   Labs/EKG Reviewed:    EKG:   EKG is *** ordered today.  The ekg ordered today demonstrates ***  Recent Labs: 01/22/2024: B Natriuretic Peptide 17.7 02/21/2024: ALT 19; BUN 7; Creatinine, Ser 0.62; Hemoglobin 12.1; Platelets 211; Potassium 3.9; Sodium 137   Recent Lipid Panel No results found for: CHOL, TRIG, HDL, CHOLHDL, LDLCALC, LDLDIRECT  Physical Exam:    VS:  BP 120/74 (BP Location: Left Arm, Patient Position: Sitting, Cuff Size: Large)   Pulse 93   Ht 5' 8 (1.727 m)   Wt (!) 391 lb 6.4 oz (177.5 kg)   LMP  (LMP Unknown)   SpO2 96%   BMI 59.51 kg/m     Wt Readings from Last 3 Encounters:  03/07/24 (!) 391 lb 6.4 oz (177.5 kg)  02/21/24 (!) 386 lb 8 oz (175.3 kg)  01/22/24 (!) 393 lb 9.6 oz (178.5 kg)     GEN: *** Well nourished, well developed in no acute distress HEENT: Normal NECK: No JVD; No carotid bruits LYMPHATICS: No lymphadenopathy CARDIAC: ***RRR, no murmurs, rubs, gallops RESPIRATORY:  Clear to auscultation without rales, wheezing or rhonchi  ABDOMEN: Soft, non-tender, non-distended MUSCULOSKELETAL:  No edema; No deformity  SKIN: Warm and dry NEUROLOGIC:  Alert and oriented x 3 PSYCHIATRIC:  Normal affect    Risk Assessment/Risk Calculators:   { Click to calculate CARPREG II - THEN refresh note :1}    { Click to caclulate Mod WHO Class of CV Risk - THEN refresh note :1}     { Click for CHADS2VASc Score - THEN Refresh Note    :[id number]}      ASSESSMENT & PLAN:    *** There  are no Patient Instructions on file for this visit.  Dispo:  No follow-ups on file.   Medication Adjustments/Labs and Tests Ordered: Current medicines are reviewed at length with the patient today.  Concerns regarding medicines are outlined above.  Tests Ordered: No orders of the defined types were placed in this encounter.  Medication Changes: No orders of the defined types were placed in this encounter.

## 2024-03-07 NOTE — Patient Instructions (Addendum)
 Medication Instructions:  Your physician recommends that you continue on your current medications as directed. Please refer to the Current Medication list given to you today.  *If you need a refill on your cardiac medications before your next appointment, please call your pharmacy*   Testing/Procedures: Your physician has requested that you have an echocardiogram-OB. Echocardiography is a painless test that uses sound waves to create images of your heart. It provides your doctor with information about the size and shape of your heart and how well your heart's chambers and valves are working. This procedure takes approximately one hour. There are no restrictions for this procedure. Please do NOT wear cologne, perfume, aftershave, or lotions (deodorant is allowed). Please arrive 15 minutes prior to your appointment time.  Please note: We ask at that you not bring children with you during ultrasound (echo/ vascular) testing. Due to room size and safety concerns, children are not allowed in the ultrasound rooms during exams. Our front office staff cannot provide observation of children in our lobby area while testing is being conducted. An adult accompanying a patient to their appointment will only be allowed in the ultrasound room at the discretion of the ultrasound technician under special circumstances. We apologize for any inconvenience.  ZIO XT- Long Term Monitor Instructions  Your physician has requested you wear a ZIO patch monitor for 7 days.  This is a single patch monitor. [employer] supplies one patch monitor per enrollment. Additional stickers are not available. Please do not apply patch if you will be having a Nuclear Stress Test,  Echocardiogram, Cardiac CT, MRI, or Chest Xray during the period you would be wearing the  monitor. The patch cannot be worn during these tests. You cannot remove and re-apply the  ZIO XT patch monitor.  Your ZIO patch monitor will be mailed 3 day USPS to your  address on file. It may take 3-5 days  to receive your monitor after you have been enrolled.  Once you have received your monitor, please review the enclosed instructions. Your monitor  has already been registered assigning a specific monitor serial # to you.  Billing and Patient Assistance Program Information  We have supplied [employer] with any of your insurance information on file for billing purposes. [employer] offers a sliding scale Patient Assistance Program for patients that do not have  insurance, or whose insurance does not completely cover the cost of the ZIO monitor.  You must apply for the Patient Assistance Program to qualify for this discounted rate.  To apply, please call [employer] at [phone], select option 4, select option 2, ask to apply for  Patient Assistance Program. Meredeth will ask your household income, and how many people  are in your household. They will quote your out-of-pocket cost based on that information.  [employer] will also be able to set up a 21-month, interest-free payment plan if needed.  Applying the monitor   Shave hair from upper left chest.  Hold abrader disc by orange tab. Rub abrader in 40 strokes over the upper left chest as  indicated in your monitor instructions.  Clean area with 4 enclosed alcohol pads. Let dry.  Apply patch as indicated in monitor instructions. Patch will be placed under collarbone on left  side of chest with arrow pointing upward.  Rub patch adhesive wings for 2 minutes. Remove white label marked 1. Remove the white  label marked 2. Rub patch adhesive wings for 2 additional minutes.  While looking in a mirror, press and release  button in center of patch. A small green light will  flash 3-4 times. This will be your only indicator that the monitor has been turned on.  Do not shower for the first 24 hours. You may shower after the first 24 hours.  Press the button if you feel a symptom. You will hear a small click. Record  Date, Time and  Symptom in the Patient Logbook.  When you are ready to remove the patch, follow instructions on the last 2 pages of Patient  Logbook. Stick patch monitor onto the last page of Patient Logbook.  Place Patient Logbook in the blue and white box. Use locking tab on box and tape box closed  securely. The blue and white box has prepaid postage on it. Please place it in the mailbox as  soon as possible. Your physician should have your test results approximately 7 days after the  monitor has been mailed back to [facility].  Call [facility] Customer Care at [phone] if you have questions regarding  your ZIO XT patch monitor. Call them immediately if you see an orange light blinking on your  monitor.  If your monitor falls off in less than 4 days, contact our Monitor department at [phone].  If your monitor becomes loose or falls off after 4 days call [employer] at [phone] for  suggestions on securing your monitor  Follow-Up: At [facility], you and your health needs are our priority.  As part of our continuing mission to provide you with exceptional heart care, our providers are all part of one team.  This team includes your primary Cardiologist (physician) and Advanced Practice Providers or APPs (Physician Assistants and Nurse Practitioners) who all work together to provide you with the care you need, when you need it.  Your next appointment:   12 week(s) Mag or [facility]  Provider:   Kardie Tobb, DO

## 2024-03-09 NOTE — Addendum Note (Signed)
 Addended by: SHEENA PUGH on: 03/09/2024 07:51 PM   Modules accepted: Level of Service

## 2024-03-20 ENCOUNTER — Other Ambulatory Visit (INDEPENDENT_AMBULATORY_CARE_PROVIDER_SITE_OTHER)

## 2024-03-20 ENCOUNTER — Other Ambulatory Visit: Payer: Self-pay

## 2024-03-20 ENCOUNTER — Telehealth: Payer: Self-pay | Admitting: Family Medicine

## 2024-03-20 ENCOUNTER — Other Ambulatory Visit: Payer: Self-pay | Admitting: Obstetrics and Gynecology

## 2024-03-20 ENCOUNTER — Ambulatory Visit: Attending: Obstetrics

## 2024-03-20 ENCOUNTER — Ambulatory Visit (INDEPENDENT_AMBULATORY_CARE_PROVIDER_SITE_OTHER): Admitting: Obstetrics and Gynecology

## 2024-03-20 ENCOUNTER — Ambulatory Visit

## 2024-03-20 VITALS — BP 103/66 | HR 90 | Wt 387.1 lb

## 2024-03-20 DIAGNOSIS — O0992 Supervision of high risk pregnancy, unspecified, second trimester: Secondary | ICD-10-CM | POA: Diagnosis not present

## 2024-03-20 DIAGNOSIS — O10919 Unspecified pre-existing hypertension complicating pregnancy, unspecified trimester: Secondary | ICD-10-CM

## 2024-03-20 DIAGNOSIS — Z9189 Other specified personal risk factors, not elsewhere classified: Secondary | ICD-10-CM

## 2024-03-20 DIAGNOSIS — Z8632 Personal history of gestational diabetes: Secondary | ICD-10-CM

## 2024-03-20 DIAGNOSIS — O99212 Obesity complicating pregnancy, second trimester: Secondary | ICD-10-CM | POA: Diagnosis not present

## 2024-03-20 DIAGNOSIS — O99119 Other diseases of the blood and blood-forming organs and certain disorders involving the immune mechanism complicating pregnancy, unspecified trimester: Secondary | ICD-10-CM

## 2024-03-20 DIAGNOSIS — R112 Nausea with vomiting, unspecified: Secondary | ICD-10-CM

## 2024-03-20 DIAGNOSIS — O26892 Other specified pregnancy related conditions, second trimester: Secondary | ICD-10-CM

## 2024-03-20 DIAGNOSIS — K439 Ventral hernia without obstruction or gangrene: Secondary | ICD-10-CM

## 2024-03-20 DIAGNOSIS — Z98891 History of uterine scar from previous surgery: Secondary | ICD-10-CM

## 2024-03-20 DIAGNOSIS — Z86711 Personal history of pulmonary embolism: Secondary | ICD-10-CM

## 2024-03-20 DIAGNOSIS — Z6841 Body Mass Index (BMI) 40.0 and over, adult: Secondary | ICD-10-CM

## 2024-03-20 DIAGNOSIS — O09292 Supervision of pregnancy with other poor reproductive or obstetric history, second trimester: Secondary | ICD-10-CM | POA: Insufficient documentation

## 2024-03-20 DIAGNOSIS — O285 Abnormal chromosomal and genetic finding on antenatal screening of mother: Secondary | ICD-10-CM

## 2024-03-20 DIAGNOSIS — Z7189 Other specified counseling: Secondary | ICD-10-CM

## 2024-03-20 DIAGNOSIS — O4442 Low lying placenta NOS or without hemorrhage, second trimester: Secondary | ICD-10-CM | POA: Diagnosis not present

## 2024-03-20 DIAGNOSIS — O4440 Low lying placenta NOS or without hemorrhage, unspecified trimester: Secondary | ICD-10-CM

## 2024-03-20 DIAGNOSIS — Z8679 Personal history of other diseases of the circulatory system: Secondary | ICD-10-CM

## 2024-03-20 DIAGNOSIS — E663 Overweight: Secondary | ICD-10-CM | POA: Diagnosis not present

## 2024-03-20 DIAGNOSIS — Z315 Encounter for genetic counseling: Secondary | ICD-10-CM | POA: Diagnosis not present

## 2024-03-20 DIAGNOSIS — Z3A15 15 weeks gestation of pregnancy: Secondary | ICD-10-CM

## 2024-03-20 DIAGNOSIS — O0990 Supervision of high risk pregnancy, unspecified, unspecified trimester: Secondary | ICD-10-CM

## 2024-03-20 DIAGNOSIS — O99112 Other diseases of the blood and blood-forming organs and certain disorders involving the immune mechanism complicating pregnancy, second trimester: Secondary | ICD-10-CM | POA: Diagnosis not present

## 2024-03-20 DIAGNOSIS — O09529 Supervision of elderly multigravida, unspecified trimester: Secondary | ICD-10-CM | POA: Insufficient documentation

## 2024-03-20 DIAGNOSIS — D6859 Other primary thrombophilia: Secondary | ICD-10-CM

## 2024-03-20 DIAGNOSIS — O10912 Unspecified pre-existing hypertension complicating pregnancy, second trimester: Secondary | ICD-10-CM | POA: Diagnosis not present

## 2024-03-20 DIAGNOSIS — O2622 Pregnancy care for patient with recurrent pregnancy loss, second trimester: Secondary | ICD-10-CM | POA: Diagnosis present

## 2024-03-20 DIAGNOSIS — O09522 Supervision of elderly multigravida, second trimester: Secondary | ICD-10-CM | POA: Diagnosis not present

## 2024-03-20 DIAGNOSIS — R300 Dysuria: Secondary | ICD-10-CM

## 2024-03-20 LAB — POCT URINALYSIS DIP (DEVICE)
Glucose, UA: NEGATIVE mg/dL
Hgb urine dipstick: NEGATIVE
Ketones, ur: NEGATIVE mg/dL
Leukocytes,Ua: NEGATIVE
Nitrite: NEGATIVE
Protein, ur: 30 mg/dL — AB
Specific Gravity, Urine: >=1.03 (ref 1.005–1.030)
Urobilinogen, UA: 1 mg/dL (ref 0.0–1.0)
pH: 6 (ref 5.0–8.0)

## 2024-03-20 MED ORDER — BLOOD PRESSURE MONITORING DEVI
1.0000 | 0 refills | Status: AC
Start: 2024-03-20 — End: ?

## 2024-03-20 MED ORDER — CEFADROXIL 500 MG PO CAPS: 500.0000 mg | ORAL_CAPSULE | Freq: Two times a day (BID) | ORAL | 0 refills | Status: DC

## 2024-03-20 MED ORDER — ONDANSETRON 4 MG PO TBDP: 4.0000 mg | ORAL_TABLET | Freq: Three times a day (TID) | ORAL | 2 refills | Status: DC | PRN

## 2024-03-20 NOTE — Progress Notes (Addendum)
 [facility] at [facility] [address], [address] Phone:  [phone]   Fax:  [phone]      In-Person Genetic Counseling Clinic Note:   I spoke with 38 y.o. Brooke Sandoval today to discuss her NIPS results. She was referred by Zina Jerilynn LABOR, MD.   Pregnancy History:    [id number]. EGA: [redacted]w[redacted]d by US . EDD: 09/06/2024. [name] has five children who were all born premature. She had two fetal demises as ~24w. She had one early SAB.  Personal history of congestive heart failure and deep vein thrombosis. 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 Reports possible UTI. She has an OB appointment today and will inform them of her symptoms. Denies bleeding, infections, and fevers in this pregnancy. Denies using tobacco, alcohol, or street drugs in this pregnancy.   Family History:    A three-generation pedigree was created and scanned into Epic under the Media tab.  Patient had two fetal demises at ~24w. She reports her five children were born premature at 23-35 weeks. Two of her children have learning delays. Her daughter with LD also has bronchomalacia. Her son with LD also has mental health concerns. Her 44 yo son has tracheomalacia. She states that she was told these symptoms are likely due to the prematurity. We reviewed without known etiology of the fetal demises or additional genetic testing performed, recurrence risk is difficult to estimate. Her children can be referred to pediatric genetics as indicated. She will be monitored with ultrasounds at our clinic due to her pregnancy history.  She reports her sister also had a fetal demise of unknown cause; she has 7 healthy children. She reports her brother died as an infant from SIDS. She also reports a maternal first cousin who has 2 daughters with Down syndrome. We discussed that most cases of Down syndrome occur by chance. However, without medical and genetic testing reports, familial causes to Down  syndrome or the possibility of another genetic condition cannot be entirely ruled out. We will screen for T21 with NIPS. If  learns of additional information regarding her family member's diagnoses, we are happy to revisit and discuss further a recurrence risk is limited otherwise.   Patient ethnicity reported as White and FOB ethnicity reported as Hispanic. Denies Ashkenazi Jewish ancestry.  Family history otherwise not remarkable for consanguinity, individuals with birth defects, intellectual disability, autism spectrum disorder, multiple spontaneous abortions, still births, or unexplained neonatal death.   Low Fetal Fraction on NIPS:  Noninvasive prenatal screening (NIPS) analyzes cell-free DNA originating from the placenta that is found in the maternal blood circulation during pregnancy to provide information regarding the presence or absence of extra DNA for chromosomes 13, 18, and 21 as well as the sex chromosomes. [name]'s NIPS results from [facility] lab returned as no-call due to low fetal fraction. Her first draw had a low fetal fraction of 2.7% at [redacted]w[redacted]d. Due to this low fetal fraction, the lab was not able to estimate risks for common chromosomal differences. Low fetal fraction can be associated with early gestational age, high maternal weight, suboptimal sample collection, pregnancies conceived using in vitro fertilization, use of certain medications such as blood thinners, certain maternal health conditions such as some autoimmune disorders, and fetal chromosomal abnormalities. Moreover, this result may be due to normal variation.  We discussed that it is currently unknown why [name] has low fetal fraction; however, it may be related to high maternal weight and early gestational age  when the sample was collected. However, it can be associated to a chromosome difference in the fetus, as pregnancies with aneuploidy can also result in low fetal fraction. Based on these results, we discussed further  testing/screening options including repeat NIPS through [facility] or a different laboratory, diagnostic testing through amniocentesis, and monitoring through ultrasounds. The benefits and limitations of each were reviewed including the 1 in 500 risk for miscarriage by amniocentesis. We reviewed that no screen can act as a diagnostic tool, and low-risk NIPS and/or normal ultrasounds do not guarantee a healthy pregnancy. Moreover, other labs that offer NIPS are not able to screen for triploidy. She elected [facility] NIPS.  Advanced Maternal Age:  We briefly discussed that the chance that a fetus would be affected with a chromosome difference increases with advanced maternal age. Ainara's current age-related risk to have a pregnancy affected with a chromosome difference is approximately 1 in 63 (~1.6%).  We discussed and offered the option of amniocentesis. [name] declined amniocentesis.    Newborn Screening. The [place]  Newborn Screening (NBS) program will screen all newborn babies for cystic fibrosis, spinal muscular atrophy, hemoglobinopathies, and numerous other conditions.  Previous Testing Completed:  Negative carrier screening: [name] previously completed [facility] carrier screening. She screened to not be a carrier for cystic fibrosis (CF), spinal muscular atrophy (SMA), alpha thalassemia, and beta hemoglobinopathies. Please see report for details. A negative result on carrier screening reduces but does not eliminate the chance of being a carrier.    Plan of Care:   [facility] NIPS drawn today. We will call the patient with the results. Declined amniocentesis. [facility] anatomy ultrasound on 04/16/2024.   Informed consent was obtained. All questions were answered.   85 minutes were spent on the date of the encounter in service to the patient including preparation, face-to-face consultation, discussion of test reports and available next steps, pedigree construction, genetic risk assessment,  documentation, and care coordination.    Thank you for sharing in the care of Kizzie with us .  Please do not hesitate to contact us  at [phone] if you have any questions.   Lauraine Bodily, MS, [facility] Certified Genetic Counselor   Genetic counseling student involved in appointment: No.

## 2024-03-20 NOTE — Telephone Encounter (Signed)
 Patient declined scheduling lab only visit. She says she did it today.

## 2024-03-20 NOTE — Progress Notes (Signed)
 PRENATAL VISIT NOTE  Subjective:  Brooke Sandoval is a 38 y.o. [id number] at [redacted]w[redacted]d being seen today for ongoing prenatal care.  She is currently monitored for the following issues for this high-risk pregnancy and has Anxiety; Asthma affecting pregnancy, antepartum; BMI 50.0-59.9, adult (HCC); Chronic hypertension during pregnancy, antepartum; Previous cesarean delivery affecting pregnancy, antepartum; history of 5 c sections; Hx of pulmonary embolus during pregnancy; History of substance abuse (HCC); History of pre-eclampsia in prior pregnancy, currently pregnant in first trimester; Protein S deficiency affecting pregnancy, antepartum; History of gestational diabetes; Ventral hernia without obstruction or gangrene; History of congestive heart failure; Supervision of high risk pregnancy, antepartum; RED chart patient; At risk for injury related to surgical procedure, history of bowel injury; and Placenta positioned low in anterior wall of uterus on their problem list.  Patient doing well with no acute concerns today. She reports headache the previous evening and a sore area from injecting her lovenox .   . Vag. Bleeding: Scant.   . Denies leaking of fluid.   The following portions of the patient's history were reviewed and updated as appropriate: allergies, current medications, past family history, past medical history, past social history, past surgical history and problem list. Problem list updated.  Objective:   Vitals:   03/20/24 1428  BP: 103/66  Pulse: 90  Weight: (!) 387 lb 1.6 oz (175.6 kg)    Fetal Status:           General:  Alert, oriented and cooperative. Patient is in no acute distress.  Skin: Skin is warm and dry. No rash noted.   Cardiovascular: Normal heart rate noted  Respiratory: Normal respiratory effort, no problems with respiration noted  Abdomen: Soft, gravid, appropriate for gestational age.  Pain/Pressure: Present     Pelvic: Cervical exam deferred        Extremities:  Normal range of motion.     Mental Status:  Normal mood and affect. Normal behavior. Normal judgment and thought content.   Assessment and Plan:  Pregnancy: [id number] at [redacted]w[redacted]d  1. Supervision of high risk pregnancy, antepartum (Primary) Continue routine prenatal care Pt is getting NIPS from [facility] per MFM request and previous poor placental fraction  - AFP, Serum, Open Spina Bifida - Blood Pressure Monitoring DEVI; 1 each by Does not apply route once a week.  Dispense: 1 each; Refill: 0 - US  OB Transvaginal; Future  2. Chronic hypertension during pregnancy, antepartum BP currently normal without meds, reviewed OB cards note and pt is to use lasix  every other day, still awaiting maternal cardiac monitoring  3. Protein S deficiency affecting pregnancy, antepartum Continue lovenox , per MFM need to check factor Xa level monthly  4. Ventral hernia without obstruction or gangrene   5. RED chart patient Pt previously discussed at meeting, MFM is aware and is following most concerns at this time.  6. Placenta positioned low in anterior wall of uterus Will need to follow to rule out accreta spectrum  7. Hx of pulmonary embolus during pregnancy Continue lovenox   8. History of gestational diabetes Initial A1c normal  9. History of congestive heart failure Pt followed by OB cards, maternal echo ordered  10. history of 5 c sections Will need repeat c section and BTL if possible  11. BMI 50.0-59.9, adult (HCC)   12. At risk for injury related to surgical procedure, history of bowel injury Discuss with general surgery as pregnancy progresses  13. Nausea and vomiting, unspecified vomiting type Refill of zofran  given - ondansetron  (  ZOFRAN -ODT) 4 MG disintegrating tablet; Take 1 tablet (4 mg total) by mouth every 8 (eight) hours as needed for nausea or vomiting.  Dispense: 30 tablet; Refill: 2  Preterm labor symptoms and general obstetric precautions including but not limited to  vaginal bleeding, contractions, leaking of fluid and fetal movement were reviewed in detail with the patient.  Please refer to After Visit Summary for other counseling recommendations.   Return in about 3 weeks (around 04/10/2024) for [facility], in person.   Jerilynn Buddle, MD Faculty Attending Center for [facility]

## 2024-03-21 ENCOUNTER — Telehealth: Admitting: Family Medicine

## 2024-03-21 ENCOUNTER — Other Ambulatory Visit: Payer: Self-pay | Admitting: Medical Genetics

## 2024-03-21 DIAGNOSIS — B001 Herpesviral vesicular dermatitis: Secondary | ICD-10-CM | POA: Diagnosis not present

## 2024-03-21 MED ORDER — ACYCLOVIR 5 % EX CREA: 1.0000 | TOPICAL_CREAM | Freq: Four times a day (QID) | CUTANEOUS | 0 refills | Status: DC | PRN

## 2024-03-21 MED ORDER — VALACYCLOVIR HCL 1 G PO TABS: 2000.0000 mg | ORAL_TABLET | Freq: Two times a day (BID) | ORAL | 0 refills | Status: AC

## 2024-03-21 NOTE — Progress Notes (Signed)

## 2024-03-21 NOTE — Addendum Note (Signed)
 Addended by: VIVIENNE DELON HERO on: 03/21/2024 12:09 PM   Modules accepted: Orders

## 2024-03-25 LAB — AFP, SERUM, OPEN SPINA BIFIDA
AFP MoM: 1.01
AFP Value: 18.3 ng/mL
Gest. Age on Collection Date: 15 wk
Maternal Age At EDD: 40.6 a
OSBR Risk 1 IN: 10000
Test Results:: NEGATIVE
Weight: 387 [lb_av]

## 2024-03-26 ENCOUNTER — Ambulatory Visit: Payer: Self-pay | Admitting: Obstetrics and Gynecology

## 2024-03-28 ENCOUNTER — Other Ambulatory Visit: Payer: Self-pay

## 2024-03-28 ENCOUNTER — Telehealth: Payer: Self-pay | Admitting: Family Medicine

## 2024-03-28 ENCOUNTER — Encounter (HOSPITAL_COMMUNITY): Payer: Self-pay | Admitting: Family Medicine

## 2024-03-28 ENCOUNTER — Inpatient Hospital Stay (HOSPITAL_COMMUNITY)
Admission: AD | Admit: 2024-03-28 | Discharge: 2024-03-28 | Disposition: A | Discharge status: Home / Self Care | Attending: Obstetrics & Gynecology | Admitting: Obstetrics & Gynecology

## 2024-03-28 ENCOUNTER — Inpatient Hospital Stay (HOSPITAL_COMMUNITY)

## 2024-03-28 ENCOUNTER — Inpatient Hospital Stay (HOSPITAL_BASED_OUTPATIENT_CLINIC_OR_DEPARTMENT_OTHER)

## 2024-03-28 DIAGNOSIS — O26612 Liver and biliary tract disorders in pregnancy, second trimester: Secondary | ICD-10-CM | POA: Diagnosis not present

## 2024-03-28 DIAGNOSIS — K760 Fatty (change of) liver, not elsewhere classified: Secondary | ICD-10-CM | POA: Diagnosis not present

## 2024-03-28 DIAGNOSIS — O2242 Hemorrhoids in pregnancy, second trimester: Secondary | ICD-10-CM | POA: Insufficient documentation

## 2024-03-28 DIAGNOSIS — O26892 Other specified pregnancy related conditions, second trimester: Secondary | ICD-10-CM | POA: Diagnosis not present

## 2024-03-28 DIAGNOSIS — R109 Unspecified abdominal pain: Secondary | ICD-10-CM

## 2024-03-28 DIAGNOSIS — Z3A16 16 weeks gestation of pregnancy: Secondary | ICD-10-CM

## 2024-03-28 LAB — COMPREHENSIVE METABOLIC PANEL WITH GFR
ALT: 14 U/L (ref 0–44)
AST: 12 U/L — ABNORMAL LOW (ref 15–41)
Albumin: 2.8 g/dL — ABNORMAL LOW (ref 3.5–5.0)
Alkaline Phosphatase: 55 U/L (ref 38–126)
Anion gap: 9 (ref 5–15)
BUN: 5 mg/dL — ABNORMAL LOW (ref 6–20)
CO2: 23 mmol/L (ref 22–32)
Calcium: 9 mg/dL (ref 8.9–10.3)
Chloride: 105 mmol/L (ref 98–111)
Creatinine, Ser: 0.68 mg/dL (ref 0.44–1.00)
GFR, Estimated: >60 mL/min (ref >60)
Glucose, Bld: 85 mg/dL (ref 70–99)
Potassium: 3.6 mmol/L (ref 3.5–5.1)
Sodium: 137 mmol/L (ref 135–145)
Total Bilirubin: 0.6 mg/dL (ref 0.0–1.2)
Total Protein: 6.7 g/dL (ref 6.5–8.1)

## 2024-03-28 LAB — CBC WITH DIFFERENTIAL/PLATELET
Abs Immature Granulocytes: 0.07 K/uL (ref 0.00–0.07)
Basophils Absolute: 0 K/uL (ref 0.0–0.1)
Basophils Relative: 0 %
Eosinophils Absolute: 0.2 K/uL (ref 0.0–0.5)
Eosinophils Relative: 2 %
HCT: 34.5 % — ABNORMAL LOW (ref 36.0–46.0)
Hemoglobin: 11.1 g/dL — ABNORMAL LOW (ref 12.0–15.0)
Immature Granulocytes: 1 %
Lymphocytes Relative: 21 %
Lymphs Abs: 2.3 K/uL (ref 0.7–4.0)
MCH: 26.7 pg (ref 26.0–34.0)
MCHC: 32.2 g/dL (ref 30.0–36.0)
MCV: 82.9 fL (ref 80.0–100.0)
Monocytes Absolute: 0.4 K/uL (ref 0.1–1.0)
Monocytes Relative: 4 %
Neutro Abs: 8.2 K/uL — ABNORMAL HIGH (ref 1.7–7.7)
Neutrophils Relative %: 72 %
Platelets: 212 K/uL (ref 150–400)
RBC: 4.16 MIL/uL (ref 3.87–5.11)
RDW: 18.2 % — ABNORMAL HIGH (ref 11.5–15.5)
WBC: 11.2 K/uL — ABNORMAL HIGH (ref 4.0–10.5)
nRBC: 0 % (ref 0.0–0.2)

## 2024-03-28 LAB — AMYLASE: Amylase: 36 U/L (ref 28–100)

## 2024-03-28 LAB — LIPASE, BLOOD: Lipase: 20 U/L (ref 11–51)

## 2024-03-28 MED ORDER — ONDANSETRON 4 MG PO TBDP
8.0000 mg | ORAL_TABLET | Freq: Once | ORAL | Status: AC
  Administered 2024-03-28: 8 mg via ORAL
  Filled 2024-03-28: qty 2

## 2024-03-28 MED ORDER — OXYCODONE-ACETAMINOPHEN 5-325 MG PO TABS
2.0000 | ORAL_TABLET | Freq: Once | ORAL | Status: AC
  Administered 2024-03-28: 2 via ORAL
  Filled 2024-03-28: qty 2

## 2024-03-28 MED ORDER — MORPHINE SULFATE (PF) 4 MG/ML IV SOLN
8.0000 mg | Freq: Once | INTRAVENOUS | Status: AC
  Administered 2024-03-28: 8 mg via INTRAMUSCULAR
  Filled 2024-03-28: qty 2

## 2024-03-28 NOTE — MAU Note (Signed)
 Brooke Sandoval is a 38 y.o. at [redacted]w[redacted]d here in MAU reporting: severe abdominal pain for 3 days. States she can't eat or sleep. States when she eats or drinks it makes the pain worse. Reports entire abdomen hurts feels like a burning pain and stabbing. States it is enough pain that it hurts to breathe. Reporting bleeding that she thinks is rectal bleeding for 3 days. Pt does have bad hemorrhoids. Took abx last week for a kidney infection and had a reaction in her mouth. Patient denies any unusual vaginal discharge, vaginal odor, itching or pain.  Denies any recent sexual intercourse. Has tried tylenol  and ibuprofen with no relief.   Onset of complaint: 3 days ago  Pain score: abd 9 hemorrhoids 9  Vitals:   03/28/24 1837  Pulse: (!) 102  Resp: (!) 24  Temp: 97.7 F (36.5 C)  SpO2: 96%     [id number], will attempt once in room  Lab orders placed from triage:

## 2024-03-28 NOTE — Telephone Encounter (Signed)
 Patient called and stated she has been in pain for three days. She does not want to go to the hospital where she will have to sit for hours. She would like to be worked in.

## 2024-03-28 NOTE — Telephone Encounter (Signed)
 Pt reports abdominal pain for three days and isn't sure if its from constipation or her uterus, she states she has not started to feel fetal movement yet during this pregnancy.  She also reports very irritated hemorrhoids that are bleeding even when she is not currently using restroom.  Pt requesting to be seen by doctor in office, RN advised pt to be seen at [facility] at [facility] due to her concerns that's abdominal pain could be related to pregnancy. Pt verbalized understanding and states she plans to go to MAU.    [place], RN

## 2024-03-28 NOTE — MAU Provider Note (Signed)
 History     CSN: [id number]  Arrival date and time: 03/28/24 1742   Event Date/Time   First Provider Initiated Contact with Patient 03/28/24 2041      Chief Complaint  Patient presents with   Abdominal Pain    Brooke Sandoval is a 38 y.o. [id number] at [redacted]w[redacted]d who receives care at [facility].  She presents today for abdominal pain.  She states she had a bowel movement ~ 2 days ago and caused my hemorrhoids to come out.  She reports having blood from hemorrhoids, but also abdominal pain that is causing issues with sleeping and eating. She describes the pain as constant sharp pain, but not like contractions. She goes on to state it feels like someone has filled my stomach up with gas.  She states the pain has no worsening or relieving factors and rates it a 7/10.  She also reports h/o fetal demise x 2 and is concerned because this is similar.   She reports last eating two bites of ice cream at 9pm.  She states she hasn't had anything today. She reports taking tylenol  (500mg ) at midnight and ibuprofen (800) at 8pm.  OB History     Gravida  9   Para  7   Term      Preterm  7   AB  1   Living  5      SAB  1   IAB      Ectopic      Multiple      Live Births  5        Obstetric Comments  Hx GDM, pre-E, clotting disorder, two fetal demise due to blood clot in placenta per pt.  Past deliveries at [facility] and in [place] .         Past Medical History:  Diagnosis Date   ADHD (attention deficit hyperactivity disorder)    Anxiety    Attention deficit hyperactivity disorder (ADHD), 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  Prescribed by [facility] in [place].  She DC'd with + UPT but would like to restart.  Counseled not recommended in pregnancy.  Counseled to establish with [facility] ASAP as this clinic will not prescribe those types of meds.  Verbal and written information provided today.  Will discuss with MFM at [facility] .     If symptoms not well controlled with  counseling/behavioral therapy, re   CHF (congestive heart failure) (HCC)    after admission for vetnal hernia   Coagulation factor disorder    DVT (deep venous thrombosis) (HCC)    History of chlamydia infection 09/12/2014   3/19 Called in Azithromycin Rx for positive chlamydia.   4/28:  TOC NEG         02/25/2015  TOC neg 8/10     History of gestational diabetes    History of pre-eclampsia    Miscarriage    Poor dentition 10/10/2014   Records note poor dentition in prior records. Please ensure she is getting regular dental care.     Poor intravenous access 12/09/2014   In last pregnancy, required port access.      Past Surgical History:  Procedure Laterality Date   APPENDECTOMY     CESAREAN SECTION     TONSILLECTOMY     VENTRAL HERNIA REPAIR      Family History  Problem Relation Age of Onset   Depression Mother    COPD Mother    Diabetes Mother    Schizophrenia Father  Drug abuse Sister     Social History   Tobacco Use   Smoking status: Never    Passive exposure: Never   Smokeless tobacco: Never  Vaping Use   Vaping status: Never Used  Substance Use Topics   Alcohol use: Not Currently    Comment: socially before pregnancy   Drug use: No    Allergies:  Allergies  Allergen Reactions   Prednisone Swelling    Medications Prior to Admission  Medication Sig Dispense Refill Last Dose/Taking   albuterol  (VENTOLIN  HFA) 108 (90 Base) MCG/ACT inhaler Inhale 1-2 puffs into the lungs every 6 (six) hours as needed for wheezing or shortness of breath. 1 each 1 Past Week   aspirin  (ASPIRIN  CHILDRENS) 81 MG chewable tablet Chew 2 tablets (162 mg total) by mouth daily. 60 tablet 11 03/28/2024   cefadroxil  (DURICEF) 500 MG capsule Take 1 capsule (500 mg total) by mouth 2 (two) times daily. 14 capsule 0 Past Week   docusate sodium  (COLACE) 100 MG capsule Take 1 capsule (100 mg total) by mouth 2 (two) times daily. 60 capsule 2 Past Week   enoxaparin  (LOVENOX ) 60 MG/0.6ML  injection Inject 1 mL (100 mg total) into the skin every 12 (twelve) hours. 60 mL 9 03/28/2024   furosemide  (LASIX ) 40 MG tablet Take 1 tablet (40 mg total) by mouth daily. 30 tablet 11 03/27/2024   ondansetron  (ZOFRAN -ODT) 4 MG disintegrating tablet Take 1 tablet (4 mg total) by mouth every 8 (eight) hours as needed for nausea or vomiting. 30 tablet 2 03/27/2024   Prenatal Vit-Fe Fumarate-FA (MULTIVITAMIN-PRENATAL) 27-0.8 MG TABS tablet Take 1 tablet by mouth daily at 12 noon.   03/27/2024   acyclovir  cream (ZOVIRAX ) 5 % Apply 1 Application topically every 6 (six) hours as needed. 15 g 0 Unknown   Blood Pressure Monitoring DEVI 1 each by Does not apply route once a week. 1 each 0    Iron , Ferrous Sulfate , 325 (65 Fe) MG TABS Take 1 tablet by mouth every other day. 30 tablet 3    promethazine  (PHENERGAN ) 6.25 MG/5ML solution Take 5 mLs (6.25 mg total) by mouth 4 (four) times daily as needed for nausea or vomiting. 120 mL 1 Unknown    Review of Systems  Constitutional:  Negative for chills and fever.  Gastrointestinal:  Positive for constipation, diarrhea (Loose stools), nausea and vomiting (None today). Negative for abdominal pain.  Genitourinary:  Negative for difficulty urinating, dysuria, vaginal bleeding and vaginal discharge.  Neurological:  Positive for dizziness. Negative for light-headedness and headaches.   Physical Exam   Pulse (!) 102, temperature 97.7 F (36.5 C), temperature source Oral, resp. rate (!) 24, height 5' 8 (1.727 m), weight (!) 175.6 kg, SpO2 96%.  Physical Exam Vitals reviewed. Exam conducted with a chaperone present Deniece, RN).  Constitutional:      General: She is in acute distress.     Appearance: She is well-developed. She is obese.  HENT:     Head: Normocephalic and atraumatic.  Eyes:     Conjunctiva/sclera: Conjunctivae normal.  Cardiovascular:     Rate and Rhythm: Tachycardia present.  Pulmonary:     Effort: Pulmonary effort is normal. No respiratory  distress.  Abdominal:     Palpations: Abdomen is soft.     Tenderness: There is generalized abdominal tenderness.  Musculoskeletal:        General: Normal range of motion.     Cervical back: Normal range of motion.  Skin:    General:  Skin is warm and dry.     Comments: Bruising on abdomen s/t injections.   Neurological:     Mental Status: She is alert.  Psychiatric:        Mood and Affect: Mood normal.        Behavior: Behavior normal.     MAU Course  Procedures Results for orders placed or performed during the hospital encounter of 03/28/24 (from the past 72 hours)  Comprehensive metabolic panel     Status: Abnormal   Collection Time: 03/28/24  9:05 PM  Result Value Ref Range   Sodium 137 135 - 145 mmol/L   Potassium 3.6 3.5 - 5.1 mmol/L   Chloride 105 98 - 111 mmol/L   CO2 23 22 - 32 mmol/L   Glucose, Bld 85 70 - 99 mg/dL    Comment: Glucose reference range applies only to samples taken after fasting for at least 8 hours.   BUN 5 (L) 6 - 20 mg/dL   Creatinine, Ser 9.31 0.44 - 1.00 mg/dL   Calcium 9.0 8.9 - 89.6 mg/dL   Total Protein 6.7 6.5 - 8.1 g/dL   Albumin 2.8 (L) 3.5 - 5.0 g/dL   AST 12 (L) 15 - 41 U/L   ALT 14 0 - 44 U/L   Alkaline Phosphatase 55 38 - 126 U/L   Total Bilirubin 0.6 0.0 - 1.2 mg/dL   GFR, Estimated >39 >39 mL/min    Comment: (NOTE) Calculated using the CKD-EPI Creatinine Equation (2021)    Anion gap 9 5 - 15    Comment: Performed at [facility], [address], [place], [place] [address]  CBC with Differential/Platelet     Status: Abnormal   Collection Time: 03/28/24  9:05 PM  Result Value Ref Range   WBC 11.2 (H) 4.0 - 10.5 K/uL   RBC 4.16 3.87 - 5.11 MIL/uL   Hemoglobin 11.1 (L) 12.0 - 15.0 g/dL   HCT 65.4 (L) 63.9 - 53.9 %   MCV 82.9 80.0 - 100.0 fL   MCH 26.7 26.0 - 34.0 pg   MCHC 32.2 30.0 - 36.0 g/dL   RDW 81.7 (H) 88.4 - 84.4 %   Platelets 212 150 - 400 K/uL   nRBC 0.0 0.0 - 0.2 %   Neutrophils Relative % 72 %   Neutro  Abs 8.2 (H) 1.7 - 7.7 K/uL   Lymphocytes Relative 21 %   Lymphs Abs 2.3 0.7 - 4.0 K/uL   Monocytes Relative 4 %   Monocytes Absolute 0.4 0.1 - 1.0 K/uL   Eosinophils Relative 2 %   Eosinophils Absolute 0.2 0.0 - 0.5 K/uL   Basophils Relative 0 %   Basophils Absolute 0.0 0.0 - 0.1 K/uL   Immature Granulocytes 1 %   Abs Immature Granulocytes 0.07 0.00 - 0.07 K/uL    Comment: Performed at [facility], [address], [place], [place] [address]  Amylase     Status: None   Collection Time: 03/28/24  9:05 PM  Result Value Ref Range   Amylase 36 28 - 100 U/L    Comment: Performed at [facility], [address], [place], [place] [address]  Lipase, blood     Status: None   Collection Time: 03/28/24  9:05 PM  Result Value Ref Range   Lipase 20 11 - 51 U/L    Comment: Performed at [facility], [address] [place], [place] [address]     US  ABDOMEN  LIMITED RUQ (LIVER/GB) Result Date: 03/28/2024 CLINICAL DATA:  Abdominal pain x3 days. EXAM: ULTRASOUND ABDOMEN LIMITED RIGHT UPPER QUADRANT COMPARISON:  None Available. FINDINGS: Gallbladder: No gallstones or wall thickening visualized (1.9 mm). No sonographic Murphy sign noted by sonographer. Common bile duct: Diameter: 1.6 mm Liver: No focal lesion identified. Diffusely increased echogenicity of the liver parenchyma is noted. Portal vein is patent on color Doppler imaging with normal direction of blood flow towards the liver. Other: The study is technically limited secondary to the patient's body habitus, as per the ultrasound technologist. IMPRESSION: Hepatic steatosis. Electronically Signed   By: Suzen Dials M.D.   On: 03/28/2024 22:15     MDM Labs: CBC/D, CMP, Amylase, Lipase Pain Medication Antiemetic US : Abdomen/TV Assessment and Plan  38 year old, [id number]  SIUP at 16.6 weeks Abdominal Pain Hemorrhoids Nausea  -Reviewed POC with patient. -Exam performed and findings discussed.  -Patient  declines examination of hemorrhoids. Discussed OTC medications for treatment.  -Labs ordered.  -Patient offered and accepts pain medication. -Morphine ordered.  -Zofran  for nausea. -BSUS attempted, but unsuccessful d/t panus.  -Will send for TVUS. -Will also send for abdominal US  to r/o gallstones.  JAKALYN KRATKY 03/28/2024, 8:41 PM   Reassessment (11:17 PM) -Results as above.  -Reviewed findings with patient. -Discussed modifications in nutritional intake to avoid worsening of symptoms. Reviewed management of pain with OTC medications.  -Information placed in AVS.  -Reassured that TVUS showing live fetus.  -Patient requests additional pain medication prior to discharge. Percocet ordered.  -Encouraged to call primary office or return to MAU if symptoms worsen or with the onset of new symptoms. -Discharged to home in stable condition.  Harlene LITTIE Duncans MSN, CNM Advanced Practice Provider, [facility] [employer]

## 2024-03-31 ENCOUNTER — Telehealth: Payer: Self-pay

## 2024-03-31 NOTE — Telephone Encounter (Signed)
 I spoke with the patient to return her recent Myriad noninvasive prenatal screening (NIPS) results. The result is low risk, consistent with a female fetus. This screening significantly reduces but does not eliminate the chance that the current pregnancy has Down syndrome (trisomy 61), trisomy 4, trisomy 40, and common sex chromosome conditions. Please see report for details. There are many genetic conditions that cannot be detected by NIPS.  Georgean Kindle, MS, [facility] Certified Genetic Counselor [facility] [phone]

## 2024-04-09 ENCOUNTER — Ambulatory Visit: Admitting: Cardiology

## 2024-04-10 ENCOUNTER — Ambulatory Visit (INDEPENDENT_AMBULATORY_CARE_PROVIDER_SITE_OTHER): Admitting: Obstetrics & Gynecology

## 2024-04-10 ENCOUNTER — Encounter: Payer: Self-pay | Admitting: Obstetrics & Gynecology

## 2024-04-10 ENCOUNTER — Other Ambulatory Visit: Payer: Self-pay

## 2024-04-10 VITALS — BP 137/83 | HR 87 | Wt 390.0 lb

## 2024-04-10 DIAGNOSIS — K760 Fatty (change of) liver, not elsewhere classified: Secondary | ICD-10-CM

## 2024-04-10 DIAGNOSIS — Z7189 Other specified counseling: Secondary | ICD-10-CM

## 2024-04-10 DIAGNOSIS — O99119 Other diseases of the blood and blood-forming organs and certain disorders involving the immune mechanism complicating pregnancy, unspecified trimester: Secondary | ICD-10-CM

## 2024-04-10 DIAGNOSIS — Z8679 Personal history of other diseases of the circulatory system: Secondary | ICD-10-CM

## 2024-04-10 DIAGNOSIS — O4440 Low lying placenta NOS or without hemorrhage, unspecified trimester: Secondary | ICD-10-CM

## 2024-04-10 DIAGNOSIS — Z98891 History of uterine scar from previous surgery: Secondary | ICD-10-CM

## 2024-04-10 DIAGNOSIS — Z9189 Other specified personal risk factors, not elsewhere classified: Secondary | ICD-10-CM

## 2024-04-10 DIAGNOSIS — Z3A18 18 weeks gestation of pregnancy: Secondary | ICD-10-CM

## 2024-04-10 DIAGNOSIS — O10919 Unspecified pre-existing hypertension complicating pregnancy, unspecified trimester: Secondary | ICD-10-CM

## 2024-04-10 DIAGNOSIS — Z23 Encounter for immunization: Secondary | ICD-10-CM

## 2024-04-10 DIAGNOSIS — D6859 Other primary thrombophilia: Secondary | ICD-10-CM

## 2024-04-10 DIAGNOSIS — O10912 Unspecified pre-existing hypertension complicating pregnancy, second trimester: Secondary | ICD-10-CM

## 2024-04-10 DIAGNOSIS — O99112 Other diseases of the blood and blood-forming organs and certain disorders involving the immune mechanism complicating pregnancy, second trimester: Secondary | ICD-10-CM | POA: Diagnosis not present

## 2024-04-10 DIAGNOSIS — O26612 Liver and biliary tract disorders in pregnancy, second trimester: Secondary | ICD-10-CM

## 2024-04-10 DIAGNOSIS — O0990 Supervision of high risk pregnancy, unspecified, unspecified trimester: Secondary | ICD-10-CM

## 2024-04-10 DIAGNOSIS — O09522 Supervision of elderly multigravida, second trimester: Secondary | ICD-10-CM

## 2024-04-10 DIAGNOSIS — O26619 Liver and biliary tract disorders in pregnancy, unspecified trimester: Secondary | ICD-10-CM

## 2024-04-10 DIAGNOSIS — Z86711 Personal history of pulmonary embolism: Secondary | ICD-10-CM

## 2024-04-10 DIAGNOSIS — K439 Ventral hernia without obstruction or gangrene: Secondary | ICD-10-CM

## 2024-04-10 DIAGNOSIS — O4442 Low lying placenta NOS or without hemorrhage, second trimester: Secondary | ICD-10-CM

## 2024-04-10 NOTE — Progress Notes (Signed)
 PRENATAL VISIT NOTE  Subjective:  Brooke Sandoval is a 38 y.o. [id number] at [redacted]w[redacted]d being seen today for ongoing prenatal care.  She is currently monitored for the following issues for this high-risk pregnancy and has Anxiety; Asthma affecting pregnancy, antepartum; BMI 50.0-59.9, adult (HCC); Chronic hypertension during pregnancy, antepartum; Previous cesarean delivery affecting pregnancy, antepartum; history of 5 c sections; Hx of pulmonary embolus during pregnancy; History of substance abuse (HCC); History of pre-eclampsia in prior pregnancy, currently pregnant in first trimester; Protein S deficiency affecting pregnancy, antepartum; History of gestational diabetes; Ventral hernia without obstruction or gangrene; History of congestive heart failure; Supervision of high risk pregnancy, antepartum; RED chart patient; At risk for injury related to surgical procedure, history of bowel injury; Placenta positioned low in anterior wall of uterus; Low fetal fraction on NIPS; Advanced maternal age in multigravida; and Hepatic steatosis during pregnancy on their problem list.  Patient reports continued upper abdominal pain, was seen in MAU on 03/28/24 and diagnosed with hepatic steatosis. She had normal LFTs, normal amylase and lipase.  Contractions: Not present. Vag. Bleeding: None.   . Denies leaking of fluid.   The following portions of the patient's history were reviewed and updated as appropriate: allergies, current medications, past family history, past medical history, past social history, past surgical history and problem list.   Objective:    Vitals:   04/10/24 1111  BP: 137/83  Pulse: 87  Weight: (!) 390 lb (176.9 kg)    Fetal Status:  Fetal Heart Rate (bpm): 150 (On bedside ultrasound)        General: Alert, oriented and cooperative. Patient is in no acute distress.  Skin: Skin is warm and dry. No rash noted.   Cardiovascular: Normal heart rate noted  Respiratory: Normal respiratory effort,  no problems with respiration noted  Abdomen: Soft, gravid, appropriate for gestational age.  Pain/Pressure: Present (abd)  Diffuse upper abdominal tenderness, especially on the right, no rebound or guarding  Pelvic: Cervical exam deferred        Extremities: Normal range of motion.  Edema: None  Mental Status: Normal mood and affect. Normal behavior. Normal judgment and thought content.   Assessment and Plan:  Pregnancy: [id number] at [redacted]w[redacted]d 1. Chronic hypertension during pregnancy, antepartum (Primary) 2. History of congestive heart failure BP stable, continue Lasix  as prescribed and ASA. She is followed by Cardiology.  3. Hepatic steatosis during pregnancy Will recheck LFTs today, also will refer to GI for further recommendations. - Comprehensive metabolic panel with GFR - Ambulatory referral to Gastroenterology  4. Protein S deficiency affecting pregnancy, antepartum 5. History of pulmonary embolus (PE) Continue Lovenox    6. Ventral hernia without obstruction or gangrene 7. At risk for injury related to surgical procedure, history of bowel injury 8. History of 5 cesarean sections General Surgery will be alerted 2-3 weeks prior to her scheduled repeat cesarean section, they will be involved in her surgery as needed.  9. Placenta positioned low in anterior wall of uterus Has upcoming ultrasound 04/16/24, need to rule out PAS.  10. RED chart patient Patient clinical course is reviewed during monthly meetings with MFM.  11. Flu vaccine need - Flu vaccine trivalent PF, 6mos and older(Flulaval,Afluria,Fluarix,Fluzone) given  12. Multigravida of advanced maternal age in second trimester LR NIPS, will get scans and antenatal testing accordingly  13. [redacted] weeks gestation of pregnancy 14. Supervision of high risk pregnancy, antepartum No other complaints or concerns.  Routine obstetric precautions reviewed.  Please refer to After Visit Summary  for other counseling recommendations.    Return in about 4 weeks (around 05/08/2024) for OFFICE OB VISIT (MD only).  Future Appointments  Date Time Provider Department Center  04/16/2024 10:00 AM [facility] PROVIDER 1 [facility]  04/16/2024 10:30 AM [facility] US3 [facility]  05/02/2024 10:55 AM Zina Jerilynn LABOR, MD [facility]  05/27/2024  2:50 PM [facility] 2 [facility]  05/30/2024  1:20 PM Tobb, Kardie, DO [facility] None    Gloris Hugger, MD

## 2024-04-14 ENCOUNTER — Ambulatory Visit (HOSPITAL_COMMUNITY)

## 2024-04-16 ENCOUNTER — Ambulatory Visit: Attending: Obstetrics and Gynecology | Admitting: Obstetrics

## 2024-04-16 ENCOUNTER — Other Ambulatory Visit: Payer: Self-pay | Admitting: *Deleted

## 2024-04-16 ENCOUNTER — Encounter: Payer: Self-pay | Admitting: Obstetrics & Gynecology

## 2024-04-16 ENCOUNTER — Ambulatory Visit (HOSPITAL_BASED_OUTPATIENT_CLINIC_OR_DEPARTMENT_OTHER)

## 2024-04-16 VITALS — BP 132/51 | HR 82

## 2024-04-16 DIAGNOSIS — E669 Obesity, unspecified: Secondary | ICD-10-CM | POA: Diagnosis not present

## 2024-04-16 DIAGNOSIS — D6859 Other primary thrombophilia: Secondary | ICD-10-CM | POA: Diagnosis not present

## 2024-04-16 DIAGNOSIS — Z98891 History of uterine scar from previous surgery: Secondary | ICD-10-CM

## 2024-04-16 DIAGNOSIS — Z79899 Other long term (current) drug therapy: Secondary | ICD-10-CM | POA: Diagnosis not present

## 2024-04-16 DIAGNOSIS — O09899 Supervision of other high risk pregnancies, unspecified trimester: Secondary | ICD-10-CM

## 2024-04-16 DIAGNOSIS — O34219 Maternal care for unspecified type scar from previous cesarean delivery: Secondary | ICD-10-CM | POA: Insufficient documentation

## 2024-04-16 DIAGNOSIS — O99412 Diseases of the circulatory system complicating pregnancy, second trimester: Secondary | ICD-10-CM

## 2024-04-16 DIAGNOSIS — O09292 Supervision of pregnancy with other poor reproductive or obstetric history, second trimester: Secondary | ICD-10-CM | POA: Diagnosis not present

## 2024-04-16 DIAGNOSIS — Z362 Encounter for other antenatal screening follow-up: Secondary | ICD-10-CM

## 2024-04-16 DIAGNOSIS — K760 Fatty (change of) liver, not elsewhere classified: Secondary | ICD-10-CM

## 2024-04-16 DIAGNOSIS — O09299 Supervision of pregnancy with other poor reproductive or obstetric history, unspecified trimester: Secondary | ICD-10-CM

## 2024-04-16 DIAGNOSIS — O99212 Obesity complicating pregnancy, second trimester: Secondary | ICD-10-CM | POA: Diagnosis not present

## 2024-04-16 DIAGNOSIS — O99112 Other diseases of the blood and blood-forming organs and certain disorders involving the immune mechanism complicating pregnancy, second trimester: Secondary | ICD-10-CM | POA: Insufficient documentation

## 2024-04-16 DIAGNOSIS — O10919 Unspecified pre-existing hypertension complicating pregnancy, unspecified trimester: Secondary | ICD-10-CM

## 2024-04-16 DIAGNOSIS — O09522 Supervision of elderly multigravida, second trimester: Secondary | ICD-10-CM

## 2024-04-16 DIAGNOSIS — O10912 Unspecified pre-existing hypertension complicating pregnancy, second trimester: Secondary | ICD-10-CM | POA: Insufficient documentation

## 2024-04-16 DIAGNOSIS — Z7982 Long term (current) use of aspirin: Secondary | ICD-10-CM | POA: Diagnosis not present

## 2024-04-16 DIAGNOSIS — Z6841 Body Mass Index (BMI) 40.0 and over, adult: Secondary | ICD-10-CM

## 2024-04-16 DIAGNOSIS — Z3A19 19 weeks gestation of pregnancy: Secondary | ICD-10-CM | POA: Insufficient documentation

## 2024-04-16 DIAGNOSIS — Z8759 Personal history of other complications of pregnancy, childbirth and the puerperium: Secondary | ICD-10-CM

## 2024-04-16 DIAGNOSIS — Z363 Encounter for antenatal screening for malformations: Secondary | ICD-10-CM | POA: Insufficient documentation

## 2024-04-16 DIAGNOSIS — Z86711 Personal history of pulmonary embolism: Secondary | ICD-10-CM | POA: Insufficient documentation

## 2024-04-16 DIAGNOSIS — O0942 Supervision of pregnancy with grand multiparity, second trimester: Secondary | ICD-10-CM

## 2024-04-16 DIAGNOSIS — Z8632 Personal history of gestational diabetes: Secondary | ICD-10-CM

## 2024-04-16 DIAGNOSIS — Z7901 Long term (current) use of anticoagulants: Secondary | ICD-10-CM | POA: Diagnosis not present

## 2024-04-16 DIAGNOSIS — O09892 Supervision of other high risk pregnancies, second trimester: Secondary | ICD-10-CM

## 2024-04-16 DIAGNOSIS — O0990 Supervision of high risk pregnancy, unspecified, unspecified trimester: Secondary | ICD-10-CM

## 2024-04-16 DIAGNOSIS — Z8679 Personal history of other diseases of the circulatory system: Secondary | ICD-10-CM | POA: Diagnosis not present

## 2024-04-16 DIAGNOSIS — O322XX0 Maternal care for transverse and oblique lie, not applicable or unspecified: Secondary | ICD-10-CM | POA: Insufficient documentation

## 2024-04-16 DIAGNOSIS — O09291 Supervision of pregnancy with other poor reproductive or obstetric history, first trimester: Secondary | ICD-10-CM

## 2024-04-16 DIAGNOSIS — Z86718 Personal history of other venous thrombosis and embolism: Secondary | ICD-10-CM | POA: Diagnosis not present

## 2024-04-16 NOTE — Progress Notes (Signed)
 MFM Consult Note  Brooke Sandoval is currently at 19 weeks and 4 days.  She was seen due to advanced maternal age (38 years old), maternal obesity with a BMI of 3, history of multiple indicated preterm birth due to early onset severe preeclampsia, history of pulmonary embolus with protein S deficiency, and history of congestive heart failure.  She has 5 prior cesarean deliveries.  She reports that she had a bowel injury during her last C-section.  She was diagnosed with heart failure after one of her prior pregnancies.  She was seen by the cardio obstetrics team who stated that the diagnosis and circumstances of her congestive heart failure is ambiguous.  She was advised to take Lasix  every other day.  She has a maternal echocardiogram scheduled in early December. The patient's risk for a primary cardiac event in pregnancy is 5%.  She had a pulmonary embolus in 2008 and was subsequently found to have a protein S deficiency.  She has been prescribed therapeutic Lovenox  100 mg twice a day. She has antifactor X-a levels ordered to ensure that she is in the therapeutic range.  Her past pregnancy history includes multiple pregnancies complicated by early onset severe preeclampsia/HELLP syndrome requiring indicated preterm births at between 19 to 36 weeks.  She also has 2 prior fetal demises at around 22 to 24 weeks.  She delivered her prior pregnancies at [facility] and at the [facility] in [place] .  Sonographic findings Single intrauterine pregnancy at 19w 4d  Fetal cardiac activity:  Observed and appears normal. Presentation: Transverse, head to maternal left. The views of the fetal anatomy were extremely limited today due to maternal body habitus. Fetal biometry shows the estimated fetal weight at the 46th percentile.  Amniotic fluid: Within normal limits.  MVP: 5.32 cm. Placenta: Anterior. There were no sonographic signs of placenta accreta or placenta previa noted today. Cervical  length: 3.6 cm.  Advanced maternal age  She had the Myriad Prequel Prenatal Screen (cell free DNA test) drawn which indicated a low risk for trisomy 63, 32, and 13.  A female fetus is predicted.  An MSAFP test was 1.01 MoM.  She is comfortable with the low risk indicated by this test and declined an amniocentesis today for definitive prenatal diagnosis.  History of multiple indicated preterm birth due to early onset severe preeclampsia/HELLP syndrome  She was advised that based on her history, she is at risk for developing early onset severe preeclampsia/HELLP syndrome again in this pregnancy.  She was advised to continue taking 2 tablets of baby aspirin  daily (81 mg each) for preeclampsia prophylaxis.  We will continue to follow her with frequent exams to screen for preeclampsia and fetal demise.  Hopefully we can help her reach a reasonable gestational age for delivery.  History of congestive heart failure  She was advised to continue taking Lasix  for the duration of her pregnancy.    We will await the results from her echocardiogram in early December.  The increased risk of worsening heart failure as her pregnancy progresses was discussed.  History of a prior pulmonary embolus and protein S deficiency  She should continue therapeutic Lovenox  for the duration of her pregnancy and for 6 weeks postpartum.  Her anti-Xa level should be checked monthly to ensure that her anticoagulation is in the therapeutic range (0.7-1.0).   History of 5 prior cesarean deliveries with bowel injury  There were no signs of placenta accreta or placenta previa noted on today's ultrasound exam.  We will continue to assess for signs of placenta accreta during her future ultrasound exams.  Grand multiparity with multiple medical issues  The patient stated that she would like to have a tubal ligation performed at the time of her repeat cesarean section.  Due to her age, obesity, and her past medical  history, we will continue to follow her with growth ultrasounds throughout her pregnancy.    Her case will be discussed at the monthly Red Chart round meetings.    She will return in 3 weeks for another ultrasound for fetal assessment.    The patient understands that sometimes it may not be possible to clear all of the views of the fetal anatomy during prenatal ultrasounds.  The patient stated that all of her questions were answered today.  A total of 30 minutes was spent counseling and coordinating the care for this patient.  Greater than 50% of the time was spent in direct face-to-face contact.

## 2024-04-27 ENCOUNTER — Inpatient Hospital Stay (HOSPITAL_COMMUNITY)
Admission: AD | Admit: 2024-04-27 | Discharge: 2024-04-27 | Disposition: A | Discharge status: Home / Self Care | Attending: Obstetrics and Gynecology | Admitting: Obstetrics and Gynecology

## 2024-04-27 ENCOUNTER — Encounter (HOSPITAL_COMMUNITY): Payer: Self-pay | Admitting: Obstetrics and Gynecology

## 2024-04-27 ENCOUNTER — Inpatient Hospital Stay (HOSPITAL_BASED_OUTPATIENT_CLINIC_OR_DEPARTMENT_OTHER)

## 2024-04-27 DIAGNOSIS — O10012 Pre-existing essential hypertension complicating pregnancy, second trimester: Secondary | ICD-10-CM | POA: Insufficient documentation

## 2024-04-27 DIAGNOSIS — O0942 Supervision of pregnancy with grand multiparity, second trimester: Secondary | ICD-10-CM | POA: Insufficient documentation

## 2024-04-27 DIAGNOSIS — O34219 Maternal care for unspecified type scar from previous cesarean delivery: Secondary | ICD-10-CM | POA: Insufficient documentation

## 2024-04-27 DIAGNOSIS — O2692 Pregnancy related conditions, unspecified, second trimester: Secondary | ICD-10-CM | POA: Diagnosis not present

## 2024-04-27 DIAGNOSIS — E669 Obesity, unspecified: Secondary | ICD-10-CM | POA: Diagnosis not present

## 2024-04-27 DIAGNOSIS — O26892 Other specified pregnancy related conditions, second trimester: Secondary | ICD-10-CM | POA: Diagnosis not present

## 2024-04-27 DIAGNOSIS — O99212 Obesity complicating pregnancy, second trimester: Secondary | ICD-10-CM | POA: Insufficient documentation

## 2024-04-27 DIAGNOSIS — R109 Unspecified abdominal pain: Secondary | ICD-10-CM

## 2024-04-27 DIAGNOSIS — Z3A21 21 weeks gestation of pregnancy: Secondary | ICD-10-CM

## 2024-04-27 DIAGNOSIS — Q999 Chromosomal abnormality, unspecified: Secondary | ICD-10-CM | POA: Diagnosis not present

## 2024-04-27 DIAGNOSIS — K760 Fatty (change of) liver, not elsewhere classified: Secondary | ICD-10-CM

## 2024-04-27 DIAGNOSIS — O99891 Other specified diseases and conditions complicating pregnancy: Secondary | ICD-10-CM | POA: Diagnosis present

## 2024-04-27 DIAGNOSIS — O09292 Supervision of pregnancy with other poor reproductive or obstetric history, second trimester: Secondary | ICD-10-CM | POA: Insufficient documentation

## 2024-04-27 DIAGNOSIS — O26899 Other specified pregnancy related conditions, unspecified trimester: Secondary | ICD-10-CM

## 2024-04-27 DIAGNOSIS — O10919 Unspecified pre-existing hypertension complicating pregnancy, unspecified trimester: Secondary | ICD-10-CM

## 2024-04-27 DIAGNOSIS — O9912 Other diseases of the blood and blood-forming organs and certain disorders involving the immune mechanism complicating childbirth: Secondary | ICD-10-CM | POA: Insufficient documentation

## 2024-04-27 DIAGNOSIS — O09522 Supervision of elderly multigravida, second trimester: Secondary | ICD-10-CM | POA: Insufficient documentation

## 2024-04-27 DIAGNOSIS — O09212 Supervision of pregnancy with history of pre-term labor, second trimester: Secondary | ICD-10-CM | POA: Insufficient documentation

## 2024-04-27 LAB — CBC WITH DIFFERENTIAL/PLATELET
Abs Immature Granulocytes: 0.07 K/uL (ref 0.00–0.07)
Basophils Absolute: 0.1 K/uL (ref 0.0–0.1)
Basophils Relative: 0 %
Eosinophils Absolute: 0.1 K/uL (ref 0.0–0.5)
Eosinophils Relative: 1 %
HCT: 33.5 % — ABNORMAL LOW (ref 36.0–46.0)
Hemoglobin: 11 g/dL — ABNORMAL LOW (ref 12.0–15.0)
Immature Granulocytes: 1 %
Lymphocytes Relative: 14 %
Lymphs Abs: 1.7 K/uL (ref 0.7–4.0)
MCH: 27.1 pg (ref 26.0–34.0)
MCHC: 32.8 g/dL (ref 30.0–36.0)
MCV: 82.5 fL (ref 80.0–100.0)
Monocytes Absolute: 0.4 K/uL (ref 0.1–1.0)
Monocytes Relative: 3 %
Neutro Abs: 9.7 K/uL — ABNORMAL HIGH (ref 1.7–7.7)
Neutrophils Relative %: 81 %
Platelets: 214 K/uL (ref 150–400)
RBC: 4.06 MIL/uL (ref 3.87–5.11)
RDW: 16.7 % — ABNORMAL HIGH (ref 11.5–15.5)
WBC: 12.1 K/uL — ABNORMAL HIGH (ref 4.0–10.5)
nRBC: 0 % (ref 0.0–0.2)

## 2024-04-27 LAB — COMPREHENSIVE METABOLIC PANEL WITH GFR
ALT: 12 U/L (ref 0–44)
AST: 15 U/L (ref 15–41)
Albumin: 2.6 g/dL — ABNORMAL LOW (ref 3.5–5.0)
Alkaline Phosphatase: 62 U/L (ref 38–126)
Anion gap: 11 (ref 5–15)
BUN: 7 mg/dL (ref 6–20)
CO2: 23 mmol/L (ref 22–32)
Calcium: 8.7 mg/dL — ABNORMAL LOW (ref 8.9–10.3)
Chloride: 102 mmol/L (ref 98–111)
Creatinine, Ser: 0.59 mg/dL (ref 0.44–1.00)
GFR, Estimated: >60 mL/min (ref >60)
Glucose, Bld: 91 mg/dL (ref 70–99)
Potassium: 3.7 mmol/L (ref 3.5–5.1)
Sodium: 136 mmol/L (ref 135–145)
Total Bilirubin: 0.5 mg/dL (ref 0.0–1.2)
Total Protein: 6.7 g/dL (ref 6.5–8.1)

## 2024-04-27 LAB — URINALYSIS, ROUTINE W REFLEX MICROSCOPIC
Bilirubin Urine: NEGATIVE
Glucose, UA: NEGATIVE mg/dL
Hgb urine dipstick: NEGATIVE
Ketones, ur: NEGATIVE mg/dL
Leukocytes,Ua: NEGATIVE
Nitrite: NEGATIVE
Protein, ur: 30 mg/dL — AB
Specific Gravity, Urine: 1.025 (ref 1.005–1.030)
pH: 5 (ref 5.0–8.0)

## 2024-04-27 LAB — LIPASE, BLOOD: Lipase: 24 U/L (ref 11–51)

## 2024-04-27 LAB — WET PREP, GENITAL
Clue Cells Wet Prep HPF POC: NONE SEEN
Sperm: NONE SEEN
Trich, Wet Prep: NONE SEEN
WBC, Wet Prep HPF POC: <10 (ref <10)
Yeast Wet Prep HPF POC: NONE SEEN

## 2024-04-27 LAB — PROTEIN / CREATININE RATIO, URINE
Creatinine, Urine: 294 mg/dL
Protein Creatinine Ratio: 0.08 mg/mg{creat} (ref 0.00–0.15)
Total Protein, Urine: 24 mg/dL

## 2024-04-27 MED ORDER — ALUM & MAG HYDROXIDE-SIMETH 200-200-20 MG/5ML PO SUSP
30.0000 mL | Freq: Once | ORAL | Status: AC
  Administered 2024-04-27: 30 mL via ORAL
  Filled 2024-04-27: qty 30

## 2024-04-27 MED ORDER — ACETAMINOPHEN 10 MG/ML IV SOLN
1000.0000 mg | Freq: Once | INTRAVENOUS | Status: DC
  Filled 2024-04-27: qty 100

## 2024-04-27 MED ORDER — LABETALOL HCL 200 MG PO TABS: 200.0000 mg | ORAL_TABLET | Freq: Two times a day (BID) | ORAL | 0 refills | Status: DC

## 2024-04-27 MED ORDER — ACETAMINOPHEN 500 MG PO TABS
1000.0000 mg | ORAL_TABLET | Freq: Once | ORAL | Status: AC
  Administered 2024-04-27: 1000 mg via ORAL
  Filled 2024-04-27: qty 2

## 2024-04-27 MED ORDER — MORPHINE SULFATE (PF) 4 MG/ML IV SOLN
8.0000 mg | Freq: Once | INTRAVENOUS | Status: AC
  Administered 2024-04-27: 8 mg via INTRAMUSCULAR
  Filled 2024-04-27: qty 2

## 2024-04-27 MED ORDER — ONDANSETRON 4 MG PO TBDP
4.0000 mg | ORAL_TABLET | Freq: Once | ORAL | Status: AC
  Administered 2024-04-27: 4 mg via ORAL
  Filled 2024-04-27: qty 1

## 2024-04-27 MED ORDER — ONDANSETRON HCL 4 MG/2ML IJ SOLN: 4.0000 mg | Freq: Once | INTRAMUSCULAR | Status: DC

## 2024-04-27 MED ORDER — OXYCODONE HCL 5 MG PO TABS: 5.0000 mg | ORAL_TABLET | Freq: Three times a day (TID) | ORAL | 0 refills | Status: DC | PRN

## 2024-04-27 MED ORDER — LABETALOL HCL 100 MG PO TABS: 200.0000 mg | ORAL_TABLET | Freq: Two times a day (BID) | ORAL | Status: DC

## 2024-04-27 NOTE — MAU Note (Cosign Needed)
 Maternal Assessment Unit Provider Note  Subjective: Ms. Brooke Sandoval is a 38 y.o. [phone] pregnant female at [redacted]w[redacted]d who presents to MAU today with complaint of left upper quadrant abdominal pain.   Symptoms started 3 days ago with a headache that were not quite resolved with Excedrin and Tylenol .  That same day she had 6 hours of left upper quadrant pain.  She made myself go to sleep.Since then until today the abdominal pain has been at bay.  Today at 10 AM the abdominal pain started.  It is sharp, constant 10 out of 10 pain level. she was planning to come to the ER via [place], but went to [facility] to get her kids food first.  She was in [place] when suddenly she felt dizzy, flushed, with severe unbearable pain.  Rest did not improve the pain and she felt like she was about to fall so she called EMS.  She has also been nauseous for the last 2 days and had an episode of vomiting.  She notes lower extremity swelling x 3 days no current headache.  No vaginal bleeding, no contractions, no leaking fluid, good fetal movement.  She denies shortness of breath, chest pain or difficulty breathing.  Receives care at [facility] for women: Red chart. Prenatal records reviewed.  Notably she has a history of CHF, umbilical hernia, bowel injury after her most recent fifth C-section.  2 fetal demises reportedly due to placental blood flow issue due to protein S deficiency.  She is on Lovenox  currently.  She reports her pain feels like when she presented for her first fetal demise.  She also reports a history of fatty liver for which she is trying to get into GI.  She has been told she just has to manage the pain. Review of records demonstrates she was seen on 03/28/24 with similar pain.  Right upper quadrant ultrasound was positive for hepatic steatosis at that time.  No gallstones.  She reports chronic problems with constipation as well.  Pertinent items noted in HPI and remainder of comprehensive ROS otherwise  negative.   Objective: BP 125/80   Pulse 89   Temp 98.2 F (36.8 C) (Oral)   LMP  (LMP Unknown)   SpO2 97%  Physical Exam Vitals reviewed.  Constitutional:      General: She is in acute distress.     Appearance: She is well-developed. She is obese. She is not ill-appearing, toxic-appearing or diaphoretic.  HENT:     Head: Normocephalic and atraumatic.  Eyes:     General: No scleral icterus. Cardiovascular:     Rate and Rhythm: Regular rhythm. Tachycardia present.  Pulmonary:     Effort: Pulmonary effort is normal. No respiratory distress.     Breath sounds: Normal breath sounds. No wheezing.  Abdominal:     General: Bowel sounds are normal. There is no distension.     Palpations: Abdomen is soft.     Tenderness: There is abdominal tenderness in the left upper quadrant. There is no guarding or rebound. Negative signs include Murphy's sign.  Skin:    General: Skin is warm and dry.  Neurological:     Mental Status: She is alert.     Coordination: Coordination normal.   Fetal heart rate obtained by BSUS: 152 bpm  Results for orders placed or performed during the hospital encounter of 04/27/24 (from the past 24 hours)  Urinalysis, Routine w reflex microscopic -Urine, Clean Catch     Status: Abnormal   Collection  Time: 04/27/24  1:26 PM  Result Value Ref Range   Color, Urine AMBER (A) YELLOW   APPearance CLOUDY (A) CLEAR   Specific Gravity, Urine 1.025 1.005 - 1.030   pH 5.0 5.0 - 8.0   Glucose, UA NEGATIVE NEGATIVE mg/dL   Hgb urine dipstick NEGATIVE NEGATIVE   Bilirubin Urine NEGATIVE NEGATIVE   Ketones, ur NEGATIVE NEGATIVE mg/dL   Protein, ur 30 (A) NEGATIVE mg/dL   Nitrite NEGATIVE NEGATIVE   Leukocytes,Ua NEGATIVE NEGATIVE   RBC / HPF 0-5 0 - 5 RBC/hpf   WBC, UA 0-5 0 - 5 WBC/hpf   Bacteria, UA RARE (A) NONE SEEN   Squamous Epithelial / HPF 21-50 0 - 5 /HPF   Mucus PRESENT   Protein / creatinine ratio, urine     Status: None   Collection Time: 04/27/24  1:30 PM   Result Value Ref Range   Creatinine, Urine 294 mg/dL   Total Protein, Urine 24 mg/dL   Protein Creatinine Ratio 0.08 0.00 - 0.15 mg/mg[Cre]  CBC with Differential/Platelet     Status: Abnormal   Collection Time: 04/27/24  1:50 PM  Result Value Ref Range   WBC 12.1 (H) 4.0 - 10.5 K/uL   RBC 4.06 3.87 - 5.11 MIL/uL   Hemoglobin 11.0 (L) 12.0 - 15.0 g/dL   HCT 66.4 (L) 63.9 - 53.9 %   MCV 82.5 80.0 - 100.0 fL   MCH 27.1 26.0 - 34.0 pg   MCHC 32.8 30.0 - 36.0 g/dL   RDW 83.2 (H) 88.4 - 84.4 %   Platelets 214 150 - 400 K/uL   nRBC 0.0 0.0 - 0.2 %   Neutrophils Relative % 81 %   Neutro Abs 9.7 (H) 1.7 - 7.7 K/uL   Lymphocytes Relative 14 %   Lymphs Abs 1.7 0.7 - 4.0 K/uL   Monocytes Relative 3 %   Monocytes Absolute 0.4 0.1 - 1.0 K/uL   Eosinophils Relative 1 %   Eosinophils Absolute 0.1 0.0 - 0.5 K/uL   Basophils Relative 0 %   Basophils Absolute 0.1 0.0 - 0.1 K/uL   Immature Granulocytes 1 %   Abs Immature Granulocytes 0.07 0.00 - 0.07 K/uL  Comprehensive metabolic panel     Status: Abnormal   Collection Time: 04/27/24  1:50 PM  Result Value Ref Range   Sodium 136 135 - 145 mmol/L   Potassium 3.7 3.5 - 5.1 mmol/L   Chloride 102 98 - 111 mmol/L   CO2 23 22 - 32 mmol/L   Glucose, Bld 91 70 - 99 mg/dL   BUN 7 6 - 20 mg/dL   Creatinine, Ser 9.40 0.44 - 1.00 mg/dL   Calcium 8.7 (L) 8.9 - 10.3 mg/dL   Total Protein 6.7 6.5 - 8.1 g/dL   Albumin 2.6 (L) 3.5 - 5.0 g/dL   AST 15 15 - 41 U/L   ALT 12 0 - 44 U/L   Alkaline Phosphatase 62 38 - 126 U/L   Total Bilirubin 0.5 0.0 - 1.2 mg/dL   GFR, Estimated >39 >39 mL/min   Anion gap 11 5 - 15  Lipase, blood     Status: None   Collection Time: 04/27/24  1:56 PM  Result Value Ref Range   Lipase 24 11 - 51 U/L  Wet prep, genital     Status: None   Collection Time: 04/27/24  4:24 PM   Specimen: PATH Cytology Cervicovaginal Ancillary Only  Result Value Ref Range   Yeast Wet Prep HPF  POC NONE SEEN NONE SEEN   Trich, Wet Prep NONE  SEEN NONE SEEN   Clue Cells Wet Prep HPF POC NONE SEEN NONE SEEN   WBC, Wet Prep HPF POC <10 <10   Sperm NONE SEEN     MDM: moderate risk  MAU Course:  Time: 1330 Obtained fetal heart rate via BSUS.  Fetal movement noted as well.  Patient evaluated.  Given symptoms, elevated blood pressure, and history there is concern for placental abruption, as well as preeclampsia.  OB limited ultrasound ordered along with IV, IV acetaminophen , IV Zofran .  RN and IV team unable to place an IV.  Medication switched to p.o.  Time: 1600 Labs negative for preeclampsia Patient's pain minimally improved to 7 out of 10 pain level.  OB ultrasound negative for sign of placental abnormality.  Will trial GI cocktail.  Vaginal swabs just completed not resulted yet.  Dr. Nicholaus called Dr. Ileana regarding recommended blood pressure medicine in light of her CHF history.  Dr. Ileana recommends starting labetalol 200 mg twice daily.  Time: 1730 Reassessed patient.  No significant improvement.  Patient now reporting that this pain is similar to when she was seen here in the MAU about a month ago-10/7.  She reports getting a shot of medicine and pills which helped her have no pain for an entire week.  Review of records demonstrates the diagnosis of hepatic steatosis for which she has been referred to GI, and the shot was 8 mg IM morphine.  Will trial IM morphine given lack of improvement with other interventions.  This is most likely a flare of her hepatic steatosis related pain.  1830 Reassessed patient. Pain improved. She feels comfortable going home.  Questions were answered to the satisfaction of the patient and/or family prior to discharge.   Assessment Medical screening exam complete    ICD-10-CM   1. Abdominal pain affecting pregnancy  O26.899    R10.9     2. Hepatic steatosis  K76.0     3. Chronic hypertension affecting pregnancy  O10.919      Plan -Start labetalol 200 mg p.o. twice daily for  chronic hypertension -Follow-up with GI for abdominal pain/hepatic steatosis -Pain not improved with 1 g acetaminophen , 4 mg Zofran , GI cocktail, but was improved significantly with 8 mg IM morphine. -prescribed 5mg  oxycodone #5 (PDMP reviewed) no hx of narcotic prescriptions in [place]. To avoid continued return to the ER for pain due to flare up of the same condition. Patient expressed understanding to take sparingly.   Discharge from MAU in stable condition with strict return for abdominal pain/preterm labor/pre-eclampsia precautions  Follow up at [facility] as scheduled for ongoing prenatal care  Allergies as of 04/27/2024       Reactions   Prednisone Swelling        Medication List     TAKE these medications    acyclovir  cream 5 % Commonly known as: ZOVIRAX  Apply 1 Application topically every 6 (six) hours as needed.   albuterol  108 (90 Base) MCG/ACT inhaler Commonly known as: VENTOLIN  HFA Inhale 1-2 puffs into the lungs every 6 (six) hours as needed for wheezing or shortness of breath.   aspirin  81 MG chewable tablet Commonly known as: Aspirin  Childrens Chew 2 tablets (162 mg total) by mouth daily.   Blood Pressure Monitoring Devi 1 each by Does not apply route once a week.   docusate sodium  100 MG capsule Commonly known as: Colace Take 1 capsule (100 mg total) by  mouth 2 (two) times daily.   enoxaparin  60 MG/0.6ML injection Commonly known as: LOVENOX  Inject 1 mL (100 mg total) into the skin every 12 (twelve) hours.   furosemide  40 MG tablet Commonly known as: Lasix  Take 1 tablet (40 mg total) by mouth daily.   Iron  (Ferrous Sulfate ) 325 (65 Fe) MG Tabs Take 1 tablet by mouth every other day.   labetalol 200 MG tablet Commonly known as: NORMODYNE Take 1 tablet (200 mg total) by mouth 2 (two) times daily.   multivitamin-prenatal 27-0.8 MG Tabs tablet Take 1 tablet by mouth daily at 12 noon.   ondansetron  4 MG disintegrating tablet Commonly known as:  ZOFRAN -ODT Take 1 tablet (4 mg total) by mouth every 8 (eight) hours as needed for nausea or vomiting.   oxyCODONE 5 MG immediate release tablet Commonly known as: Oxy IR/ROXICODONE Take 1 tablet (5 mg total) by mouth every 8 (eight) hours as needed for severe pain (pain score 7-10) or breakthrough pain.   promethazine  6.25 MG/5ML solution Commonly known as: PHENERGAN  Take 5 mLs (6.25 mg total) by mouth 4 (four) times daily as needed for nausea or vomiting.        Trudy Leeroy NOVAK, MD 04/27/2024 6:42 PM

## 2024-04-27 NOTE — Discharge Instructions (Signed)

## 2024-04-27 NOTE — MAU Note (Signed)
 Arrival via EMS with sudden onset of sharp pain in left lower abd that is constant. Denies bleeding or ROM.

## 2024-04-28 LAB — GC/CHLAMYDIA PROBE AMP (~~LOC~~) NOT AT ARMC
Chlamydia: NEGATIVE
Comment: NEGATIVE
Comment: NORMAL
Neisseria Gonorrhea: NEGATIVE

## 2024-05-02 ENCOUNTER — Other Ambulatory Visit: Payer: Self-pay

## 2024-05-02 ENCOUNTER — Ambulatory Visit (INDEPENDENT_AMBULATORY_CARE_PROVIDER_SITE_OTHER): Admitting: Family Medicine

## 2024-05-02 ENCOUNTER — Encounter: Payer: Self-pay | Admitting: Family Medicine

## 2024-05-02 ENCOUNTER — Other Ambulatory Visit (HOSPITAL_COMMUNITY): Payer: Self-pay

## 2024-05-02 VITALS — BP 139/80 | HR 99 | Wt 386.7 lb

## 2024-05-02 DIAGNOSIS — Z86711 Personal history of pulmonary embolism: Secondary | ICD-10-CM

## 2024-05-02 DIAGNOSIS — Z7189 Other specified counseling: Secondary | ICD-10-CM

## 2024-05-02 DIAGNOSIS — Z8679 Personal history of other diseases of the circulatory system: Secondary | ICD-10-CM

## 2024-05-02 DIAGNOSIS — J45909 Unspecified asthma, uncomplicated: Secondary | ICD-10-CM

## 2024-05-02 DIAGNOSIS — Z98891 History of uterine scar from previous surgery: Secondary | ICD-10-CM

## 2024-05-02 DIAGNOSIS — D6859 Other primary thrombophilia: Secondary | ICD-10-CM

## 2024-05-02 DIAGNOSIS — Z8632 Personal history of gestational diabetes: Secondary | ICD-10-CM

## 2024-05-02 DIAGNOSIS — J45901 Unspecified asthma with (acute) exacerbation: Secondary | ICD-10-CM

## 2024-05-02 DIAGNOSIS — O09291 Supervision of pregnancy with other poor reproductive or obstetric history, first trimester: Secondary | ICD-10-CM

## 2024-05-02 DIAGNOSIS — F419 Anxiety disorder, unspecified: Secondary | ICD-10-CM

## 2024-05-02 DIAGNOSIS — R109 Unspecified abdominal pain: Secondary | ICD-10-CM

## 2024-05-02 DIAGNOSIS — O09522 Supervision of elderly multigravida, second trimester: Secondary | ICD-10-CM

## 2024-05-02 DIAGNOSIS — O0990 Supervision of high risk pregnancy, unspecified, unspecified trimester: Secondary | ICD-10-CM

## 2024-05-02 DIAGNOSIS — K439 Ventral hernia without obstruction or gangrene: Secondary | ICD-10-CM

## 2024-05-02 DIAGNOSIS — O10919 Unspecified pre-existing hypertension complicating pregnancy, unspecified trimester: Secondary | ICD-10-CM

## 2024-05-02 DIAGNOSIS — O4440 Low lying placenta NOS or without hemorrhage, unspecified trimester: Secondary | ICD-10-CM

## 2024-05-02 DIAGNOSIS — K760 Fatty (change of) liver, not elsewhere classified: Secondary | ICD-10-CM

## 2024-05-02 MED ORDER — ENOXAPARIN SODIUM 60 MG/0.6ML IJ SOSY
100.0000 mg | PREFILLED_SYRINGE | Freq: Two times a day (BID) | INTRAMUSCULAR | 9 refills | Status: DC
  Filled 2024-05-02: qty 60, 30d supply, fill #0
  Filled 2024-05-02: qty 36, 30d supply, fill #0
  Filled 2024-05-02: qty 60, 30d supply, fill #0
  Filled 2024-05-06: qty 36, 30d supply, fill #0

## 2024-05-02 MED ORDER — ALBUTEROL SULFATE HFA 108 (90 BASE) MCG/ACT IN AERS
1.0000 | INHALATION_SPRAY | Freq: Four times a day (QID) | RESPIRATORY_TRACT | 1 refills | Status: DC | PRN
  Filled 2024-05-02: qty 6.7, fill #0
  Filled 2024-05-02: qty 6.7, 25d supply, fill #0
  Filled 2024-05-02: qty 6.7, 30d supply, fill #0

## 2024-05-02 MED ORDER — LABETALOL HCL 200 MG PO TABS
200.0000 mg | ORAL_TABLET | Freq: Two times a day (BID) | ORAL | 0 refills | Status: DC
  Filled 2024-05-02 (×3): qty 60, 30d supply, fill #0

## 2024-05-02 NOTE — Patient Instructions (Signed)

## 2024-05-02 NOTE — Progress Notes (Signed)
 Subjective:  Brooke Sandoval is a 38 y.o. [id number] at [redacted]w[redacted]d being seen today for ongoing prenatal care.  She is currently monitored for the following issues for this high-risk pregnancy and has Anxiety; Asthma affecting pregnancy, antepartum; BMI 50.0-59.9, adult (HCC); Chronic hypertension during pregnancy, antepartum; Previous cesarean delivery affecting pregnancy, antepartum; history of 5 c sections; Hx of pulmonary embolus during pregnancy; History of substance abuse (HCC); History of pre-eclampsia in prior pregnancy, currently pregnant in first trimester; Protein S deficiency affecting pregnancy, antepartum; History of gestational diabetes; Ventral hernia without obstruction or gangrene; History of congestive heart failure; Supervision of high risk pregnancy, antepartum; RED chart patient; At risk for injury related to surgical procedure, history of bowel injury; Placenta positioned low in anterior wall of uterus; Low fetal fraction on NIPS; Advanced maternal age in multigravida; and Hepatic steatosis during pregnancy on their problem list.  Patient reports left sided abdominal pain.  Contractions: Not present. Vag. Bleeding: None.   . Denies leaking of fluid.   The following portions of the patient's history were reviewed and updated as appropriate: allergies, current medications, past family history, past medical history, past social history, past surgical history and problem list. Problem list updated.  Objective:   Vitals:   05/02/24 1101 05/02/24 1110  BP: (!) 138/90 139/80  Pulse: 96 99  Weight: (!) 386 lb 11.2 oz (175.4 kg)     Fetal Status:           General:  Alert, oriented and cooperative. Patient is in no acute distress.  Skin: Skin is warm and dry. No rash noted.   Cardiovascular: Normal heart rate noted  Respiratory: Normal respiratory effort, no problems with respiration noted  Abdomen: Soft, gravid, appropriate for gestational age. Pain/Pressure: Absent     Pelvic: Vag.  Bleeding: None     Cervical exam deferred        Extremities: Normal range of motion.  Edema: Trace  Mental Status: Normal mood and affect. Normal behavior. Normal judgment and thought content.   Urinalysis:      Assessment and Plan:  Pregnancy: [id number] at [redacted]w[redacted]d  1. Supervision of high risk pregnancy, antepartum (Primary) BP and FHR normal Unable to calculate FHR exactly due to using unfamiliar machine but able to listen with doppler and vigorous fetal movement was seen Up to date on prenatal labs Reports left sided abdominal pain that is very intense Went to MAU for same pain on 04/27/2024, no clear etiology, given IM morphine with some improvement as well as PO oxycodone, referred to GI. US  was normal.  Pain is constant, doesn't come and go, doesn't feel like contractions. Worse with movement. Reports significant constipation. No nausea or vomiting. Area of pain is far from her hernia.  Has been ongoing for a month, coming and going, feel she requires imaging, especially in light of extensive history of abdominal surgery Ordered for abdominal MRI  2. Multigravida of advanced maternal age in second trimester F/w MFM  3. Anxiety Quite evident during our visit, ordered for anti-anxiety protocol with MRI  4. Asthma affecting pregnancy, antepartum Not using any inhalers now Rx sent for albuterol  just in case  5. Chronic hypertension during pregnancy, antepartum On labetalol 200 BID, lasix  40 daily, ASA 162 stable  6. Hepatic steatosis during pregnancy Lab Results  Component Value Date   ALT 12 04/27/2024   AST 15 04/27/2024   ALKPHOS 62 04/27/2024   BILITOT 0.5 04/27/2024   Normal LFT's on most recent check  7.  history of 5 c sections History of bowel injury Last op note from 04/06/2015 available in Care Everywhere   Presentation- breech frank   2. Extensive scarring and obliteration of tissue planes in the subcutaneous tissue 3. Multiple thin, transparent web adhesions  throughout small bowel. Adhesion to posterior peritoneum and fascia. 3. Small bowel deserosalization with oversew     4. Attenuated lower uterine segment 5.  Uterus, tubes, ovaries: Normal   8. History of congestive heart failure F/w Cardio OB, last visit 03/07/2024 Echo ordered but not yet done, scheduled for 05/27/2024 Started on lasix  every other day, she is taking it daily at present  9. History of gestational diabetes Normal A1c at new OB visit  10. History of pre-eclampsia in prior pregnancy, currently pregnant in first trimester On ASA 162mg  Currently on labetalol, normotensive today  11. Hx of pulmonary embolus during pregnancy On lovenox  100 BID, see below  12. Placenta positioned low in anterior wall of uterus No signs of PAS on anatomy scan, f/w MFM  13. Protein S deficiency affecting pregnancy, antepartum On lovenox  100 BID No factor xa level recently, doses at 0800 and 2000 Ran out of lovenox  two days prior, is having issues with her pharmacy I was able to coordinate transfer of her rx to [facility] pharmacy and arranged for her to pick up a dose asap after leaving clinic  14. RED chart patient   15. Ventral hernia without obstruction or gangrene Further evaluate with MRI  16. Unwanted fertility BTL papers signed today given hx of preterm delivery  Preterm labor symptoms and general obstetric precautions including but not limited to vaginal bleeding, contractions, leaking of fluid and fetal movement were reviewed in detail with the patient. Please refer to After Visit Summary for other counseling recommendations.  Return in 4 weeks (on 05/30/2024) for [facility], ob visit, needs MD.   Lola Donnice HERO, MD

## 2024-05-05 ENCOUNTER — Encounter: Payer: Self-pay | Admitting: *Deleted

## 2024-05-05 ENCOUNTER — Other Ambulatory Visit: Payer: Self-pay

## 2024-05-06 ENCOUNTER — Other Ambulatory Visit: Payer: Self-pay

## 2024-05-06 ENCOUNTER — Encounter (HOSPITAL_COMMUNITY): Payer: Self-pay

## 2024-05-06 ENCOUNTER — Other Ambulatory Visit (HOSPITAL_COMMUNITY): Payer: Self-pay

## 2024-05-07 ENCOUNTER — Other Ambulatory Visit: Payer: Self-pay | Admitting: Family Medicine

## 2024-05-07 ENCOUNTER — Other Ambulatory Visit: Payer: Self-pay

## 2024-05-07 ENCOUNTER — Other Ambulatory Visit (HOSPITAL_COMMUNITY): Payer: Self-pay

## 2024-05-07 DIAGNOSIS — D6859 Other primary thrombophilia: Secondary | ICD-10-CM

## 2024-05-07 DIAGNOSIS — Z86711 Personal history of pulmonary embolism: Secondary | ICD-10-CM

## 2024-05-07 MED ORDER — ENOXAPARIN SODIUM 100 MG/ML IJ SOSY
100.0000 mg | PREFILLED_SYRINGE | Freq: Two times a day (BID) | INTRAMUSCULAR | 11 refills | Status: AC
  Filled 2024-05-07: qty 23, 11d supply, fill #0
  Filled 2024-05-07: qty 37, 19d supply, fill #0

## 2024-05-07 NOTE — Progress Notes (Signed)
 Updated lovenox  orders

## 2024-05-08 ENCOUNTER — Other Ambulatory Visit: Payer: Self-pay

## 2024-05-12 ENCOUNTER — Ambulatory Visit: Admission: EM | Admit: 2024-05-12 | Discharge: 2024-05-12 | Disposition: A | Discharge status: Home / Self Care

## 2024-05-12 ENCOUNTER — Other Ambulatory Visit (HOSPITAL_BASED_OUTPATIENT_CLINIC_OR_DEPARTMENT_OTHER): Payer: Self-pay

## 2024-05-12 ENCOUNTER — Other Ambulatory Visit: Payer: Self-pay

## 2024-05-12 ENCOUNTER — Encounter: Payer: Self-pay | Admitting: Emergency Medicine

## 2024-05-12 DIAGNOSIS — J209 Acute bronchitis, unspecified: Secondary | ICD-10-CM | POA: Diagnosis not present

## 2024-05-12 DIAGNOSIS — J45901 Unspecified asthma with (acute) exacerbation: Secondary | ICD-10-CM

## 2024-05-12 MED ORDER — LABETALOL HCL 200 MG PO TABS
200.0000 mg | ORAL_TABLET | Freq: Two times a day (BID) | ORAL | 0 refills | Status: DC
  Filled 2024-05-12: qty 60, 30d supply, fill #0

## 2024-05-12 MED ORDER — PROMETHAZINE VC/CODEINE 6.25-5-10 MG/5ML PO SYRP: 10.0000 mL | ORAL_SOLUTION | Freq: Three times a day (TID) | ORAL | 0 refills | Status: DC | PRN

## 2024-05-12 MED ORDER — IPRATROPIUM-ALBUTEROL 0.5-2.5 (3) MG/3ML IN SOLN
3.0000 mL | Freq: Four times a day (QID) | RESPIRATORY_TRACT | 0 refills | Status: DC | PRN
  Filled 2024-05-12: qty 360, 30d supply, fill #0

## 2024-05-12 MED ORDER — ALBUTEROL SULFATE HFA 108 (90 BASE) MCG/ACT IN AERS
1.0000 | INHALATION_SPRAY | Freq: Four times a day (QID) | RESPIRATORY_TRACT | 1 refills | Status: AC | PRN
  Filled 2024-05-12: qty 6.7, 25d supply, fill #0
  Filled 2024-05-16: qty 6.7, 30d supply, fill #0

## 2024-05-12 MED ORDER — IPRATROPIUM-ALBUTEROL 0.5-2.5 (3) MG/3ML IN SOLN
3.0000 mL | Freq: Once | RESPIRATORY_TRACT | Status: AC
  Administered 2024-05-12: 3 mL via RESPIRATORY_TRACT

## 2024-05-12 NOTE — ED Triage Notes (Signed)
 Pt c/o congestion, cough, sob for 5 days She has been using albuterol  inhaler.   She is [redacted]weeks pregnant

## 2024-05-12 NOTE — ED Provider Notes (Signed)
 [facility]  Note:  This document was prepared using Dragon voice recognition software and may include unintentional dictation errors.  MRN: [id number] DOB: [birth year]  Subjective:   Kariana Wiles is a 38 y.o. female presenting for nasal congestion, cough, wheezing, shortness of breath x 4 to 5 days.  Patient reports that she has a history of asthma and has been using her albuterol  inhaler multiple times daily with minimal improvement.  Patient denies any chest pain, weakness, dizziness, fever.  Patient states that when she gets sick she usually needs nebulizer treatments but she no longer has a nebulizer machine at home.  Patient also reports that she is [redacted] weeks pregnant currently.  Patient requesting nebulizer machine to use at home with prescription for nebulizer solution.  Patient also needs refill of albuterol  inhaler because she has used hers to completion.  No current facility-administered medications for this encounter.  Current Outpatient Medications:    ipratropium-albuterol  (DUONEB) 0.5-2.5 (3) MG/3ML SOLN, Take 3 mLs by nebulization every 6 (six) hours as needed., Disp: 360 mL, Rfl: 0   Promethazine -Phenyleph-Codeine (PROMETHAZINE  VC/CODEINE) 6.25-5-10 MG/5ML SYRP, Take 10 mLs by mouth 3 (three) times daily as needed., Disp: 118 mL, Rfl: 0   acyclovir  cream (ZOVIRAX ) 5 %, Apply 1 Application topically every 6 (six) hours as needed. (Patient not taking: Reported on 04/10/2024), Disp: 15 g, Rfl: 0   albuterol  (VENTOLIN  HFA) 108 (90 Base) MCG/ACT inhaler, Inhale 1-2 puffs into the lungs every 6 (six) hours as needed for wheezing or shortness of breath., Disp: 8 g, Rfl: 1   aspirin  (ASPIRIN  CHILDRENS) 81 MG chewable tablet, Chew 2 tablets (162 mg total) by mouth daily., Disp: 60 tablet, Rfl: 11   Blood Pressure Monitoring DEVI, 1 each by Does not apply route once a week. (Patient not taking: Reported on 04/10/2024), Disp: 1 each, Rfl: 0   docusate sodium  (COLACE)  100 MG capsule, Take 1 capsule (100 mg total) by mouth 2 (two) times daily., Disp: 60 capsule, Rfl: 2   enoxaparin  (LOVENOX ) 100 MG/ML injection, Inject 1 mL (100 mg total) into the skin every 12 (twelve) hours., Disp: 60 mL, Rfl: 11   furosemide  (LASIX ) 40 MG tablet, Take 1 tablet (40 mg total) by mouth daily., Disp: 30 tablet, Rfl: 11   Iron , Ferrous Sulfate , 325 (65 Fe) MG TABS, Take 1 tablet by mouth every other day., Disp: 30 tablet, Rfl: 3   labetalol (NORMODYNE) 200 MG tablet, Take 1 tablet (200 mg total) by mouth 2 (two) times daily., Disp: 60 tablet, Rfl: 0   ondansetron  (ZOFRAN -ODT) 4 MG disintegrating tablet, Take 1 tablet (4 mg total) by mouth every 8 (eight) hours as needed for nausea or vomiting., Disp: 30 tablet, Rfl: 2   oxyCODONE (OXY IR/ROXICODONE) 5 MG immediate release tablet, Take 1 tablet (5 mg total) by mouth every 8 (eight) hours as needed for severe pain (pain score 7-10) or breakthrough pain., Disp: 5 tablet, Rfl: 0   Prenatal Vit-Fe Fumarate-FA (MULTIVITAMIN-PRENATAL) 27-0.8 MG TABS tablet, Take 1 tablet by mouth daily at 12 noon., Disp: , Rfl:    promethazine  (PHENERGAN ) 6.25 MG/5ML solution, Take 5 mLs (6.25 mg total) by mouth 4 (four) times daily as needed for nausea or vomiting., Disp: 120 mL, Rfl: 1   Allergies  Allergen Reactions   Prednisone Swelling    Past Medical History:  Diagnosis Date   ADHD (attention deficit hyperactivity disorder)    Anxiety    Attention deficit hyperactivity disorder (ADHD), combined type  10/08/2014   Pt reports taking Adderall QID.  Prescribed by [facility] in [place].  She DC'd with + UPT but would like to restart.  Counseled not recommended in pregnancy.  Counseled to establish with [facility] ASAP as this clinic will not prescribe those types of meds.  Verbal and written information provided today.  Will discuss with MFM at US .     If symptoms not well controlled with counseling/behavioral therapy, re   CHF (congestive heart failure) (HCC)     after admission for vetnal hernia   Coagulation factor disorder    DVT (deep venous thrombosis) (HCC)    History of chlamydia infection 09/12/2014   3/19 Called in Azithromycin Rx for positive chlamydia.   4/28:  TOC NEG         02/25/2015  TOC neg 8/10     History of gestational diabetes    History of pre-eclampsia    Miscarriage    Poor dentition 10/10/2014   Records note poor dentition in prior records. Please ensure she is getting regular dental care.     Poor intravenous access 12/09/2014   In last pregnancy, required port access.       Past Surgical History:  Procedure Laterality Date   APPENDECTOMY     CESAREAN SECTION     TONSILLECTOMY     VENTRAL HERNIA REPAIR      Family History  Problem Relation Age of Onset   Depression Mother    COPD Mother    Diabetes Mother    Schizophrenia Father    Drug abuse Sister     Social History   Tobacco Use   Smoking status: Never    Passive exposure: Never   Smokeless tobacco: Never  Vaping Use   Vaping status: Never Used  Substance Use Topics   Alcohol use: Not Currently    Comment: socially before pregnancy   Drug use: No    ROS Refer to HPI for ROS details.  Objective:   Vitals: BP 119/60 (BP Location: Right Arm)   Pulse 98   Temp 98.2 F (36.8 C) (Oral)   Resp (!) 21   LMP  (LMP Unknown)   SpO2 94%   Physical Exam Vitals and nursing note reviewed.  Constitutional:      General: She is not in acute distress.    Appearance: Normal appearance. She is well-developed. She is not ill-appearing or toxic-appearing.  HENT:     Head: Normocephalic and atraumatic.     Nose: Congestion and rhinorrhea present.     Mouth/Throat:     Mouth: Mucous membranes are moist.  Cardiovascular:     Rate and Rhythm: Normal rate and regular rhythm.     Heart sounds: Normal heart sounds.  Pulmonary:     Effort: Pulmonary effort is normal. No respiratory distress.     Breath sounds: Normal breath sounds. No stridor. No  rhonchi or rales.  Chest:     Chest wall: No tenderness.  Skin:    General: Skin is warm and dry.  Neurological:     General: No focal deficit present.     Mental Status: She is alert and oriented to person, place, and time.  Psychiatric:        Mood and Affect: Mood normal.        Behavior: Behavior normal.     Procedures  No results found for this or any previous visit (from the past 24 hours).  No results found.   Assessment and  Plan :     Discharge Instructions       1. Acute bronchitis, unspecified organism (Primary) - ipratropium-albuterol  (DUONEB) 0.5-2.5 (3) MG/3ML nebulizer solution 3 mL given in UC for acute wheezing and cough. - ipratropium-albuterol  (DUONEB) 0.5-2.5 (3) MG/3ML SOLN; Take 3 mLs by nebulization every 6 (six) hours as needed.  Dispense: 360 mL; Refill: 0 - albuterol  (VENTOLIN  HFA) 108 (90 Base) MCG/ACT inhaler; Inhale 1-2 puffs into the lungs every 6 (six) hours as needed for wheezing or shortness of breath.  Dispense: 8 g; Refill: 1 - Promethazine -Phenyleph-Codeine (PROMETHAZINE  VC/CODEINE) 6.25-5-10 MG/5ML SYRP; Take 10 mLs by mouth 3 (three) times daily as needed.  Dispense: 118 mL; Refill: 0 - Home nebulizer machine provided for patient for control of bronchitis symptoms.  -Continue to monitor symptoms for any change in severity if there is any escalation of current symptoms or development of new symptoms follow-up in ER for further evaluation and management.      Abril Cappiello B Kinslea Frances   Taavi Hoose, [place], [place] 05/12/24 1240

## 2024-05-12 NOTE — Discharge Instructions (Signed)
  1. Acute bronchitis, unspecified organism (Primary) - ipratropium-albuterol  (DUONEB) 0.5-2.5 (3) MG/3ML nebulizer solution 3 mL given in UC for acute wheezing and cough. - ipratropium-albuterol  (DUONEB) 0.5-2.5 (3) MG/3ML SOLN; Take 3 mLs by nebulization every 6 (six) hours as needed.  Dispense: 360 mL; Refill: 0 - albuterol  (VENTOLIN  HFA) 108 (90 Base) MCG/ACT inhaler; Inhale 1-2 puffs into the lungs every 6 (six) hours as needed for wheezing or shortness of breath.  Dispense: 8 g; Refill: 1 - Promethazine -Phenyleph-Codeine (PROMETHAZINE  VC/CODEINE) 6.25-5-10 MG/5ML SYRP; Take 10 mLs by mouth 3 (three) times daily as needed.  Dispense: 118 mL; Refill: 0 - Home nebulizer machine provided for patient for control of bronchitis symptoms.  -Continue to monitor symptoms for any change in severity if there is any escalation of current symptoms or development of new symptoms follow-up in ER for further evaluation and management.

## 2024-05-13 ENCOUNTER — Other Ambulatory Visit (HOSPITAL_BASED_OUTPATIENT_CLINIC_OR_DEPARTMENT_OTHER): Payer: Self-pay

## 2024-05-13 ENCOUNTER — Other Ambulatory Visit: Payer: Self-pay | Admitting: Family Medicine

## 2024-05-13 ENCOUNTER — Telehealth (HOSPITAL_COMMUNITY): Payer: Self-pay | Admitting: *Deleted

## 2024-05-13 ENCOUNTER — Other Ambulatory Visit (HOSPITAL_COMMUNITY): Payer: Self-pay

## 2024-05-13 ENCOUNTER — Other Ambulatory Visit: Payer: Self-pay

## 2024-05-13 DIAGNOSIS — O0990 Supervision of high risk pregnancy, unspecified, unspecified trimester: Secondary | ICD-10-CM

## 2024-05-13 DIAGNOSIS — F419 Anxiety disorder, unspecified: Secondary | ICD-10-CM

## 2024-05-13 DIAGNOSIS — K439 Ventral hernia without obstruction or gangrene: Secondary | ICD-10-CM

## 2024-05-13 DIAGNOSIS — R109 Unspecified abdominal pain: Secondary | ICD-10-CM

## 2024-05-13 DIAGNOSIS — O10919 Unspecified pre-existing hypertension complicating pregnancy, unspecified trimester: Secondary | ICD-10-CM

## 2024-05-13 DIAGNOSIS — O4440 Low lying placenta NOS or without hemorrhage, unspecified trimester: Secondary | ICD-10-CM

## 2024-05-13 MED ORDER — PROMETHAZINE-DM 6.25-15 MG/5ML PO SYRP
5.0000 mL | ORAL_SOLUTION | Freq: Four times a day (QID) | ORAL | 0 refills | Status: DC | PRN
  Filled 2024-05-13: qty 118, 6d supply, fill #0

## 2024-05-13 NOTE — Telephone Encounter (Signed)
 Pt called states that [facility] does not have Promethazine -Phenyleph-Codeine (PROMETHAZINE  VC/CODEINE) 6.25-5-10 MG/5ML SYRP any more and she would like sent to [place] [facility]. I called [facility] and DC rx since they do not stock it anymore.    Advised pt that I would call her back after provider reviews.

## 2024-05-13 NOTE — Telephone Encounter (Signed)
 Called pts voicemail not set up but rx has been sent

## 2024-05-13 NOTE — Telephone Encounter (Signed)
 I have sent Promethazine  DM instead of the combination with codeine and phenylephrine.

## 2024-05-15 ENCOUNTER — Encounter (HOSPITAL_COMMUNITY): Payer: Self-pay | Admitting: Obstetrics & Gynecology

## 2024-05-15 ENCOUNTER — Inpatient Hospital Stay (HOSPITAL_COMMUNITY)
Admission: AD | Admit: 2024-05-15 | Discharge: 2024-05-15 | Disposition: A | Discharge status: Home / Self Care | Attending: Obstetrics & Gynecology | Admitting: Obstetrics & Gynecology

## 2024-05-15 ENCOUNTER — Encounter: Payer: Self-pay | Admitting: Obstetrics and Gynecology

## 2024-05-15 ENCOUNTER — Inpatient Hospital Stay (HOSPITAL_COMMUNITY)

## 2024-05-15 ENCOUNTER — Ambulatory Visit (INDEPENDENT_AMBULATORY_CARE_PROVIDER_SITE_OTHER): Admitting: Obstetrics and Gynecology

## 2024-05-15 ENCOUNTER — Other Ambulatory Visit: Payer: Self-pay

## 2024-05-15 VITALS — BP 116/72 | HR 92 | Wt 389.6 lb

## 2024-05-15 DIAGNOSIS — O26892 Other specified pregnancy related conditions, second trimester: Secondary | ICD-10-CM | POA: Diagnosis not present

## 2024-05-15 DIAGNOSIS — O10919 Unspecified pre-existing hypertension complicating pregnancy, unspecified trimester: Secondary | ICD-10-CM

## 2024-05-15 DIAGNOSIS — J4551 Severe persistent asthma with (acute) exacerbation: Secondary | ICD-10-CM | POA: Diagnosis not present

## 2024-05-15 DIAGNOSIS — Z7951 Long term (current) use of inhaled steroids: Secondary | ICD-10-CM | POA: Diagnosis not present

## 2024-05-15 DIAGNOSIS — O09292 Supervision of pregnancy with other poor reproductive or obstetric history, second trimester: Secondary | ICD-10-CM | POA: Insufficient documentation

## 2024-05-15 DIAGNOSIS — O09522 Supervision of elderly multigravida, second trimester: Secondary | ICD-10-CM | POA: Diagnosis not present

## 2024-05-15 DIAGNOSIS — I110 Hypertensive heart disease with heart failure: Secondary | ICD-10-CM | POA: Diagnosis not present

## 2024-05-15 DIAGNOSIS — O99119 Other diseases of the blood and blood-forming organs and certain disorders involving the immune mechanism complicating pregnancy, unspecified trimester: Secondary | ICD-10-CM

## 2024-05-15 DIAGNOSIS — Z3A23 23 weeks gestation of pregnancy: Secondary | ICD-10-CM

## 2024-05-15 DIAGNOSIS — Z98891 History of uterine scar from previous surgery: Secondary | ICD-10-CM

## 2024-05-15 DIAGNOSIS — Z79899 Other long term (current) drug therapy: Secondary | ICD-10-CM | POA: Diagnosis not present

## 2024-05-15 DIAGNOSIS — O99212 Obesity complicating pregnancy, second trimester: Secondary | ICD-10-CM

## 2024-05-15 DIAGNOSIS — O99342 Other mental disorders complicating pregnancy, second trimester: Secondary | ICD-10-CM | POA: Insufficient documentation

## 2024-05-15 DIAGNOSIS — O09291 Supervision of pregnancy with other poor reproductive or obstetric history, first trimester: Secondary | ICD-10-CM

## 2024-05-15 DIAGNOSIS — E669 Obesity, unspecified: Secondary | ICD-10-CM | POA: Diagnosis not present

## 2024-05-15 DIAGNOSIS — Z7189 Other specified counseling: Secondary | ICD-10-CM

## 2024-05-15 DIAGNOSIS — Z9189 Other specified personal risk factors, not elsewhere classified: Secondary | ICD-10-CM

## 2024-05-15 DIAGNOSIS — Z86711 Personal history of pulmonary embolism: Secondary | ICD-10-CM | POA: Diagnosis not present

## 2024-05-15 DIAGNOSIS — O0942 Supervision of pregnancy with grand multiparity, second trimester: Secondary | ICD-10-CM | POA: Insufficient documentation

## 2024-05-15 DIAGNOSIS — O99512 Diseases of the respiratory system complicating pregnancy, second trimester: Secondary | ICD-10-CM | POA: Diagnosis not present

## 2024-05-15 DIAGNOSIS — O285 Abnormal chromosomal and genetic finding on antenatal screening of mother: Secondary | ICD-10-CM | POA: Diagnosis not present

## 2024-05-15 DIAGNOSIS — O99612 Diseases of the digestive system complicating pregnancy, second trimester: Secondary | ICD-10-CM | POA: Insufficient documentation

## 2024-05-15 DIAGNOSIS — Z8679 Personal history of other diseases of the circulatory system: Secondary | ICD-10-CM

## 2024-05-15 DIAGNOSIS — O10012 Pre-existing essential hypertension complicating pregnancy, second trimester: Secondary | ICD-10-CM | POA: Diagnosis not present

## 2024-05-15 DIAGNOSIS — O0990 Supervision of high risk pregnancy, unspecified, unspecified trimester: Secondary | ICD-10-CM

## 2024-05-15 DIAGNOSIS — O09212 Supervision of pregnancy with history of pre-term labor, second trimester: Secondary | ICD-10-CM | POA: Diagnosis not present

## 2024-05-15 DIAGNOSIS — Z7982 Long term (current) use of aspirin: Secondary | ICD-10-CM | POA: Insufficient documentation

## 2024-05-15 DIAGNOSIS — K439 Ventral hernia without obstruction or gangrene: Secondary | ICD-10-CM

## 2024-05-15 DIAGNOSIS — O4440 Low lying placenta NOS or without hemorrhage, unspecified trimester: Secondary | ICD-10-CM

## 2024-05-15 DIAGNOSIS — O34219 Maternal care for unspecified type scar from previous cesarean delivery: Secondary | ICD-10-CM

## 2024-05-15 DIAGNOSIS — Q999 Chromosomal abnormality, unspecified: Secondary | ICD-10-CM | POA: Diagnosis not present

## 2024-05-15 DIAGNOSIS — O99112 Other diseases of the blood and blood-forming organs and certain disorders involving the immune mechanism complicating pregnancy, second trimester: Secondary | ICD-10-CM | POA: Insufficient documentation

## 2024-05-15 DIAGNOSIS — Z362 Encounter for other antenatal screening follow-up: Secondary | ICD-10-CM | POA: Insufficient documentation

## 2024-05-15 DIAGNOSIS — K760 Fatty (change of) liver, not elsewhere classified: Secondary | ICD-10-CM

## 2024-05-15 DIAGNOSIS — Z6841 Body Mass Index (BMI) 40.0 and over, adult: Secondary | ICD-10-CM

## 2024-05-15 DIAGNOSIS — O2692 Pregnancy related conditions, unspecified, second trimester: Secondary | ICD-10-CM | POA: Insufficient documentation

## 2024-05-15 DIAGNOSIS — J45909 Unspecified asthma, uncomplicated: Secondary | ICD-10-CM

## 2024-05-15 LAB — CBC
HCT: 34.2 % — ABNORMAL LOW (ref 36.0–46.0)
Hemoglobin: 11.4 g/dL — ABNORMAL LOW (ref 12.0–15.0)
MCH: 27.7 pg (ref 26.0–34.0)
MCHC: 33.3 g/dL (ref 30.0–36.0)
MCV: 83.2 fL (ref 80.0–100.0)
Platelets: 218 K/uL (ref 150–400)
RBC: 4.11 MIL/uL (ref 3.87–5.11)
RDW: 17.2 % — ABNORMAL HIGH (ref 11.5–15.5)
WBC: 13.1 K/uL — ABNORMAL HIGH (ref 4.0–10.5)
nRBC: 0 % (ref 0.0–0.2)

## 2024-05-15 LAB — RESP PANEL BY RT-PCR (RSV, FLU A&B, COVID)  RVPGX2
Influenza A by PCR: NEGATIVE
Influenza B by PCR: NEGATIVE
Resp Syncytial Virus by PCR: NEGATIVE
SARS Coronavirus 2 by RT PCR: NEGATIVE

## 2024-05-15 LAB — COMPREHENSIVE METABOLIC PANEL WITH GFR
ALT: 15 U/L (ref 0–44)
AST: 18 U/L (ref 15–41)
Albumin: 2.4 g/dL — ABNORMAL LOW (ref 3.5–5.0)
Alkaline Phosphatase: 69 U/L (ref 38–126)
Anion gap: 12 (ref 5–15)
BUN: 7 mg/dL (ref 6–20)
CO2: 23 mmol/L (ref 22–32)
Calcium: 8.9 mg/dL (ref 8.9–10.3)
Chloride: 97 mmol/L — ABNORMAL LOW (ref 98–111)
Creatinine, Ser: 0.57 mg/dL (ref 0.44–1.00)
GFR, Estimated: >60 mL/min (ref >60)
Glucose, Bld: 140 mg/dL — ABNORMAL HIGH (ref 70–99)
Potassium: 3.8 mmol/L (ref 3.5–5.1)
Sodium: 132 mmol/L — ABNORMAL LOW (ref 135–145)
Total Bilirubin: <0.2 mg/dL (ref 0.0–1.2)
Total Protein: 6.3 g/dL — ABNORMAL LOW (ref 6.5–8.1)

## 2024-05-15 LAB — GROUP A STREP BY PCR: Group A Strep by PCR: NOT DETECTED

## 2024-05-15 LAB — BRAIN NATRIURETIC PEPTIDE: B Natriuretic Peptide: 20.2 pg/mL (ref 0.0–100.0)

## 2024-05-15 MED ORDER — METHYLPREDNISOLONE SODIUM SUCC 125 MG IJ SOLR
125.0000 mg | Freq: Once | INTRAMUSCULAR | Status: AC
  Administered 2024-05-15: 125 mg via INTRAVENOUS
  Filled 2024-05-15: qty 2

## 2024-05-15 MED ORDER — PREDNISONE 20 MG PO TABS
40.0000 mg | ORAL_TABLET | Freq: Every day | ORAL | 0 refills | Status: AC
  Filled 2024-05-15 – 2024-05-16 (×2): qty 10, 5d supply, fill #0

## 2024-05-15 MED ORDER — LACTATED RINGERS IV SOLN: INTRAVENOUS | Status: DC

## 2024-05-15 MED ORDER — MAGNESIUM SULFATE 2 GM/50ML IV SOLN
2.0000 g | Freq: Once | INTRAVENOUS | Status: AC
  Administered 2024-05-15: 2 g via INTRAVENOUS
  Filled 2024-05-15: qty 50

## 2024-05-15 MED ORDER — OXYCODONE HCL 5 MG PO TABS
5.0000 mg | ORAL_TABLET | ORAL | 0 refills | Status: DC | PRN
  Filled 2024-05-15 – 2024-05-16 (×2): qty 10, 2d supply, fill #0

## 2024-05-15 MED ORDER — ONDANSETRON HCL 4 MG/2ML IJ SOLN
4.0000 mg | Freq: Once | INTRAMUSCULAR | Status: AC
  Administered 2024-05-15: 4 mg via INTRAVENOUS
  Filled 2024-05-15: qty 2

## 2024-05-15 MED ORDER — BUDESONIDE-FORMOTEROL FUMARATE 160-4.5 MCG/ACT IN AERO
2.0000 | INHALATION_SPRAY | Freq: Two times a day (BID) | RESPIRATORY_TRACT | 12 refills | Status: DC
  Filled 2024-05-15 – 2024-05-16 (×3): qty 10.2, 30d supply, fill #0

## 2024-05-15 MED ORDER — MORPHINE SULFATE (PF) 4 MG/ML IV SOLN
2.0000 mg | Freq: Once | INTRAVENOUS | Status: AC
  Administered 2024-05-15: 2 mg via INTRAVENOUS
  Filled 2024-05-15: qty 1

## 2024-05-15 MED ORDER — IPRATROPIUM-ALBUTEROL 0.5-2.5 (3) MG/3ML IN SOLN
3.0000 mL | Freq: Once | RESPIRATORY_TRACT | Status: AC
  Administered 2024-05-15: 3 mL via RESPIRATORY_TRACT
  Filled 2024-05-15: qty 3

## 2024-05-15 NOTE — MAU Note (Signed)
 Brooke Sandoval is a 38 y.o. at [redacted]w[redacted]d here in MAU reporting: she was sent from office for evaluation of abdominal pain and wheezing. States has been wheezing for past 3 days, but SOB began last night.  Seen at Urgent Care, diagnosed with bronchitis. Denies VB and LOF.  Reports +FM.  LMP: NA Onset of complaint: today Pain score: 8 Vitals:   05/15/24 1718  BP: (!) 140/87  Pulse: 88  Resp: 20  Temp: 98.5 F (36.9 C)  SpO2: 99%     FHT: deferred, EFM will be applied once roomed  Lab orders placed from triage: UA

## 2024-05-15 NOTE — Progress Notes (Signed)
 PRENATAL VISIT NOTE  Subjective:  Brooke Sandoval is a 38 y.o. [phone] at [redacted]w[redacted]d being seen today for ongoing prenatal care.  She is currently monitored for the following issues for this high-risk pregnancy and has Anxiety; Asthma affecting pregnancy, antepartum; BMI 50.0-59.9, adult (HCC); Chronic hypertension during pregnancy, antepartum; Previous cesarean delivery affecting pregnancy, antepartum; history of 5 c sections; Hx of pulmonary embolus during pregnancy; History of substance abuse (HCC); History of pre-eclampsia in prior pregnancy, currently pregnant in first trimester; Protein S deficiency affecting pregnancy, antepartum; History of gestational diabetes; Ventral hernia without obstruction or gangrene; History of congestive heart failure; Supervision of high risk pregnancy, antepartum; RED chart patient; At risk for injury related to surgical procedure, history of bowel injury; Placenta positioned low in anterior wall of uterus; Low fetal fraction on NIPS; Advanced maternal age in multigravida; and Hepatic steatosis during pregnancy on their problem list.  Patient doing is not feeling well with continued abdominal pain and worsening upper respiratory symptoms. She reports abdominal pain, shortness of breath, wheezing and cough.  Contractions: Not present. Vag. Bleeding: None.  Movement: Present. Denies leaking of fluid.   The following portions of the patient's history were reviewed and updated as appropriate: allergies, current medications, past family history, past medical history, past social history, past surgical history and problem list. Problem list updated.  Objective:   Vitals:   05/15/24 1500 05/15/24 1525  BP: (!) 147/85 116/72  Pulse: (!) 108 92  Weight: (!) 389 lb 9.6 oz (176.7 kg)     Fetal Status: Fetal Heart Rate (bpm):  (US  bedside)   Movement: Present    Fetus noted on bedside ultrasound to have movement and fetal heart motion General:  Alert, oriented and  cooperative. Patient is in no acute distress.  Skin: Skin is warm and dry. No rash noted.   Cardiovascular: Normal heart rate noted  Respiratory: Normal respiratory effort, no problems with respiration noted  Abdomen: Soft, gravid, appropriate for gestational age.  Pain/Pressure: Absent     Pelvic: Cervical exam deferred        Extremities: Normal range of motion.  Edema: Deep pitting, indentation remains for a short time (legs)  Mental Status:  Normal mood and affect. Normal behavior. Normal judgment and thought content.   Assessment and Plan:  Pregnancy: [id number] at [redacted]w[redacted]d  1. [redacted] weeks gestation of pregnancy (Primary)   2. Chronic hypertension during pregnancy, antepartum BP well controlled on labetalol and lasix   3. Asthma affecting pregnancy, antepartum Pt has audible wheezing with and without stethoscope Pt notes she had breathing treatment at home, but is still wheezing Pt sent to MAU for further evaluation and treatment Recommend either chest xray or chest CT.  Pt may need further breathing treatments as well as IV/oral abx. For improvement  4. Hepatic steatosis during pregnancy   5. Protein S deficiency affecting pregnancy, antepartum Pt compliant with daily lovenox   6. Ventral hernia without obstruction or gangrene Pt scheduled to get MRI of abdomen to check for SBO or other bowel related issues, will still need to get study if admitted  7. Supervision of high risk pregnancy, antepartum Continue routine prenatal care  8. RED chart patient   9. Placenta positioned low in anterior wall of uterus Per MFM , normal placenta, low risk for accreta  10. Hx of pulmonary embolus during pregnancy Daily lovenox   11. History of pre-eclampsia in prior pregnancy, currently pregnant in first trimester No s/sx of preeclampsia  12. history of 5 c  sections Will need multiple team members and possibly general surgery available at time of delivery due to poor surgical hx  13. BMI  50.0-59.9, adult (HCC)   14. At risk for injury related to surgical procedure, history of bowel injury See above  15. Multigravida of advanced maternal age in second trimester   Preterm labor symptoms and general obstetric precautions including but not limited to vaginal bleeding, contractions, leaking of fluid and fetal movement were reviewed in detail with the patient.  Please refer to After Visit Summary for other counseling recommendations.   Return in about 2 weeks (around 05/29/2024) for [facility], in person.   Jerilynn Buddle, MD Faculty Attending Center for [facility]

## 2024-05-15 NOTE — MAU Provider Note (Signed)
 Obstetric Attending MAU Note  Chief Complaint:  Abdominal Pain and Shortness of Breath   Event Date/Time   First Provider Initiated Contact with Patient 05/15/24 1827     HPI: Brooke Sandoval is a 38 y.o. [id number] at [redacted]w[redacted]d who presents to maternity admissions reporting SOB. Patient with asthma and seen at UC 3 days ago for URI sx's. No wheezing and they gave her a neb machine. She has been using this.Seen in office today with wheezing and sent over. Pt. With h/o PE and CHF--taking her lasix . Also reports LUQ pain. She has known ventral hernia. No N/V. Normal bowel function. Denies contractions, leakage of fluid or vaginal bleeding. Good fetal movement.   Pregnancy Course: Receives care at [facility] Patient Active Problem List   Diagnosis Date Noted   Hepatic steatosis during pregnancy 03/28/2024   Low fetal fraction on NIPS 03/20/2024   Advanced maternal age in multigravida 03/20/2024   RED chart patient 02/21/2024   At risk for injury related to surgical procedure, history of bowel injury 02/21/2024   Placenta positioned low in anterior wall of uterus 02/21/2024   Supervision of high risk pregnancy, antepartum 02/06/2024   History of congestive heart failure 01/22/2024   history of 5 c sections 12/27/2021   Ventral hernia without obstruction or gangrene 12/27/2021   History of substance abuse (HCC) 03/03/2015   History of gestational diabetes 02/16/2015   Asthma affecting pregnancy, antepartum 12/09/2014   Chronic hypertension during pregnancy, antepartum 12/09/2014   Anxiety 10/08/2014   Previous cesarean delivery affecting pregnancy, antepartum 10/08/2014   Hx of pulmonary embolus during pregnancy 10/08/2014   Protein S deficiency affecting pregnancy, antepartum 10/08/2014   BMI 50.0-59.9, adult (HCC) 08/28/2014   History of pre-eclampsia in prior pregnancy, currently pregnant in first trimester 08/28/2014    Past Medical History:  Diagnosis Date   ADHD (attention deficit  hyperactivity disorder)    Anxiety    Attention deficit hyperactivity disorder (ADHD), 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  Prescribed by [facility] in [place].  She DC'd with + UPT but would like to restart.  Counseled not recommended in pregnancy.  Counseled to establish with [facility] ASAP as this clinic will not prescribe those types of meds.  Verbal and written information provided today.  Will discuss with MFM at [facility] .     If symptoms not well controlled with counseling/behavioral therapy, re   CHF (congestive heart failure) (HCC)    after admission for vetnal hernia   Coagulation factor disorder    DVT (deep venous thrombosis) (HCC)    History of chlamydia infection 09/12/2014   3/19 Called in Azithromycin Rx for positive chlamydia.   4/28:  TOC NEG         02/25/2015  TOC neg 8/10     History of gestational diabetes    History of pre-eclampsia    Miscarriage    Poor dentition 10/10/2014   Records note poor dentition in prior records. Please ensure she is getting regular dental care.     Poor intravenous access 12/09/2014   In last pregnancy, required port access.      OB History  Gravida Para Term Preterm AB Living  9 7  7 1 5   SAB IAB Ectopic Multiple Live Births  1    5    # Outcome Date GA Lbr Len/2nd Weight Sex Type Anes PTL Lv  9 Current           8 Preterm 04/06/15 [redacted]w[redacted]d  M CS-LTranv   LIV  7 SAB 2014 [redacted]w[redacted]d         6 Preterm 04/14/11 [redacted]w[redacted]d    CS-LTranv   LIV  5 Preterm 02/2010 [redacted]w[redacted]d   M Vag-Spont   FD  4 Preterm 09/23/07 [redacted]w[redacted]d   F CS-LTranv   LIV  3 Preterm 07/26/06 [redacted]w[redacted]d   M CS-LTranv   LIV  2 Preterm 2007 [redacted]w[redacted]d    Vag-Spont   FD     Complications: Clotting disorder  1 Preterm 09/15/03 [redacted]w[redacted]d   M CS-LTranv   LIV    Obstetric Comments  Hx GDM, pre-E, clotting disorder, two fetal demise due to blood clot in placenta per pt.  Past deliveries at [facility] and in [place] .    Past Surgical History:  Procedure Laterality Date   APPENDECTOMY     CESAREAN  SECTION     TONSILLECTOMY     VENTRAL HERNIA REPAIR      Family History: Family History  Problem Relation Age of Onset   Depression Mother    COPD Mother    Diabetes Mother    Schizophrenia Father    Drug abuse Sister     Social History: Social History   Tobacco Use   Smoking status: Never    Passive exposure: Never   Smokeless tobacco: Never  Vaping Use   Vaping status: Never Used  Substance Use Topics   Alcohol use: Not Currently    Comment: socially before pregnancy   Drug use: No    Allergies:  Allergies  Allergen Reactions   Prednisone  Swelling    Medications Prior to Admission  Medication Sig Dispense Refill Last Dose/Taking   albuterol  (VENTOLIN  HFA) 108 (90 Base) MCG/ACT inhaler Inhale 1-2 puffs into the lungs every 6 (six) hours as needed for wheezing or shortness of breath. 6.7 g 1 05/14/2024   aspirin  (ASPIRIN  CHILDRENS) 81 MG chewable tablet Chew 2 tablets (162 mg total) by mouth daily. 60 tablet 11 05/15/2024   docusate sodium  (COLACE) 100 MG capsule Take 1 capsule (100 mg total) by mouth 2 (two) times daily. 60 capsule 2 05/14/2024   enoxaparin  (LOVENOX ) 100 MG/ML injection Inject 1 mL (100 mg total) into the skin every 12 (twelve) hours. 60 mL 11 05/15/2024   furosemide  (LASIX ) 40 MG tablet Take 1 tablet (40 mg total) by mouth daily. 30 tablet 11 05/14/2024   ipratropium-albuterol  (DUONEB) 0.5-2.5 (3) MG/3ML SOLN Take 3 mLs by nebulization every 6 (six) hours as needed. 360 mL 0 05/15/2024 at  1:00 PM   labetalol  (NORMODYNE ) 200 MG tablet Take 1 tablet (200 mg total) by mouth 2 (two) times daily. 60 tablet 0 05/15/2024   ondansetron  (ZOFRAN -ODT) 4 MG disintegrating tablet Take 1 tablet (4 mg total) by mouth every 8 (eight) hours as needed for nausea or vomiting. 30 tablet 2 05/14/2024   Prenatal Vit-Fe Fumarate-FA (MULTIVITAMIN-PRENATAL) 27-0.8 MG TABS tablet Take 1 tablet by mouth daily at 12 noon.   05/15/2024   Blood Pressure Monitoring DEVI 1 each by  Does not apply route once a week. (Patient not taking: Reported on 05/15/2024) 1 each 0    Iron , Ferrous Sulfate , 325 (65 Fe) MG TABS Take 1 tablet by mouth every other day. 30 tablet 3 05/13/2024   promethazine  (PHENERGAN ) 6.25 MG/5ML solution Take 5 mLs (6.25 mg total) by mouth 4 (four) times daily as needed for nausea or vomiting. 120 mL 1    promethazine -dextromethorphan (PROMETHAZINE -DM) 6.25-15 MG/5ML syrup Take 5 mLs by mouth 4 (  four) times daily as needed for cough. 118 mL 0     ROS: Pertinent findings in history of present illness.  Physical Exam  Blood pressure (!) 145/82, pulse 95, temperature 98.5 F (36.9 C), temperature source Oral, resp. rate (!) 22, height 5' 8 (1.727 m), weight (!) 177.6 kg, SpO2 95%. CONSTITUTIONAL: Well-developed, well-nourished female in no acute distress.  HENT:  Normocephalic, atraumatic,  EYES: Conjunctivae and EOM are normal.  No scleral icterus.  NECK: Normal range of motion, supple, no masses SKIN: Skin is warm and dry. No rash noted. Not diaphoretic. No erythema. No pallor. NEUROLGIC: Alert and oriented to person, place, and time. CARDIOVASCULAR: Normal heart rate noted, regular rhythm RESPIRATORY: Inspiratory and expiratory wheezing throughout ABDOMEN: Protuberant R>L, Soft, nontender, gravid appropriate for gestational age MUSCULOSKELETAL: Normal range of motion. No edema and no tenderness. 2+ distal pulses.   FHT:  +ve on bedside u/s with sonographer   Labs: Results for orders placed or performed during the hospital encounter of 05/15/24 (from the past 24 hours)  CBC     Status: Abnormal   Collection Time: 05/15/24  6:26 PM  Result Value Ref Range   WBC 13.1 (H) 4.0 - 10.5 K/uL   RBC 4.11 3.87 - 5.11 MIL/uL   Hemoglobin 11.4 (L) 12.0 - 15.0 g/dL   HCT 65.7 (L) 63.9 - 53.9 %   MCV 83.2 80.0 - 100.0 fL   MCH 27.7 26.0 - 34.0 pg   MCHC 33.3 30.0 - 36.0 g/dL   RDW 82.7 (H) 88.4 - 84.4 %   Platelets 218 150 - 400 K/uL   nRBC 0.0 0.0 -  0.2 %  Comprehensive metabolic panel     Status: Abnormal   Collection Time: 05/15/24  6:26 PM  Result Value Ref Range   Sodium 132 (L) 135 - 145 mmol/L   Potassium 3.8 3.5 - 5.1 mmol/L   Chloride 97 (L) 98 - 111 mmol/L   CO2 23 22 - 32 mmol/L   Glucose, Bld 140 (H) 70 - 99 mg/dL   BUN 7 6 - 20 mg/dL   Creatinine, Ser 9.42 0.44 - 1.00 mg/dL   Calcium 8.9 8.9 - 89.6 mg/dL   Total Protein 6.3 (L) 6.5 - 8.1 g/dL   Albumin 2.4 (L) 3.5 - 5.0 g/dL   AST 18 15 - 41 U/L   ALT 15 0 - 44 U/L   Alkaline Phosphatase 69 38 - 126 U/L   Total Bilirubin <0.2 0.0 - 1.2 mg/dL   GFR, Estimated >39 >39 mL/min   Anion gap 12 5 - 15  Brain natriuretic peptide     Status: None   Collection Time: 05/15/24  6:26 PM  Result Value Ref Range   B Natriuretic Peptide 20.2 0.0 - 100.0 pg/mL  Resp panel by RT-PCR (RSV, Flu A&B, Covid) Anterior Nasal Swab     Status: None   Collection Time: 05/15/24  6:34 PM   Specimen: Anterior Nasal Swab  Result Value Ref Range   SARS Coronavirus 2 by RT PCR NEGATIVE NEGATIVE   Influenza A by PCR NEGATIVE NEGATIVE   Influenza B by PCR NEGATIVE NEGATIVE   Resp Syncytial Virus by PCR NEGATIVE NEGATIVE  Group A Strep by PCR     Status: None   Collection Time: 05/15/24  6:34 PM   Specimen: Throat; Sterile Swab  Result Value Ref Range   Group A Strep by PCR NOT DETECTED NOT DETECTED    Imaging:    MAU Course:  Given MgSo4, Solumedrol, Duoneb, Morphine for pain and Zofran  for nausea Reassessment: Wheezing resolved. Pain was improved Resp panel and strep neg, BNP negative No hypoxia Normal abd and CXR  Assessment: 1. Hepatic steatosis during pregnancy   2. Severe persistent asthma with exacerbation (HCC)   3. [redacted] weeks gestation of pregnancy   4. Asthma affecting pregnancy, antepartum   5. history of 5 c sections   6. Chronic hypertension during pregnancy, antepartum   7. History of congestive heart failure   8. Hx of pulmonary embolus during pregnancy      Plan: 5 d Po Prednisone Inhaled steroid Pain meds Continue duonebs Follow up with OB provider for scheduled prenatal visits. She was discharged to home in stable condition with return precautions.  Follow-up Information     [facility] for [facility] Follow up.   Specialty: Obstetrics and Gynecology Contact information: [address] [place]  [address] [phone]                Allergies as of 05/15/2024       Reactions   Prednisone Swelling        Medication List     STOP taking these medications    promethazine -dextromethorphan 6.25-15 MG/5ML syrup Commonly known as: PROMETHAZINE -DM       TAKE these medications    albuterol  108 (90 Base) MCG/ACT inhaler Commonly known as: VENTOLIN  HFA Inhale 1-2 puffs into the lungs every 6 (six) hours as needed for wheezing or shortness of breath.   aspirin  81 MG chewable tablet Commonly known as: Aspirin  Childrens Chew 2 tablets (162 mg total) by mouth daily.   Blood Pressure Monitoring Devi 1 each by Does not apply route once a week.   budesonide-formoterol 160-4.5 MCG/ACT inhaler Commonly known as: Symbicort Inhale 2 puffs into the lungs 2 (two) times daily.   docusate sodium  100 MG capsule Commonly known as: Colace Take 1 capsule (100 mg total) by mouth 2 (two) times daily.   enoxaparin  100 MG/ML injection Commonly known as: LOVENOX  Inject 1 mL (100 mg total) into the skin every 12 (twelve) hours.   furosemide  40 MG tablet Commonly known as: Lasix  Take 1 tablet (40 mg total) by mouth daily.   ipratropium-albuterol  0.5-2.5 (3) MG/3ML Soln Commonly known as: DUONEB Take 3 mLs by nebulization every 6 (six) hours as needed.   Iron  (Ferrous Sulfate ) 325 (65 Fe) MG Tabs Take 1 tablet by mouth every other day.   labetalol 200 MG tablet Commonly known as: NORMODYNE Take 1 tablet (200 mg total) by mouth 2 (two) times daily.    multivitamin-prenatal 27-0.8 MG Tabs tablet Take 1 tablet by mouth daily at 12 noon.   ondansetron  4 MG disintegrating tablet Commonly known as: ZOFRAN -ODT Take 1 tablet (4 mg total) by mouth every 8 (eight) hours as needed for nausea or vomiting.   oxycodone 5 MG capsule Commonly known as: OXY-IR Take 1 capsule (5 mg total) by mouth every 4 (four) hours as needed (breakthrough pain).   predniSONE 20 MG tablet Commonly known as: DELTASONE Take 2 tablets (40 mg total) by mouth daily for 5 days.   promethazine  6.25 MG/5ML solution Commonly known as: PHENERGAN  Take 5 mLs (6.25 mg total) by mouth 4 (four) times daily as needed for nausea or vomiting.        Fredirick Glenys RAMAN, MD 05/15/2024 8:47 PM

## 2024-05-16 ENCOUNTER — Other Ambulatory Visit (HOSPITAL_COMMUNITY): Payer: Self-pay

## 2024-05-16 ENCOUNTER — Ambulatory Visit (HOSPITAL_COMMUNITY): Attending: Family Medicine

## 2024-05-16 ENCOUNTER — Ambulatory Visit (HOSPITAL_COMMUNITY): Admission: RE | Admit: 2024-05-16 | Source: Ambulatory Visit

## 2024-05-16 MED ORDER — ONDANSETRON 4 MG PO TBDP
4.0000 mg | ORAL_TABLET | Freq: Three times a day (TID) | ORAL | 0 refills | Status: DC | PRN
  Filled 2024-05-16: qty 30, 10d supply, fill #0

## 2024-05-27 ENCOUNTER — Ambulatory Visit (HOSPITAL_BASED_OUTPATIENT_CLINIC_OR_DEPARTMENT_OTHER): Admitting: Maternal & Fetal Medicine

## 2024-05-27 ENCOUNTER — Other Ambulatory Visit: Payer: Self-pay | Admitting: *Deleted

## 2024-05-27 ENCOUNTER — Inpatient Hospital Stay (HOSPITAL_COMMUNITY)

## 2024-05-27 ENCOUNTER — Ambulatory Visit

## 2024-05-27 ENCOUNTER — Ambulatory Visit (HOSPITAL_COMMUNITY): Admission: RE | Admit: 2024-05-27 | Disposition: A | Source: Ambulatory Visit

## 2024-05-27 ENCOUNTER — Telehealth (HOSPITAL_COMMUNITY): Payer: Self-pay | Admitting: Cardiology

## 2024-05-27 ENCOUNTER — Encounter (HOSPITAL_COMMUNITY): Payer: Self-pay | Admitting: Obstetrics and Gynecology

## 2024-05-27 ENCOUNTER — Inpatient Hospital Stay (HOSPITAL_COMMUNITY)
Admission: AD | Admit: 2024-05-27 | Discharge: 2024-05-29 | DRG: 832 | Disposition: A | Discharge status: Home / Self Care | Payer: Self-pay | Attending: Family Medicine | Admitting: Family Medicine

## 2024-05-27 VITALS — BP 136/92

## 2024-05-27 DIAGNOSIS — O26892 Other specified pregnancy related conditions, second trimester: Secondary | ICD-10-CM | POA: Diagnosis not present

## 2024-05-27 DIAGNOSIS — B970 Adenovirus as the cause of diseases classified elsewhere: Secondary | ICD-10-CM | POA: Diagnosis not present

## 2024-05-27 DIAGNOSIS — Z3A25 25 weeks gestation of pregnancy: Secondary | ICD-10-CM

## 2024-05-27 DIAGNOSIS — K436 Other and unspecified ventral hernia with obstruction, without gangrene: Secondary | ICD-10-CM | POA: Diagnosis present

## 2024-05-27 DIAGNOSIS — O09899 Supervision of other high risk pregnancies, unspecified trimester: Secondary | ICD-10-CM | POA: Insufficient documentation

## 2024-05-27 DIAGNOSIS — Z362 Encounter for other antenatal screening follow-up: Secondary | ICD-10-CM

## 2024-05-27 DIAGNOSIS — O34219 Maternal care for unspecified type scar from previous cesarean delivery: Secondary | ICD-10-CM | POA: Insufficient documentation

## 2024-05-27 DIAGNOSIS — O26619 Liver and biliary tract disorders in pregnancy, unspecified trimester: Secondary | ICD-10-CM | POA: Insufficient documentation

## 2024-05-27 DIAGNOSIS — O10919 Unspecified pre-existing hypertension complicating pregnancy, unspecified trimester: Secondary | ICD-10-CM | POA: Insufficient documentation

## 2024-05-27 DIAGNOSIS — O99891 Other specified diseases and conditions complicating pregnancy: Secondary | ICD-10-CM | POA: Diagnosis not present

## 2024-05-27 DIAGNOSIS — Z86711 Personal history of pulmonary embolism: Secondary | ICD-10-CM

## 2024-05-27 DIAGNOSIS — R600 Localized edema: Secondary | ICD-10-CM

## 2024-05-27 DIAGNOSIS — Z8759 Personal history of other complications of pregnancy, childbirth and the puerperium: Secondary | ICD-10-CM | POA: Insufficient documentation

## 2024-05-27 DIAGNOSIS — I509 Heart failure, unspecified: Secondary | ICD-10-CM | POA: Diagnosis not present

## 2024-05-27 DIAGNOSIS — K760 Fatty (change of) liver, not elsewhere classified: Secondary | ICD-10-CM | POA: Insufficient documentation

## 2024-05-27 DIAGNOSIS — Z6841 Body Mass Index (BMI) 40.0 and over, adult: Secondary | ICD-10-CM

## 2024-05-27 DIAGNOSIS — J45909 Unspecified asthma, uncomplicated: Secondary | ICD-10-CM | POA: Diagnosis present

## 2024-05-27 DIAGNOSIS — O99212 Obesity complicating pregnancy, second trimester: Secondary | ICD-10-CM

## 2024-05-27 DIAGNOSIS — N736 Female pelvic peritoneal adhesions (postinfective): Secondary | ICD-10-CM | POA: Diagnosis present

## 2024-05-27 DIAGNOSIS — O358XX0 Maternal care for other (suspected) fetal abnormality and damage, not applicable or unspecified: Secondary | ICD-10-CM

## 2024-05-27 DIAGNOSIS — O09529 Supervision of elderly multigravida, unspecified trimester: Secondary | ICD-10-CM

## 2024-05-27 DIAGNOSIS — D6859 Other primary thrombophilia: Secondary | ICD-10-CM | POA: Diagnosis present

## 2024-05-27 DIAGNOSIS — O09522 Supervision of elderly multigravida, second trimester: Secondary | ICD-10-CM | POA: Insufficient documentation

## 2024-05-27 DIAGNOSIS — O99119 Other diseases of the blood and blood-forming organs and certain disorders involving the immune mechanism complicating pregnancy, unspecified trimester: Secondary | ICD-10-CM | POA: Diagnosis present

## 2024-05-27 DIAGNOSIS — F419 Anxiety disorder, unspecified: Secondary | ICD-10-CM | POA: Diagnosis present

## 2024-05-27 DIAGNOSIS — O09299 Supervision of pregnancy with other poor reproductive or obstetric history, unspecified trimester: Secondary | ICD-10-CM

## 2024-05-27 DIAGNOSIS — R0602 Shortness of breath: Secondary | ICD-10-CM

## 2024-05-27 DIAGNOSIS — O10912 Unspecified pre-existing hypertension complicating pregnancy, second trimester: Secondary | ICD-10-CM

## 2024-05-27 DIAGNOSIS — J218 Acute bronchiolitis due to other specified organisms: Secondary | ICD-10-CM | POA: Diagnosis not present

## 2024-05-27 LAB — CBC
HCT: 32.2 % — ABNORMAL LOW (ref 36.0–46.0)
Hemoglobin: 10.2 g/dL — ABNORMAL LOW (ref 12.0–15.0)
MCH: 27.3 pg (ref 26.0–34.0)
MCHC: 31.7 g/dL (ref 30.0–36.0)
MCV: 86.3 fL (ref 80.0–100.0)
Platelets: 239 K/uL (ref 150–400)
RBC: 3.73 MIL/uL — ABNORMAL LOW (ref 3.87–5.11)
RDW: 17 % — ABNORMAL HIGH (ref 11.5–15.5)
WBC: 11.5 K/uL — ABNORMAL HIGH (ref 4.0–10.5)
nRBC: 0 % (ref 0.0–0.2)

## 2024-05-27 LAB — COMPREHENSIVE METABOLIC PANEL WITH GFR
ALT: 13 U/L (ref 0–44)
AST: 15 U/L (ref 15–41)
Albumin: 2.3 g/dL — ABNORMAL LOW (ref 3.5–5.0)
Alkaline Phosphatase: 57 U/L (ref 38–126)
Anion gap: 9 (ref 5–15)
BUN: 12 mg/dL (ref 6–20)
CO2: 24 mmol/L (ref 22–32)
Calcium: 8.6 mg/dL — ABNORMAL LOW (ref 8.9–10.3)
Chloride: 102 mmol/L (ref 98–111)
Creatinine, Ser: 0.67 mg/dL (ref 0.44–1.00)
GFR, Estimated: >60 mL/min (ref >60)
Glucose, Bld: 144 mg/dL — ABNORMAL HIGH (ref 70–99)
Potassium: 4.1 mmol/L (ref 3.5–5.1)
Sodium: 135 mmol/L (ref 135–145)
Total Bilirubin: 0.2 mg/dL (ref 0.0–1.2)
Total Protein: 6 g/dL — ABNORMAL LOW (ref 6.5–8.1)

## 2024-05-27 LAB — TROPONIN I (HIGH SENSITIVITY): Troponin I (High Sensitivity): 6 ng/L (ref <18)

## 2024-05-27 LAB — PROTEIN / CREATININE RATIO, URINE
Creatinine, Urine: 218 mg/dL
Protein Creatinine Ratio: 0.11 mg/mg{creat} (ref 0.00–0.15)
Total Protein, Urine: 23 mg/dL

## 2024-05-27 LAB — TYPE AND SCREEN
ABO/RH(D): A POS
Antibody Screen: NEGATIVE

## 2024-05-27 LAB — BRAIN NATRIURETIC PEPTIDE: B Natriuretic Peptide: 25.2 pg/mL (ref 0.0–100.0)

## 2024-05-27 MED ORDER — ONDANSETRON 4 MG PO TBDP
4.0000 mg | ORAL_TABLET | Freq: Three times a day (TID) | ORAL | Status: DC | PRN
  Administered 2024-05-27 – 2024-05-28 (×3): 4 mg via ORAL
  Filled 2024-05-27 (×4): qty 1

## 2024-05-27 MED ORDER — FERROUS SULFATE 325 (65 FE) MG PO TABS
325.0000 mg | ORAL_TABLET | ORAL | Status: DC
  Administered 2024-05-28: 325 mg via ORAL
  Filled 2024-05-27: qty 1

## 2024-05-27 MED ORDER — DOCUSATE SODIUM 100 MG PO CAPS
100.0000 mg | ORAL_CAPSULE | Freq: Two times a day (BID) | ORAL | Status: DC
  Administered 2024-05-27: 100 mg via ORAL
  Filled 2024-05-27: qty 1

## 2024-05-27 MED ORDER — FLUTICASONE FUROATE-VILANTEROL 200-25 MCG/ACT IN AEPB
1.0000 | INHALATION_SPRAY | Freq: Every day | RESPIRATORY_TRACT | Status: DC
  Administered 2024-05-28 – 2024-05-29 (×2): 1 via RESPIRATORY_TRACT
  Filled 2024-05-27: qty 28

## 2024-05-27 MED ORDER — ACETAMINOPHEN 325 MG PO TABS
650.0000 mg | ORAL_TABLET | ORAL | Status: DC | PRN
  Administered 2024-05-28 – 2024-05-29 (×4): 650 mg via ORAL
  Filled 2024-05-27 (×4): qty 2

## 2024-05-27 MED ORDER — IPRATROPIUM-ALBUTEROL 0.5-2.5 (3) MG/3ML IN SOLN
3.0000 mL | Freq: Four times a day (QID) | RESPIRATORY_TRACT | Status: DC | PRN
  Administered 2024-05-28 – 2024-05-29 (×3): 3 mL via RESPIRATORY_TRACT
  Filled 2024-05-27 (×6): qty 3

## 2024-05-27 MED ORDER — ENOXAPARIN SODIUM 100 MG/ML IJ SOSY
100.0000 mg | PREFILLED_SYRINGE | Freq: Two times a day (BID) | INTRAMUSCULAR | Status: DC
  Administered 2024-05-27 – 2024-05-28 (×2): 100 mg via SUBCUTANEOUS
  Filled 2024-05-27 (×3): qty 1

## 2024-05-27 MED ORDER — FUROSEMIDE 10 MG/ML IJ SOLN
40.0000 mg | Freq: Two times a day (BID) | INTRAMUSCULAR | Status: DC
  Administered 2024-05-27 – 2024-05-28 (×3): 40 mg via INTRAVENOUS
  Filled 2024-05-27 (×4): qty 4

## 2024-05-27 MED ORDER — MORPHINE SULFATE (PF) 4 MG/ML IV SOLN
4.0000 mg | Freq: Once | INTRAVENOUS | Status: AC
  Administered 2024-05-27: 4 mg via INTRAVENOUS
  Filled 2024-05-27: qty 1

## 2024-05-27 MED ORDER — PRENATAL MULTIVITAMIN CH
1.0000 | ORAL_TABLET | Freq: Every day | ORAL | Status: DC
  Administered 2024-05-28 – 2024-05-29 (×2): 1 via ORAL
  Filled 2024-05-27 (×2): qty 1

## 2024-05-27 MED ORDER — ALBUTEROL SULFATE (2.5 MG/3ML) 0.083% IN NEBU: 2.5000 mg | INHALATION_SOLUTION | Freq: Four times a day (QID) | RESPIRATORY_TRACT | Status: DC | PRN

## 2024-05-27 MED ORDER — OXYCODONE HCL 5 MG PO TABS
5.0000 mg | ORAL_TABLET | ORAL | Status: DC | PRN
  Administered 2024-05-27 – 2024-05-28 (×3): 5 mg via ORAL
  Filled 2024-05-27 (×4): qty 1

## 2024-05-27 MED ORDER — FUROSEMIDE 10 MG/ML IJ SOLN
40.0000 mg | Freq: Once | INTRAMUSCULAR | Status: AC
  Administered 2024-05-27: 40 mg via INTRAVENOUS
  Filled 2024-05-27: qty 4

## 2024-05-27 MED ORDER — CALCIUM CARBONATE ANTACID 500 MG PO CHEW: 2.0000 | CHEWABLE_TABLET | ORAL | Status: DC | PRN

## 2024-05-27 MED ORDER — ASPIRIN 81 MG PO CHEW
162.0000 mg | CHEWABLE_TABLET | Freq: Every day | ORAL | Status: DC
  Administered 2024-05-27 – 2024-05-29 (×3): 162 mg via ORAL
  Filled 2024-05-27 (×3): qty 2

## 2024-05-27 MED ORDER — LACTATED RINGERS IV SOLN: 125.0000 mL/h | INTRAVENOUS | Status: DC

## 2024-05-27 MED ORDER — LABETALOL HCL 200 MG PO TABS
200.0000 mg | ORAL_TABLET | Freq: Two times a day (BID) | ORAL | Status: DC
  Administered 2024-05-27 – 2024-05-29 (×4): 200 mg via ORAL
  Filled 2024-05-27 (×4): qty 1

## 2024-05-27 NOTE — MAU Provider Note (Signed)
 History     CSN: [id number]  Arrival date and time: 05/27/24 1510   Event Date/Time   First Provider Initiated Contact with Patient 05/27/24 1526 (met patient in room)    Chief Complaint  Patient presents with   Shortness of Breath   Shortness of Breath  Brooke Sandoval is a 38 y.o. 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 She was given duonebs and prednisone  burst which greatly improved her symptoms.   Starting two days ago she noted increased SOB. She reports now using 4 pillows to prop up at night. She reports waking up out of sleep gasping and feeling like I am drowning. She reports cough all the time which makes her hernia and her hemorrhoid hurt.   She notes swelling in her right lower ankle calf this past week. Sudden with swelling at ankle and then a knot near the bone. She reports taking her lovenox  and an extra aspirin  and it improved but has since had aching behind her right knee.   Notes bilateral leg swelling this is worse.     OB History     Gravida  9   Para  7   Term      Preterm  7   AB  1   Living  5      SAB  1   IAB      Ectopic      Multiple      Live Births  5        Obstetric Comments  Hx GDM, pre-E, clotting disorder, two fetal demise due to blood clot in placenta per pt.  Past deliveries at [facility] and in [place] .         Past Medical History:  Diagnosis Date   ADHD (attention deficit hyperactivity disorder)    Anxiety    Attention deficit hyperactivity disorder (ADHD), 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  Prescribed by [facility] in [place].  She DC'd with + UPT but would like to restart.  Counseled not recommended in pregnancy.   Counseled to establish with [facility] ASAP as this clinic will not prescribe those types of meds.  Verbal and written information provided today.  Will discuss with [facility] at [facility] .     If symptoms not well controlled with counseling/behavioral therapy, re   CHF (congestive heart failure) (HCC)    after admission for vetnal hernia   Coagulation factor disorder    DVT (deep venous thrombosis) (HCC)    History of chlamydia infection 09/12/2014   3/19 Called in Azithromycin Rx for positive chlamydia.   4/28:  TOC NEG         02/25/2015  TOC neg 8/10     History of gestational diabetes    History of pre-eclampsia    Miscarriage    Poor dentition 10/10/2014   Records note poor dentition in prior records. Please ensure she is getting regular dental care.     Poor intravenous access 12/09/2014   In last pregnancy, required port access.      Past Surgical History:  Procedure Laterality Date   APPENDECTOMY     CESAREAN SECTION     TONSILLECTOMY  VENTRAL HERNIA REPAIR      Family History  Problem Relation Age of Onset   Depression Mother    COPD Mother    Diabetes Mother    Schizophrenia Father    Drug abuse Sister     Social History   Tobacco Use   Smoking status: Never    Passive exposure: Never   Smokeless tobacco: Never  Vaping Use   Vaping status: Never Used  Substance Use Topics   Alcohol use: Not Currently    Comment: socially before pregnancy   Drug use: No    Allergies:  Allergies  Allergen Reactions   Prednisone  Swelling    Medications Prior to Admission  Medication Sig Dispense Refill Last Dose/Taking   albuterol  (VENTOLIN  HFA) 108 (90 Base) MCG/ACT inhaler Inhale 1-2 puffs into the lungs every 6 (six) hours as needed for wheezing or shortness of breath. 6.7 g 1 05/27/2024 at  3:30 PM   aspirin  (ASPIRIN  CHILDRENS) 81 MG chewable tablet Chew 2 tablets (162 mg total) by mouth daily. 60 tablet 11 05/26/2024   budesonide -formoterol  (SYMBICORT ) 160-4.5 MCG/ACT inhaler Inhale  2 puffs into the lungs 2 (two) times daily. 10.2 g 12 05/27/2024   docusate sodium  (COLACE) 100 MG capsule Take 1 capsule (100 mg total) by mouth 2 (two) times daily. 60 capsule 2 Past Week   enoxaparin  (LOVENOX ) 100 MG/ML injection Inject 1 mL (100 mg total) into the skin every 12 (twelve) hours. 60 mL 11 05/26/2024   furosemide  (LASIX ) 40 MG tablet Take 1 tablet (40 mg total) by mouth daily. 30 tablet 11 05/26/2024   ipratropium-albuterol  (DUONEB) 0.5-2.5 (3) MG/3ML SOLN Take 3 mLs by nebulization every 6 (six) hours as needed. 360 mL 0 05/27/2024   Iron , Ferrous Sulfate , 325 (65 Fe) MG TABS Take 1 tablet by mouth every other day. 30 tablet 3 Past Week   labetalol  (NORMODYNE ) 200 MG tablet Take 1 tablet (200 mg total) by mouth 2 (two) times daily. 60 tablet 0 05/27/2024   ondansetron  (ZOFRAN -ODT) 4 MG disintegrating tablet Take 1 tablet (4 mg total) by mouth every 8 (eight) hours as needed for nausea or vomiting. 30 tablet 2 05/27/2024   oxyCODONE  (OXY IR/ROXICODONE ) 5 MG immediate release tablet Take 1 tablet (5 mg total) by mouth every 4 (four) hours as needed (breakthrough pain). 10 tablet 0 Past Month   Prenatal Vit-Fe Fumarate-FA (MULTIVITAMIN-PRENATAL) 27-0.8 MG TABS tablet Take 1 tablet by mouth daily at 12 noon.   05/26/2024   Blood Pressure Monitoring DEVI 1 each by Does not apply route once a week. (Patient not taking: Reported on 05/15/2024) 1 each 0    ondansetron  (ZOFRAN -ODT) 4 MG disintegrating tablet Take 1 tablet (4 mg total) by mouth every 8 (eight) hours as needed for nausea vomitting 90 tablet 0    promethazine  (PHENERGAN ) 6.25 MG/5ML solution Take 5 mLs (6.25 mg total) by mouth 4 (four) times daily as needed for nausea or vomiting. 120 mL 1     Review of Systems  Respiratory:  Positive for shortness of breath.    Physical Exam   Height 5' 7 (1.702 m), weight (!) 178 kg, SpO2 95%.  Physical Exam Vitals and nursing note reviewed.  Constitutional:      General: She is not in  acute distress.    Appearance: She is well-developed.     Comments: Pregnant female  HENT:     Head: Normocephalic and atraumatic.  Eyes:     General: No scleral icterus.  Conjunctiva/sclera: Conjunctivae normal.  Cardiovascular:     Rate and Rhythm: Normal rate.  Pulmonary:     Effort: Pulmonary effort is normal. Tachypnea present.     Breath sounds: Examination of the right-upper field reveals wheezing. Examination of the left-upper field reveals wheezing. Examination of the right-middle field reveals wheezing and rales. Examination of the left-middle field reveals wheezing and rales. Examination of the right-lower field reveals wheezing and rales. Examination of the left-lower field reveals wheezing and rales. Wheezing and rales present.  Chest:     Chest wall: No tenderness.  Abdominal:     Palpations: Abdomen is soft.     Tenderness: There is no abdominal tenderness. There is no guarding or rebound.     Comments: Gravid  Genitourinary:    Vagina: Normal.  Musculoskeletal:        General: Normal range of motion.     Cervical back: Normal range of motion and neck supple.  Skin:    General: Skin is warm and dry.     Findings: No rash.  Neurological:     Mental Status: She is alert and oriented to person, place, and time.     MAU Course  Procedures  MDM-- HIGH  See Admit H&P   Assessment and Plan    Suzen Maryan Masters 05/27/2024, 4:43 PM

## 2024-05-27 NOTE — H&P (Signed)
 FACULTY PRACTICE ANTEPARTUM ADMISSION HISTORY AND PHYSICAL NOTE   History of Present Illness:  Nerea Bordenave is a 38 y.o. 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 She was given duonebs and prednisone  burst which greatly improved her symptoms.   Starting two days ago she noted increased SOB. She reports now using 4 pillows to prop up at night. She reports waking up out of sleep gasping and feeling like I am drowning. She reports cough all the time which makes her hernia and her hemorrhoid hurt.   She notes swelling in her right lower ankle calf this past week. Sudden with swelling at ankle and then a knot near the bone. She reports taking her lovenox  and an extra aspirin  and it improved but has since had aching behind her right knee.   Notes bilateral leg swelling this is worse.     CHF exacerbation vs Asthma exacerbation.   Patient reports the fetal movement as active. Patient reports uterine contraction  activity as none. Patient reports  vaginal bleeding as none. Patient describes fluid per vagina as None. Fetal presentation is transverse, placenta anterior.  Patient Active Problem List   Diagnosis Date Noted   Acute congestive heart failure (HCC) 05/27/2024   Hepatic steatosis during pregnancy 03/28/2024   Low fetal fraction on NIPS 03/20/2024   Advanced maternal age in multigravida 03/20/2024   RED chart patient 02/21/2024   At risk for injury related to surgical procedure, history of bowel injury 02/21/2024   Placenta positioned low in anterior wall of uterus 02/21/2024   Supervision of high risk pregnancy, antepartum 02/06/2024   History of congestive heart failure 01/22/2024   history of 5 c sections  12/27/2021   Ventral hernia without obstruction or gangrene 12/27/2021   History of substance abuse (HCC) 03/03/2015   History of gestational diabetes 02/16/2015   Asthma affecting pregnancy, antepartum 12/09/2014   Chronic hypertension during pregnancy, antepartum 12/09/2014   Anxiety 10/08/2014   Previous cesarean delivery affecting pregnancy, antepartum 10/08/2014   Hx of pulmonary embolus during pregnancy 10/08/2014   Protein S deficiency affecting pregnancy, antepartum 10/08/2014   BMI 50.0-59.9, adult (HCC) 08/28/2014   History of pre-eclampsia in prior pregnancy, currently pregnant in first trimester 08/28/2014    Past Medical History:  Diagnosis Date   ADHD (attention deficit hyperactivity disorder)    Anxiety    Attention deficit hyperactivity disorder (ADHD), 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  Prescribed by [facility] in [place].  She DC'd with + UPT but would like to restart.  Counseled not recommended in pregnancy.  Counseled to establish with [facility] ASAP as this clinic will not prescribe those types of meds.  Verbal and written information provided today.  Will discuss with MFM at [facility] .     If symptoms not well controlled with counseling/behavioral therapy, re   CHF (congestive heart failure) (HCC)    after admission for vetnal hernia   Coagulation factor disorder    DVT (deep venous thrombosis) (HCC)    History of chlamydia infection 09/12/2014   3/19 Called in Azithromycin Rx for positive chlamydia.   4/28:  TOC NEG         02/25/2015  TOC neg 8/10  History of gestational diabetes    History of pre-eclampsia    Miscarriage    Poor dentition 10/10/2014   Records note poor dentition in prior records. Please ensure she is getting regular dental care.     Poor intravenous access 12/09/2014   In last pregnancy, required port access.      Past Surgical History:  Procedure Laterality Date   APPENDECTOMY     CESAREAN SECTION     TONSILLECTOMY      VENTRAL HERNIA REPAIR      OB History  Gravida Para Term Preterm AB Living  9 7  7 1 5   SAB IAB Ectopic Multiple Live Births  1    5    # Outcome Date GA Lbr Len/2nd Weight Sex Type Anes PTL Lv  9 Current           8 Preterm 04/06/15 109w0d   M CS-LTranv   LIV  7 SAB 2014 [redacted]w[redacted]d         6 Preterm 04/14/11 [redacted]w[redacted]d    CS-LTranv   LIV  5 Preterm 02/2010 [redacted]w[redacted]d   M Vag-Spont   FD  4 Preterm 09/23/07 110w0d   F CS-LTranv   LIV  3 Preterm 07/26/06 [redacted]w[redacted]d   M CS-LTranv   LIV  2 Preterm 2007 [redacted]w[redacted]d    Vag-Spont   FD     Complications: Clotting disorder  1 Preterm 09/15/03 [redacted]w[redacted]d   M CS-LTranv   LIV    Obstetric Comments  Hx GDM, pre-E, clotting disorder, two fetal demise due to blood clot in placenta per pt.  Past deliveries at [facility] and in [place] .    Social History   Socioeconomic History   Marital status: Single    Spouse name: Not on file   Number of children: Not on file   Years of education: Not on file   Highest education level: Not on file  Occupational History   Not on file  Tobacco Use   Smoking status: Never    Passive exposure: Never   Smokeless tobacco: Never  Vaping Use   Vaping status: Never Used  Substance and Sexual Activity   Alcohol use: Not Currently    Comment: socially before pregnancy   Drug use: No   Sexual activity: Not Currently    Comment: 1 mon ago  Other Topics Concern   Not on file  Social History Narrative   Not on file   Social Drivers of Health   Financial Resource Strain: Not on file  Food Insecurity: No Food Insecurity (05/15/2024)   Hunger Vital Sign    Worried About Running Out of Food in the Last Year: Never true    Ran Out of Food in the Last Year: Never true  Transportation Needs: Unmet Transportation Needs (05/15/2024)   PRAPARE - Administrator, Civil Service (Medical): Yes    Lack of Transportation (Non-Medical): Yes  Physical Activity: Not on file  Stress: Not on file  Social Connections: Not on file     Family History  Problem Relation Age of Onset   Depression Mother    COPD Mother    Diabetes Mother    Schizophrenia Father    Drug abuse Sister     Allergies  Allergen Reactions   Prednisone  Swelling    Medications Prior to Admission  Medication Sig Dispense Refill Last Dose/Taking   albuterol  (VENTOLIN  HFA) 108 (90 Base) MCG/ACT inhaler Inhale 1-2 puffs into the lungs every 6 (six) hours  as needed for wheezing or shortness of breath. 6.7 g 1 05/27/2024 at  3:30 PM   aspirin  (ASPIRIN  CHILDRENS) 81 MG chewable tablet Chew 2 tablets (162 mg total) by mouth daily. 60 tablet 11 05/26/2024   budesonide -formoterol  (SYMBICORT ) 160-4.5 MCG/ACT inhaler Inhale 2 puffs into the lungs 2 (two) times daily. 10.2 g 12 05/27/2024   docusate sodium  (COLACE) 100 MG capsule Take 1 capsule (100 mg total) by mouth 2 (two) times daily. 60 capsule 2 Past Week   enoxaparin  (LOVENOX ) 100 MG/ML injection Inject 1 mL (100 mg total) into the skin every 12 (twelve) hours. 60 mL 11 05/26/2024   furosemide  (LASIX ) 40 MG tablet Take 1 tablet (40 mg total) by mouth daily. 30 tablet 11 05/26/2024   ipratropium-albuterol  (DUONEB) 0.5-2.5 (3) MG/3ML SOLN Take 3 mLs by nebulization every 6 (six) hours as needed. 360 mL 0 05/27/2024   Iron , Ferrous Sulfate , 325 (65 Fe) MG TABS Take 1 tablet by mouth every other day. 30 tablet 3 Past Week   labetalol  (NORMODYNE ) 200 MG tablet Take 1 tablet (200 mg total) by mouth 2 (two) times daily. 60 tablet 0 05/27/2024   ondansetron  (ZOFRAN -ODT) 4 MG disintegrating tablet Take 1 tablet (4 mg total) by mouth every 8 (eight) hours as needed for nausea or vomiting. 30 tablet 2 05/27/2024   oxyCODONE  (OXY IR/ROXICODONE ) 5 MG immediate release tablet Take 1 tablet (5 mg total) by mouth every 4 (four) hours as needed (breakthrough pain). 10 tablet 0 Past Month   Prenatal Vit-Fe Fumarate-FA (MULTIVITAMIN-PRENATAL) 27-0.8 MG TABS tablet Take 1 tablet by mouth daily at 12 noon.   05/26/2024   Blood  Pressure Monitoring DEVI 1 each by Does not apply route once a week. (Patient not taking: Reported on 05/15/2024) 1 each 0    ondansetron  (ZOFRAN -ODT) 4 MG disintegrating tablet Take 1 tablet (4 mg total) by mouth every 8 (eight) hours as needed for nausea vomitting 90 tablet 0    promethazine  (PHENERGAN ) 6.25 MG/5ML solution Take 5 mLs (6.25 mg total) by mouth 4 (four) times daily as needed for nausea or vomiting. 120 mL 1     Review of Systems - Negative except SOB, COUGH, Orthopnea and Paroxsymal dyspnea  Vitals:  BP (!) 146/82   Pulse (!) 105   Ht 5' 7 (1.702 m)   Wt (!) 178 kg   LMP  (LMP Unknown)   SpO2 96%   BMI 61.46 kg/m  Physical Examination: CONSTITUTIONAL: Breathless. Talking in incomplete sentences. Well-developed, well-nourished female in no acute distress.  HENT:  Normocephalic, atraumatic, External right and left ear normal. Oropharynx is clear and moist EYES: Conjunctivae and EOM are normal. Pupils are equal, round, and reactive to light. No scleral icterus.  NECK: Normal range of motion, supple, no masses SKIN: Skin is warm and dry. No rash noted. Not diaphoretic. No erythema. No pallor. NEUROLGIC: Alert and oriented to person, place, and time. Normal reflexes, muscle tone coordination. No cranial nerve deficit noted. PSYCHIATRIC: Normal mood and affect. Normal behavior. Normal judgment and thought content. CARDIOVASCULAR: Normal heart rate noted, regular rhythm  RESPIRATORY:  Initial exam in MAU: Increased WOB, sitting up right and talking in incomplete sentences. + rales most in lower lung fields. Wheezing throughout.  Repeat exam after Lasix  40 IV: improved WOB. Continues to have cough and some rales but improved  ABDOMEN: Soft, nontender, nondistended, gravid. MUSCULOSKELETAL: Normal range of motion. No edema and no tenderness. 2+ distal pulses.  Unable to monitor due to maternal  habitus and fetal size/hernia.  Labs:  Results for orders placed or performed  during the hospital encounter of 05/27/24 (from the past 24 hours)  Comprehensive metabolic panel   Collection Time: 05/27/24  4:18 PM  Result Value Ref Range   Sodium 135 135 - 145 mmol/L   Potassium 4.1 3.5 - 5.1 mmol/L   Chloride 102 98 - 111 mmol/L   CO2 24 22 - 32 mmol/L   Glucose, Bld 144 (H) 70 - 99 mg/dL   BUN 12 6 - 20 mg/dL   Creatinine, Ser 9.32 0.44 - 1.00 mg/dL   Calcium  8.6 (L) 8.9 - 10.3 mg/dL   Total Protein 6.0 (L) 6.5 - 8.1 g/dL   Albumin 2.3 (L) 3.5 - 5.0 g/dL   AST 15 15 - 41 U/L   ALT 13 0 - 44 U/L   Alkaline Phosphatase 57 38 - 126 U/L   Total Bilirubin 0.2 0.0 - 1.2 mg/dL   GFR, Estimated >39 >39 mL/min   Anion gap 9 5 - 15  CBC   Collection Time: 05/27/24  4:18 PM  Result Value Ref Range   WBC 11.5 (H) 4.0 - 10.5 K/uL   RBC 3.73 (L) 3.87 - 5.11 MIL/uL   Hemoglobin 10.2 (L) 12.0 - 15.0 g/dL   HCT 67.7 (L) 63.9 - 53.9 %   MCV 86.3 80.0 - 100.0 fL   MCH 27.3 26.0 - 34.0 pg   MCHC 31.7 30.0 - 36.0 g/dL   RDW 82.9 (H) 88.4 - 84.4 %   Platelets 239 150 - 400 K/uL   nRBC 0.0 0.0 - 0.2 %  Brain natriuretic peptide   Collection Time: 05/27/24  4:18 PM  Result Value Ref Range   B Natriuretic Peptide 25.2 0.0 - 100.0 pg/mL  Troponin I (High Sensitivity)   Collection Time: 05/27/24  4:18 PM  Result Value Ref Range   Troponin I (High Sensitivity) 6 <18 ng/L    Imaging Studies: US  MFM OB FOLLOW UP Result Date: 05/27/2024 ----------------------------------------------------------------------  OBSTETRICS REPORT                       (Signed Final 05/27/2024 02:43 pm) ---------------------------------------------------------------------- Patient Info  ID #:       [id number]                          D.O.B.:  [birth year] (38 yrs)(F)  Name:       HARLENE Maves                  Visit Date: 05/27/2024 02:04 pm ---------------------------------------------------------------------- Performed By  Attending:        Nathanel Fetters      Ref. Address:     [address]                     [place]                                                             [place], [place]  [address]  Performed By:     Meade Parker       [facility]  Referred By:      [facility] ---------------------------------------------------------------------- Orders  #  Description                           Code        Ordered By  1  US  MFM OB FOLLOW UP                   [phone]    BABARA KEYS ----------------------------------------------------------------------  #  Order #                     Accession #                Episode #  1  [id number]                   [id number]                 [id number] ---------------------------------------------------------------------- Indications  [redacted] weeks gestation of pregnancy                Z3A.25  Abdominal pain in pregnancy                    O99.89  Obesity complicating pregnancy, second         O99.212  trimester (BMI 76)  Advanced maternal age multigravida 38+,        O26.522  second trimester (38 yrs)  Previous cesarean delivery, antepartum (x5)    O26.219  Grand multiparity, antepartum                  O09.40  Poor obstetric history: Previous preterm       O09.219  delivery, antepartum (No term deliveries)  Pre-existing essential hypertension            O10.012  complicating pregnancy, second trimester  Medical complication of pregnancy (Hepatic     O26.90  steatosis, ventral hernia)(Hx of DVT, SUD,  CHF [Lasix ], bowel injury)  Poor obstetric history: Previous  O09.299  preeclampsia, GDM, PE (on Lovenox ),  HELLP  Protein S deficiency complicating pregnancy    O99.199  Poor obstetric history: Previous IUFD          O46.299  (stillbirth) x2, 24w  2007, 24w 2011)  Abnormal chromosomal and genetic finding       O28.5  on antenatal screening of mother (Low fetal  fraction on Panorama, LOW RISK [facility]  NIPS)  Encounter for other antenatal screening        Z36.2  follow-up  Obesity complicating pregnancy, second         O99.212  trimester (BMI 57)  Encounter for other antenatal screening        Z36.2  follow-up ---------------------------------------------------------------------- Fetal Evaluation  Num Of Fetuses:         1  Cardiac Activity:       Observed  Presentation:           Transverse, head to maternal left  Placenta:               Anterior  P. Cord Insertion:      Previously seen  Amniotic Fluid  AFI FV:      Within normal limits                              Largest Pocket(cm)                              5.28 ---------------------------------------------------------------------- Biometry  BPD:      66.2  mm     G. Age:  26w 5d         82  %    CI:        80.28   %    70 - 86                                                          FL/HC:      20.3   %    18.7 - 20.3  HC:      233.4  mm     G. Age:  25w 3d         25  %    HC/AC:      1.09        1.04 - 1.22  AC:      214.3  mm     G. Age:  26w 0d         57  %    FL/BPD:     71.6   %    71 - 87  FL:       47.4  mm     G. Age:  25w 6d         49  %    FL/AC:      22.1   %    20 - 24  CER:      26.6  mm     G. Age:  23w 6d         14  %  LV:        4.8  mm  CM:        4.9  mm  Est. FW:  865  gm    1 lb 15 oz      60  % ---------------------------------------------------------------------- OB History  Gravidity:    9         Term:   0        Prem:   7        SAB:   1  TOP:          0       Ectopic:  0        Living: 5 ---------------------------------------------------------------------- Gestational Age  U/S Today:     26w 0d                                        EDD:   09/02/24  Best:          25w 3d     Det. By:  Early Ultrasound         EDD:   09/06/24                                      (01/22/24)  ---------------------------------------------------------------------- Targeted Anatomy  Central Nervous System  Calvarium/Cranial V.:  Appears normal         Cereb./Vermis:          Not well visualized  Cavum:                 Appears normal         Cisterna Magna:         Not well visualized  Lateral Ventricles:    Appears normal         Midline Falx:           Appears normal  Choroid Plexus:        Not well visualized  Spine  Cervical:              Not well visualized    Sacral:                 Not well visualized  Thoracic:              Not well visualized    Shape/Curvature:        Not well visualized  Lumbar:                Not well visualized  Head/Neck  Lips:                  Not well visualized    Profile:                Appears normal  Neck:                  Not well visualized    Orbits/Eyes:            Lenses nwv  Nuchal Fold:           Not applicable         Mandible:               Not well visualized  Nasal Bone:            Present                Maxilla:  Not well visualized  Thorax  4 Chamber View:        Appears normal         Interventr. Septum:     Appears normal  Cardiac Rhythm:        Normal                 Cardiac Axis:           Appears normal  Cardiac Situs:         Appears normal         Diaphragm:              Appears normal  Rt Outflow Tract:      Not well visualized    3 Vessel View:          Appears normal  Lt Outflow Tract:      Appears normal         3 V Trachea View:       Not well visualized  Aortic Arch:           Not well visualized    IVC:                    Appears normal  Ductal Arch:           Previously seen        Crossing:               Appears normal  SVC:                   Appears normal  Abdomen  Ventral Wall:          Appears normal         Lt Kidney:              Not well visualized  Cord Insertion:        Appears normal         Rt Kidney:              Not well visualized  Situs:                 Appears normal         Bladder:                Previously seen   Stomach:               Appears normal  Extremities  Lt Humerus:            Previously seen        Lt Femur:               Previously seen  Rt Humerus:            Previously seen        Rt Femur:               Previously seen  Lt Forearm:            Previously seen        Lt Lower Leg:           Previously seen  Rt Forearm:            Previously seen        Rt Lower Leg:           Previously seen  Lt Hand:               Previously seen  Lt Foot:                limited lateral view  Rt Hand:               Not well visualized    Rt Foot:                Not well visualized  Other  Umbilical Cord:        Previously seen        Genitalia:              Female prev seen  Comment:     Technically difficult due to hernia in maternal RLQ, BMI 57, and               fetal position. ---------------------------------------------------------------------- Cervix Uterus Adnexa  Cervix  Not visualized (advanced GA >24wks)  Uterus  No abnormality visualized.  Right Ovary  Not visualized.  Left Ovary  Not visualized.  Cul De Sac  No free fluid seen.  Adnexa  No abnormality visualized ---------------------------------------------------------------------- Impression  MFM Consultation  Ms. Morrow is a 38 yo G9P5 at 24 w 3 d with an EDD of  09/06/24.  She is here for follow up growth exam.  She has multiple issue complicating her pregnancy  Most notably  CHTN  BMI 50  CHF managed with Lasix   H/o DVT on Lovenox .  She denies s/sx of PTL or Preeclampsia however, she  complains of worsening SOB and coughing at night when she  lays down. She says she cannot sleep at night either.  She has a f/u maternal echo today and scheduled to see  cardiology on Friday.           05/27/2024    1:17 PM       05/15/2024    9:30 PM  PM       05/15/2024    5:49 PM  Vitals with BMI  Systolic 136      124      145  Diastolic92       69       82  Pulse             89       95  Imaging:  SIUP with measurements consistent with dates  Good fetal movement and  amniotic fluid again observed  Suboptimal views of the fetal anatomy was again seen  secondary to fetal position and maternal habitus.  Impression/Counseling:  1) CHF- I discussed with Ms Segall her symptoms and  recommended she go to MAU for further evaluation after her  echo (they will likely want to get one anyway).  May need additional Lasix . Will see after echocardiogram.  Consider consulting cards if admitted.  BNP on 11/20 was 20.  2) CHTN-BP stable on Labetalol   3) h/o PE on Lovenox .  -Needs AntiXa assessment again.  Repeat growth scheduled in 3-4 weeks.  I spent 30 minutes with > 50% in face to face consultation  Nathanel DOROTHA Fetters, MD ----------------------------------------------------------------------              Nathanel Fetters, MD Electronically Signed Final Report   05/27/2024 02:43 pm ----------------------------------------------------------------------   US  MFM OB LIMITED Result Date: 05/16/2024 ----------------------------------------------------------------------  OBSTETRICS REPORT                        (Signed Final 05/16/2024 06:19 am) ---------------------------------------------------------------------- Patient Info  ID #:       [id number]  D.O.B.:  August 04, 1985 (38 yrs)(F)  Name:       Jashay Picchi                  Visit Date: 05/15/2024 07:37 pm ---------------------------------------------------------------------- Performed By  Attending:        Steffan Keys MD         Ref. Address:      [address]                                                              [place], [place]                                                              [address]  Performed By:     Jonette Nap        [facility]  Referred By:      [facility] ---------------------------------------------------------------------- Orders  #  Description                            Code        Ordered By  1  US  MFM OB LIMITED                     23184.98    GLENYS BIRK ----------------------------------------------------------------------  #  Order #                     Accession #                Episode #  1  [id number]                   [id number]                 [id number] ---------------------------------------------------------------------- Indications  Unable to hear fetal heart tones as reason      O76  for ultrasound  Abdominal pain in pregnancy                     O99.89  Obesity complicating pregnancy, second          O99.212  trimester (BMI 57)  Advanced maternal age multigravida 13+,         O60.522  second trimester (38 yrs)  Previous cesarean delivery, antepartum (x5)     O61.219  Grand multiparity, antepartum                   O09.40  Poor obstetric history: Previous preterm  O09.219  delivery, antepartum (No term deliveries)  Pre-existing essential hypertension             O10.012  complicating pregnancy, second trimester  Medical complication of pregnancy (Hepatic      O26.90  steatosis, ventral hernia)(Hx of DVT, SUD,  CHF [Lasix ], bowel injury)  Poor obstetric history: Previous                O09.299  preeclampsia, GDM, PE (on Lovenox ),  HELLP  Protein S deficiency complicating pregnancy     O99.199  Poor obstetric history: Previous IUFD           O18.299  (stillbirth) x2, 24w 2007, 24w 2011)  [redacted] weeks gestation of pregnancy                 Z3A.23  Abnormal chromosomal and genetic finding        O28.5  on antenatal screening of mother (Low fetal  fraction on Panorama, LOW RISK [facility]  NIPS)  LR [facility] NIPS - Female  Encounter for other antenatal screening         Z36.2  follow-up ---------------------------------------------------------------------- Fetal Evaluation  Num Of Fetuses:          1  Fetal Heart Rate(bpm):   144  Cardiac Activity:        Observed  Presentation:            Transverse, head to maternal right  Placenta:                Anterior  Amniotic  Fluid  AFI FV:      Within normal limits                              Largest Pocket(cm)                              3.7 ---------------------------------------------------------------------- OB History  Gravidity:    9         Term:   0        Prem:   7        SAB:   1  TOP:          0       Ectopic:  0        Living: 5 ---------------------------------------------------------------------- Gestational Age  Best:          23w 5d     Det. By:  Early Ultrasound         EDD:   09/06/24                                      (01/22/24) ---------------------------------------------------------------------- Anatomy  Heart:                 Visualized             Bladder:                Appears normal  Stomach:               Appears normal, left                         sided ---------------------------------------------------------------------- Comments  This patient presented to the MAU due to abdominal pain and  shortness  of breath. The fetal heart tones could not be found  in the MAU due to maternal body habitus.  A limited ultrasound performed today shows that the fetus is  in the transverse presentation.  There was normal amniotic fluid noted.  The fetal heart rate was 144 beats a minute. ----------------------------------------------------------------------                   Steffan Keys, MD Electronically Signed Final Report   05/16/2024 06:19 am ----------------------------------------------------------------------   DG Abd Portable 1 View Result Date: 05/15/2024 CLINICAL DATA:  Abdominal pain. EXAM: PORTABLE ABDOMEN - 1 VIEW COMPARISON:  None Available. FINDINGS: The bowel gas pattern is normal. No radio-opaque calculi or other significant radiographic abnormality are seen. IMPRESSION: Negative. Electronically Signed   By: Vanetta Chou M.D.   On: 05/15/2024 19:09   DG Chest Port 1V same Day Result Date: 05/15/2024 CLINICAL DATA:  Shortness of breath EXAM: PORTABLE CHEST 1 VIEW COMPARISON:  Chest x-ray  12/17/2023 FINDINGS: The heart size and mediastinal contours are within normal limits. Both lungs are clear. The visualized skeletal structures are unremarkable. IMPRESSION: No active disease. Electronically Signed   By: Greig Pique M.D.   On: 05/15/2024 19:07     Assessment and Plan: Patient Active Problem List   Diagnosis Date Noted   Acute congestive heart failure (HCC) 05/27/2024   Hepatic steatosis during pregnancy 03/28/2024   Low fetal fraction on NIPS 03/20/2024   Advanced maternal age in multigravida 03/20/2024   RED chart patient 02/21/2024   At risk for injury related to surgical procedure, history of bowel injury 02/21/2024   Placenta positioned low in anterior wall of uterus 02/21/2024   Supervision of high risk pregnancy, antepartum 02/06/2024   History of congestive heart failure 01/22/2024   history of 5 c sections 12/27/2021   Ventral hernia without obstruction or gangrene 12/27/2021   History of substance abuse (HCC) 03/03/2015   History of gestational diabetes 02/16/2015   Asthma affecting pregnancy, antepartum 12/09/2014   Chronic hypertension during pregnancy, antepartum 12/09/2014   Anxiety 10/08/2014   Previous cesarean delivery affecting pregnancy, antepartum 10/08/2014   Hx of pulmonary embolus during pregnancy 10/08/2014   Protein S deficiency affecting pregnancy, antepartum 10/08/2014   BMI 50.0-59.9, adult (HCC) 08/28/2014   History of pre-eclampsia in prior pregnancy, currently pregnant in first trimester 08/28/2014   Admit to Antenatal  1. Acute congestive heart failure, unspecified heart failure type (HCC)   2. Shortness of breath   3. [redacted] weeks gestation of pregnancy      #Acute on Chronic CHF, unknown current EF.  - Home Lasix --40mg  po BID - Lasix  IV 40 mg BID - Daily BMP - Strict I&O - Echo in AM  - Dr. Dub Tobb following and involved in care  #Asthma -Duonebs q 6 pRN - Continue home ICS inhaler   #Leg Pain: on lovenox , known  protein S def and history of DVT PE - Took lovenox  yesterday - PVL of right leg ordered due to history and report of pain.  - Continue home lovenox   #cHTN - Mild range BPs - Continue home medications  #FWB: Currently 865g 60th%. Record fetal movement. Doppler q shift. Neonatology called and aware of admission. Explained that delivery unlikely.  Will give BMZ if delivery is considered  Diet: Heart Healthy Dispo: unsure, will need to have diuresis and echo to help determine stability for discharge   Suzen Maryan Masters, MD, MPH, ABFM, [facility] Attending Physician Center for [facility]  Care

## 2024-05-27 NOTE — Progress Notes (Signed)
 Chart review: Patient appear to be clinically in heart failure has been started on Lasix . Stat echo ordered, but at this time there is another hemodynamically unstable patient in the Cath Lab that needs echo attention Current vitals:  05/27/24 1700 -- 108 Abnormal  -- -- 139/96 Abnormal  -- 97 % --   Given patient is hemodynamically stable and is on Lasix  we will perform her echocardiogram routinely.

## 2024-05-27 NOTE — Telephone Encounter (Signed)
 Patient NO SHOWED OB ECHO 05/27/24. We will not call to reschedule patient due to NO SHOW RATE OF 15%. If patient calls back to reschedule we will do so. Thank you.

## 2024-05-27 NOTE — MAU Note (Signed)
.  Brooke Sandoval is a 38 y.o. at [redacted]w[redacted]d here in MAU reporting:  SOB since 2 weeks ago but recently became worse to the point pt uses multiple pillows to sleep and is unable to lay down without struggling to breath. Pt appears to be tachypnic.Denies chest pain. Reports HA without vision changes. Denies LOF, VB, and CTX. Reports +FM. Sent from MFM for difficulty breathing, was unable to get her ECHO done today. Adds she awoke in the middle of the night this week with severe pain in R calf and expresses her concern of DVT due to her hx and calf still sore. LMP: na Onset of complaint: 2 days ago Pain score: 8/10 - ABD pain There were no vitals filed for this visit.   FHT: pt unable to lay back for EFM; declines fetal monitoring at this time; reports +FM since being in MAU. Eldonna MD aware. Lab orders placed from triage: na

## 2024-05-27 NOTE — Progress Notes (Signed)
 MFM Consultation  Ms. Burling is a 38 yo G9P5 at 24 w 3 d with an EDD of 09/06/24.  She is here for follow up growth exam.  She has multiple issue complicating her pregnancy Most notably CHTN BMI 50  CHF managed with Lasix  H/o DVT on Lovenox .  Patient Active Problem List   Diagnosis Date Noted   Hepatic steatosis during pregnancy 03/28/2024   Low fetal fraction on NIPS 03/20/2024   Advanced maternal age in multigravida 03/20/2024   RED chart patient 02/21/2024   At risk for injury related to surgical procedure, history of bowel injury 02/21/2024   Placenta positioned low in anterior wall of uterus 02/21/2024   Supervision of high risk pregnancy, antepartum 02/06/2024   History of congestive heart failure 01/22/2024   history of 5 c sections 12/27/2021   Ventral hernia without obstruction or gangrene 12/27/2021   History of substance abuse (HCC) 03/03/2015   History of gestational diabetes 02/16/2015   Asthma affecting pregnancy, antepartum 12/09/2014   Chronic hypertension during pregnancy, antepartum 12/09/2014   Anxiety 10/08/2014   Previous cesarean delivery affecting pregnancy, antepartum 10/08/2014   Hx of pulmonary embolus during pregnancy 10/08/2014   Protein S deficiency affecting pregnancy, antepartum 10/08/2014   BMI 50.0-59.9, adult (HCC) 08/28/2014   History of pre-eclampsia in prior pregnancy, currently pregnant in first trimester 08/28/2014    She denies s/sx of PTL or Preeclampsia however, she complains of worsening SOB and coughing at night when she lays down. She says she cannot sleep at night either.  She has a f/u maternal echo today and scheduled to see cardiology on Friday.     05/27/2024    1:17 PM 05/15/2024    9:30 PM 05/15/2024    5:49 PM  Vitals with BMI  Systolic 136 124 854  Diastolic 92 69 82  Pulse  89 95   Imaging: SIUP with measurements consistent with dates Good fetal movement and amniotic fluid again observed Suboptimal views of the  fetal anatomy was again seen secondary to fetal position and maternal habitus.   Impression/Counseling:  1) CHF- I discussed with Ms Franey her symptoms and recommended she go to MAU for further evaluation after her echo (they will likely want to get one anyway). May need additional Lasix . Will see after echocardiogram. Consider consulting cards if admitted. BNP on 11/20 was 20.  2) CHTN-BP stable on Labetalol   3) h/o PE on Lovenox . -Needs AntiXa assessment again.  Repeat growth scheduled in 3-4 weeks.   I spent 30 minutes with > 50% in face to face consultation  Nathanel DOROTHA Fetters, MD

## 2024-05-28 ENCOUNTER — Inpatient Hospital Stay (HOSPITAL_COMMUNITY)

## 2024-05-28 ENCOUNTER — Other Ambulatory Visit (HOSPITAL_COMMUNITY): Payer: Self-pay

## 2024-05-28 DIAGNOSIS — I509 Heart failure, unspecified: Secondary | ICD-10-CM

## 2024-05-28 DIAGNOSIS — Z6841 Body Mass Index (BMI) 40.0 and over, adult: Secondary | ICD-10-CM

## 2024-05-28 DIAGNOSIS — I2699 Other pulmonary embolism without acute cor pulmonale: Secondary | ICD-10-CM

## 2024-05-28 DIAGNOSIS — N736 Female pelvic peritoneal adhesions (postinfective): Secondary | ICD-10-CM | POA: Diagnosis present

## 2024-05-28 DIAGNOSIS — J45909 Unspecified asthma, uncomplicated: Secondary | ICD-10-CM

## 2024-05-28 DIAGNOSIS — O99119 Other diseases of the blood and blood-forming organs and certain disorders involving the immune mechanism complicating pregnancy, unspecified trimester: Secondary | ICD-10-CM

## 2024-05-28 DIAGNOSIS — O99519 Diseases of the respiratory system complicating pregnancy, unspecified trimester: Secondary | ICD-10-CM

## 2024-05-28 DIAGNOSIS — Z86711 Personal history of pulmonary embolism: Secondary | ICD-10-CM

## 2024-05-28 DIAGNOSIS — Z8759 Personal history of other complications of pregnancy, childbirth and the puerperium: Secondary | ICD-10-CM

## 2024-05-28 DIAGNOSIS — K436 Other and unspecified ventral hernia with obstruction, without gangrene: Secondary | ICD-10-CM | POA: Diagnosis present

## 2024-05-28 DIAGNOSIS — Z3A25 25 weeks gestation of pregnancy: Secondary | ICD-10-CM

## 2024-05-28 DIAGNOSIS — D6859 Other primary thrombophilia: Secondary | ICD-10-CM

## 2024-05-28 DIAGNOSIS — O26892 Other specified pregnancy related conditions, second trimester: Secondary | ICD-10-CM

## 2024-05-28 DIAGNOSIS — R0600 Dyspnea, unspecified: Secondary | ICD-10-CM

## 2024-05-28 LAB — RESPIRATORY PANEL BY PCR

## 2024-05-28 LAB — ECHOCARDIOGRAM COMPLETE
AR max vel: 2.9 cm2
AV Area VTI: 3.37 cm2
AV Area mean vel: 2.92 cm2
AV Mean grad: 5 mmHg
AV Peak grad: 8.3 mmHg
Ao pk vel: 1.44 m/s
Area-P 1/2: 3.21 cm2
Height: 67 in
S' Lateral: 3.4 cm
Weight: 6278.7 [oz_av]

## 2024-05-28 LAB — BASIC METABOLIC PANEL WITH GFR
Anion gap: 5 (ref 5–15)
BUN: 10 mg/dL (ref 6–20)
CO2: 29 mmol/L (ref 22–32)
Calcium: 8.7 mg/dL — ABNORMAL LOW (ref 8.9–10.3)
Chloride: 102 mmol/L (ref 98–111)
Creatinine, Ser: 0.78 mg/dL (ref 0.44–1.00)
GFR, Estimated: >60 mL/min (ref >60)
Glucose, Bld: 121 mg/dL — ABNORMAL HIGH (ref 70–99)
Potassium: 4.7 mmol/L (ref 3.5–5.1)
Sodium: 136 mmol/L (ref 135–145)

## 2024-05-28 LAB — HEPARIN ANTI-XA: Heparin LMW: 0.34 [IU]/mL

## 2024-05-28 LAB — GLUCOSE, CAPILLARY
Glucose-Capillary: 102 mg/dL — ABNORMAL HIGH (ref 70–99)
Glucose-Capillary: 146 mg/dL — ABNORMAL HIGH (ref 70–99)

## 2024-05-28 LAB — D-DIMER, QUANTITATIVE: D-Dimer, Quant: 0.58 ug{FEU}/mL — ABNORMAL HIGH (ref 0.00–0.50)

## 2024-05-28 MED ORDER — ENOXAPARIN SODIUM 80 MG/0.8ML IJ SOSY
80.0000 mg | PREFILLED_SYRINGE | Freq: Two times a day (BID) | INTRAMUSCULAR | Status: DC
  Administered 2024-05-28 – 2024-05-29 (×3): 80 mg via SUBCUTANEOUS
  Filled 2024-05-28 (×3): qty 0.8

## 2024-05-28 MED ORDER — OXYCODONE HCL 5 MG PO TABS
5.0000 mg | ORAL_TABLET | Freq: Four times a day (QID) | ORAL | Status: AC | PRN
  Administered 2024-05-28 – 2024-05-29 (×3): 5 mg via ORAL
  Filled 2024-05-28 (×3): qty 1

## 2024-05-28 MED ORDER — SODIUM CHLORIDE 0.9% FLUSH: 3.0000 mL | INTRAVENOUS | Status: DC | PRN

## 2024-05-28 MED ORDER — ENOXAPARIN SODIUM 100 MG/ML IJ SOSY
100.0000 mg | PREFILLED_SYRINGE | Freq: Two times a day (BID) | INTRAMUSCULAR | Status: DC
  Administered 2024-05-28 – 2024-05-29 (×3): 100 mg via SUBCUTANEOUS
  Filled 2024-05-28 (×3): qty 1

## 2024-05-28 MED ORDER — SODIUM CHLORIDE 0.9% FLUSH
3.0000 mL | Freq: Two times a day (BID) | INTRAVENOUS | Status: DC
  Administered 2024-05-28 – 2024-05-29 (×3): 3 mL via INTRAVENOUS

## 2024-05-28 MED ORDER — POLYETHYLENE GLYCOL 3350 17 G PO PACK
17.0000 g | PACK | Freq: Every day | ORAL | Status: DC
  Administered 2024-05-28: 17 g via ORAL
  Filled 2024-05-28 (×3): qty 1

## 2024-05-28 MED ORDER — LORAZEPAM 1 MG PO TABS
2.0000 mg | ORAL_TABLET | Freq: Once | ORAL | Status: AC | PRN
  Administered 2024-05-28: 2 mg via ORAL
  Filled 2024-05-28: qty 2

## 2024-05-28 NOTE — Progress Notes (Addendum)
 OB note VQ scanner with 325lbs weight limit and d-dimer just barely over 500. Will continue with therapeutic dose lovenox  and d/w pt tomorrow re: trying again for CT PE, with more anti-anxiety medications  I spoke to Burnard Louder with Gen Surg and she will send a staff message to Drs. Rubin and Stechschulte and will let us  know if one of them is willing to see her or if they think she needs to be referred to tertiary. If they are willing to see then she will get it scheduled   Bebe Izell Raddle MD Attending Center for [facility] Healthcare (Faculty Practice) 05/28/2024 Time: [phone]

## 2024-05-28 NOTE — TOC Initial Note (Incomplete)
 Transition of Care [facility]) - Initial/Assessment Note    Patient Details  Name: Brooke Sandoval MRN: [id number] Date of Birth: [birth year]  Transition of Care [facility]) CM/SW Contact:    Charlene Julian Daring, RN Phone Number: 05/28/2024, 4:44 PM  Clinical Narrative:                 Daesha Insco is a 38 y.o. [id number] at [redacted]w[redacted]d, admitted for worsened pulmonary s/s, concern for CHF   CM met with patient and provided food bags that were donated by [facility] to patient for home ( family use) . Patient verbalized appreciation. She shared she does not have PCP but is following with OB currently and does not have a preference. CM secured a PCP for patient at [facility] in Jan 15th at 10:20 am  with Greig Chute NP at [facility].( placed in AVS follow up section).  Patient will continue to follow with OB as well during pregnancy.  Patient is working along with CSW regarding insurance and transportation.  Any scripts needed for discharge please send to: [facility] prior to discharge. Patient has support person in room ( friend) that shared she can provide ride home when ready for discharge.    CM called [facility] and spoke to Leita # [phone] and gave referral. They are unable to accept referral at this time.  Will notifiy CM if that changes    Expected Discharge Plan and Services  Patient's home- PCP appointment arranged [employer] - contact shared  Food resource-[employer] shared with patient.  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   Pelvic adhesions 05/28/2024   Acute congestive heart failure (HCC) 05/27/2024   Hepatic steatosis during pregnancy 03/28/2024   Low fetal fraction on NIPS 03/20/2024   Advanced maternal age in multigravida 03/20/2024   RED chart patient 02/21/2024   At risk for injury related to surgical procedure, history of bowel injury 02/21/2024   Placenta positioned low in anterior wall of uterus 02/21/2024   Supervision of high risk pregnancy, antepartum 02/06/2024   History of congestive heart failure 01/22/2024   history of 5 c sections 12/27/2021   Ventral hernia without obstruction or gangrene 12/27/2021   History of substance abuse (HCC) 03/03/2015   History of gestational diabetes 02/16/2015   Asthma affecting pregnancy, antepartum 12/09/2014   Chronic hypertension during pregnancy, antepartum 12/09/2014   Anxiety 10/08/2014   Previous cesarean delivery affecting pregnancy, antepartum 10/08/2014   Hx of pulmonary embolus during pregnancy 10/08/2014   Protein S deficiency affecting pregnancy, antepartum 10/08/2014   BMI 50.0-59.9, adult (HCC) 08/28/2014   History of pre-eclampsia in prior pregnancy, currently pregnant in first trimester 08/28/2014   PCP:  Patient, No Pcp Per Pharmacy:   [id number] [place], [place] - [address] AT [address] [place] [address] Phone: [phone] Fax: [phone]  [facility] - [place], [place] -  [address] [place] [address] Phone: [phone] Fax: [phone]   - [facility] [address] [place] [address] Phone: [phone] Fax: [phone]  [facility] #[id number] - [address] [place] - [address] [facility] [address] [place] [address] Phone: [phone] Fax: [phone]  [facility] [place] [address] [place] [address] Phone: [phone] Fax: [phone]  [facility] [address] [place] [address] Phone: [phone] Fax: [phone]     Social Drivers of Health (SDOH) Social History: SDOH Screenings   Food Insecurity: No Food Insecurity (05/27/2024)  Housing: High Risk (05/27/2024)  Transportation Needs: Unmet Transportation Needs (05/27/2024)  Utilities: At Risk (05/27/2024)  Tobacco Use: Low Risk  (05/27/2024)   SDOH Interventions: Housing Interventions: [employer] Referral, Inpatient TOC Transportation Interventions: Inpatient TOC, [employer] Community Resource Referral Utilities Interventions: Inpatient TOC, [employer] Community Resource Referral   Readmission Risk Interventions     No data to display

## 2024-05-28 NOTE — Inpatient Diabetes Management (Signed)
 Inpatient Diabetes Program Recommendations  ADA Standards of Care 2023 Diabetes in Pregnancy Target Glucose Ranges:  Fasting: 70 - 95 mg/dL 1 hr postprandial:  889 - 140mg /dL (from first bite of meal) 2 hr postprandial:  100 - 120 mg/dL (from first bit of meal)    Lab Results  Component Value Date   GLUCAP 146 (H) 05/28/2024   HGBA1C 5.6 02/21/2024    Review of Glycemic Control  Latest Reference Range & Units 05/28/24 07:55 05/28/24 11:03  Glucose-Capillary 70 - 99 mg/dL 897 (H) 853 (H)  (H): Data is abnormally high  Diabetes history: History of GDM Outpatient Diabetes medications: none Current orders for Inpatient glycemic control: CBGs  Spoke with patient on the phone.  She has a history of GDM with 1 pregnancy.  She states she does not have DM.  Noticed CBG was 146 at 11am.  If CBGs trend greater than pregnancy target, might consider:  Novolog 0-14 units 2 hrs postprandial Might also consider GTT while inpatient if CBGs elevated  Thank you, Wyvonna Pinal, MSN, CDCES Diabetes Coordinator Inpatient Diabetes Program [phone] (team pager from 8a-5p)

## 2024-05-28 NOTE — Consult Note (Addendum)
 Initial Consultation Note   Patient: Brooke Sandoval FMW:[id number] DOB: 12-Jan-1986 PCP: Patient, No Pcp Per DOA: 05/27/2024 DOS: the patient was seen and examined on 05/28/2024 Primary service: Izell Harari, MD  Referring physician: Izell Harari, MD Reason for consult: Dyspnea in the setting of CHF/asthma/history of DVT/PE  HPI/course: Yuma Blucher is a 38 y.o. female with medical history significant of obesity, anxiety, asthma, protein S deficiency, DVT/PE, CHF presented with shortness of breath yesterday.  Patient presented with 2 days of shortness of breath began to have feelings like she was drowning when she lies flat as well as requiring 4 pillows to sit more upright at night.  Also reporting chronic cough as well as worsening bilateral lower extreme edema with transient right lower extremity edema greater than left that seem to improve after she took her Lovenox  and an extra aspirin .  Also has reported having sleep apnea with no formal diagnosis.  Patient was admitted by OB/GYN service as she is G9 P0-7-1-5 currently 25W 3D gestation.  Has not been hypoxic here.  Has been started on home Breo and as needed DuoNebs.  Has received Lasix  40 mg twice daily.  Echocardiogram ordered.  OB/GYN team has reached out to cardiology service as well.  Patient continues to report dyspnea.  And has history of PE during pregnancy.  Currently on Lovenox  but with transient unilateral edema the patient reported patient is being evaluated with lower extremity Doppler and VQ scan (unable to tolerate CTA).  CBC yesterday showed stable leukocytosis 11.5 and stable anemia 10.2.  BMP today showed glucose 121 and calcium  8.7.  Chest x-ray yesterday clear.  BNP and troponin yesterday normal.  Though BNP could be falsely low in the setting of obesity.   Review of Systems: As per HPI otherwise all other systems reviewed and are negative.  Past Medical History:  Diagnosis Date   ADHD (attention deficit  hyperactivity disorder)    Anxiety    Attention deficit hyperactivity disorder (ADHD), 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  Prescribed by [facility] in [place].  She DC'd with + UPT but would like to restart.  Counseled not recommended in pregnancy.  Counseled to establish with [facility] ASAP as this clinic will not prescribe those types of meds.  Verbal and written information provided today.  Will discuss with MFM at [facility] .     If symptoms not well controlled with counseling/behavioral therapy, re   CHF (congestive heart failure) (HCC)    after admission for vetnal hernia   Coagulation factor disorder    DVT (deep venous thrombosis) (HCC)    History of chlamydia infection 09/12/2014   3/19 Called in Azithromycin Rx for positive chlamydia.   4/28:  TOC NEG         02/25/2015  TOC neg 8/10     History of gestational diabetes    History of pre-eclampsia    Miscarriage    Poor dentition 10/10/2014   Records note poor dentition in prior records. Please ensure she is getting regular dental care.     Poor intravenous access 12/09/2014   In last pregnancy, required port access.      Past Surgical History:  Procedure Laterality Date   APPENDECTOMY     CESAREAN SECTION     TONSILLECTOMY     VENTRAL HERNIA REPAIR      Social History  reports that she has never smoked. She has never been exposed to tobacco smoke. She has never used smokeless tobacco.  She reports that she does not currently use alcohol. She reports that she does not use drugs.  Allergies  Allergen Reactions   Prednisone  Swelling    Family History  Problem Relation Age of Onset   Depression Mother    COPD Mother    Diabetes Mother    Schizophrenia Father    Drug abuse Sister   Reviewed   Prior to Admission medications   Medication Sig Start Date End Date Taking? Authorizing Provider  albuterol  (VENTOLIN  HFA) 108 (90 Base) MCG/ACT inhaler Inhale 1-2 puffs into the lungs every 6 (six) hours as needed for  wheezing or shortness of breath. 05/12/24  Yes Reddick, Johnathan B, NP  aspirin  (ASPIRIN  CHILDRENS) 81 MG chewable tablet Chew 2 tablets (162 mg total) by mouth daily. 02/21/24 02/20/25 Yes Zina Jerilynn LABOR, MD  budesonide -formoterol  (SYMBICORT ) 160-4.5 MCG/ACT inhaler Inhale 2 puffs into the lungs 2 (two) times daily. 05/15/24  Yes Fredirick Glenys RAMAN, MD  docusate sodium  (COLACE) 100 MG capsule Take 1 capsule (100 mg total) by mouth 2 (two) times daily. 01/22/24 01/21/25 Yes Lorrane Pac, MD  enoxaparin  (LOVENOX ) 100 MG/ML injection Inject 1 mL (100 mg total) into the skin every 12 (twelve) hours. 05/07/24  Yes Lola Donnice HERO, MD  furosemide  (LASIX ) 40 MG tablet Take 1 tablet (40 mg total) by mouth daily. 01/22/24 01/21/25 Yes Lorrane Pac, MD  ipratropium-albuterol  (DUONEB) 0.5-2.5 (3) MG/3ML SOLN Take 3 mLs by nebulization every 6 (six) hours as needed. 05/12/24  Yes Reddick, Johnathan B, NP  Iron , Ferrous Sulfate , 325 (65 Fe) MG TABS Take 1 tablet by mouth every other day. 01/13/24 05/27/24 Yes Jhonny Augustin BROCKS, MD  labetalol  (NORMODYNE ) 200 MG tablet Take 1 tablet (200 mg total) by mouth 2 (two) times daily. 04/27/24  Yes Trudy Leeroy NOVAK, MD  ondansetron  (ZOFRAN -ODT) 4 MG disintegrating tablet Take 1 tablet (4 mg total) by mouth every 8 (eight) hours as needed for nausea or vomiting. 03/20/24  Yes Zina Jerilynn LABOR, MD  oxyCODONE  (OXY IR/ROXICODONE ) 5 MG immediate release tablet Take 1 tablet (5 mg total) by mouth every 4 (four) hours as needed (breakthrough pain). 05/15/24  Yes Fredirick Glenys RAMAN, MD  Prenatal Vit-Fe Fumarate-FA (MULTIVITAMIN-PRENATAL) 27-0.8 MG TABS tablet Take 1 tablet by mouth daily at 12 noon.   Yes [provider]  Blood Pressure Monitoring DEVI 1 each by Does not apply route once a week. Patient not taking: Reported on 05/15/2024 03/20/24   Zina Jerilynn LABOR, MD  ondansetron  (ZOFRAN -ODT) 4 MG disintegrating tablet Take 1 tablet (4 mg total) by mouth every 8 (eight) hours as  needed for nausea vomitting 03/20/24   Zina Jerilynn LABOR, MD  promethazine  (PHENERGAN ) 6.25 MG/5ML solution Take 5 mLs (6.25 mg total) by mouth 4 (four) times daily as needed for nausea or vomiting. 01/22/24 01/21/25  Lorrane Pac, MD    Physical Exam: Vitals:   05/28/24 0802 05/28/24 0807 05/28/24 0941 05/28/24 1349  BP:   132/61 111/65  Pulse:   93 88  Resp:    19  Temp:    97.6 F (36.4 C)  TempSrc:    Oral  SpO2: 100% 100%  94%  Weight:      Height:        Physical Exam Constitutional:      General: She is not in acute distress.    Appearance: Normal appearance. She is obese.  HENT:     Head: Normocephalic and atraumatic.     Mouth/Throat:  Mouth: Mucous membranes are moist.     Pharynx: Oropharynx is clear.  Eyes:     Extraocular Movements: Extraocular movements intact.     Pupils: Pupils are equal, round, and reactive to light.  Cardiovascular:     Rate and Rhythm: Normal rate and regular rhythm.     Pulses: Normal pulses.     Heart sounds: Normal heart sounds.  Pulmonary:     Effort: Pulmonary effort is normal. No respiratory distress.     Breath sounds: Normal breath sounds.  Abdominal:     General: Bowel sounds are normal. There is no distension.     Tenderness: There is no abdominal tenderness.     Comments: Obese and Gravid abdomen  Musculoskeletal:        General: No swelling or deformity.     Right lower leg: Edema present.     Left lower leg: Edema present.  Skin:    General: Skin is warm and dry.  Neurological:     General: No focal deficit present.     Mental Status: Mental status is at baseline.    Labs on Admission: I have personally reviewed following labs and imaging studies  CBC: Recent Labs  Lab 05/27/24 1618  WBC 11.5*  HGB 10.2*  HCT 32.2*  MCV 86.3  PLT 239    Basic Metabolic Panel: Recent Labs  Lab 05/27/24 1618 05/28/24 0540  NA 135 136  K 4.1 4.7  CL 102 102  CO2 24 29  GLUCOSE 144* 121*  BUN 12 10  CREATININE  0.67 0.78  CALCIUM 8.6* 8.7*    GFR: Estimated Creatinine Clearance: 159.7 mL/min (by C-G formula based on SCr of 0.78 mg/dL).  Liver Function Tests: Recent Labs  Lab 05/27/24 1618  AST 15  ALT 13  ALKPHOS 57  BILITOT 0.2  PROT 6.0*  ALBUMIN 2.3*    Urine analysis:    Component Value Date/Time   COLORURINE AMBER (A) 04/27/2024 1326   APPEARANCEUR CLOUDY (A) 04/27/2024 1326   LABSPEC 1.025 04/27/2024 1326   PHURINE 5.0 04/27/2024 1326   GLUCOSEU NEGATIVE 04/27/2024 1326   HGBUR NEGATIVE 04/27/2024 1326   BILIRUBINUR NEGATIVE 04/27/2024 1326   KETONESUR NEGATIVE 04/27/2024 1326   PROTEINUR 30 (A) 04/27/2024 1326   UROBILINOGEN 1.0 03/20/2024 1401   NITRITE NEGATIVE 04/27/2024 1326   LEUKOCYTESUR NEGATIVE 04/27/2024 1326    Radiological Exams on Admission: VAS US  LOWER EXTREMITY VENOUS (DVT) Result Date: 05/28/2024  Lower Venous DVT Study Patient Name:  KEELAN POMERLEAU  Date of Exam:   05/28/2024 Medical Rec #: [id number]       Accession #:    [id number] Date of Birth: 1986-05-18        Patient Gender: F Patient Age:   58 years Exam Location:  [facility] Procedure:      VAS US  LOWER EXTREMITY VENOUS (DVT) Referring Phys: SUZEN NEWTON --------------------------------------------------------------------------------  Indications: Pulmonary embolism. Other Indications: [redacted] weeks pregnant. Anticoagulation: Lovenox . Comparison Study: Previous study on the left lower extremity on 10.27.2023. Performing Technologist: Edilia Elden Appl  Examination Guidelines: A complete evaluation includes B-mode imaging, spectral Doppler, color Doppler, and power Doppler as needed of all accessible portions of each vessel. Bilateral testing is considered an integral part of a complete examination. Limited examinations for reoccurring indications may be performed as noted. The reflux portion of the exam is performed with the patient in reverse Trendelenburg.   +---------+---------------+---------+-----------+----------+----------------+ RIGHT    CompressibilityPhasicitySpontaneityPropertiesThrombus Aging   +---------+---------------+---------+-----------+----------+----------------+ CFV  Full           Yes      Yes                                   +---------+---------------+---------+-----------+----------+----------------+ SFJ      Full           Yes      Yes                                   +---------+---------------+---------+-----------+----------+----------------+ FV Prox  Full                                                          +---------+---------------+---------+-----------+----------+----------------+ FV Mid   Full                                                          +---------+---------------+---------+-----------+----------+----------------+ FV DistalFull                                                          +---------+---------------+---------+-----------+----------+----------------+ PFV      Full                                                          +---------+---------------+---------+-----------+----------+----------------+ POP      Full           Yes      Yes                                   +---------+---------------+---------+-----------+----------+----------------+ PTV                                                   Patent by color. +---------+---------------+---------+-----------+----------+----------------+ PERO                                                  Patent by color. +---------+---------------+---------+-----------+----------+----------------+   +---------+---------------+---------+-----------+----------+----------------+ LEFT     CompressibilityPhasicitySpontaneityPropertiesThrombus Aging   +---------+---------------+---------+-----------+----------+----------------+ CFV      Full           Yes      Yes                                    +---------+---------------+---------+-----------+----------+----------------+  SFJ      Full                                                          +---------+---------------+---------+-----------+----------+----------------+ FV Prox  Full                                                          +---------+---------------+---------+-----------+----------+----------------+ FV Mid   Full                                                          +---------+---------------+---------+-----------+----------+----------------+ FV DistalFull                                                          +---------+---------------+---------+-----------+----------+----------------+ PFV      Full                                                          +---------+---------------+---------+-----------+----------+----------------+ POP      Full           Yes      Yes                                   +---------+---------------+---------+-----------+----------+----------------+ PTV                                                   Patent by color. +---------+---------------+---------+-----------+----------+----------------+ PERO                                                  Patent by color. +---------+---------------+---------+-----------+----------+----------------+     Summary: RIGHT: - There is no evidence of deep vein thrombosis in the lower extremity. However, portions of this examination were limited- see technologist comments above.  - No cystic structure found in the popliteal fossa.  LEFT: - There is no evidence of deep vein thrombosis in the lower extremity. However, portions of this examination were limited- see technologist comments above.  - No cystic structure found in the popliteal fossa.  *See table(s) above for measurements and observations.    Preliminary    ECHOCARDIOGRAM COMPLETE Result Date: 05/28/2024    ECHOCARDIOGRAM REPORT   Patient Name:   ALBIRDA SHIEL Date of Exam: 05/28/2024 Medical Rec #:  [id number]      Height:       67.0 in Accession #:    [id number]     Weight:       392.4 lb Date of Birth:  November 26, 1985       BSA:          2.693 m Patient Age:    38 years       BP:           123/55 mmHg Patient Gender: F              HR:           96 bpm. Exam Location:  Inpatient Procedure: 2D Echo, Cardiac Doppler and Color Doppler (Both Spectral and Color            Flow Doppler were utilized during procedure). Indications:    Congestive Heart Failure I50.9  History:        Patient has no prior history of Echocardiogram examinations.                 CHF.  Sonographer:    Jayson Gaskins Referring Phys: [id number] [facility] NILES NEWTON  Sonographer Comments: No Definity used due to pregnancy. IMPRESSIONS  1. Left ventricular ejection fraction, by estimation, is 55 to 60%. The left ventricle has normal function. Left ventricular endocardial border not optimally defined to evaluate regional wall motion. Left ventricular diastolic parameters were normal.  2. Right ventricular systolic function is normal. The right ventricular size is normal.  3. The mitral valve is grossly normal. Trivial mitral valve regurgitation.  4. The aortic valve was not well visualized. Aortic valve regurgitation is not visualized. Comparison(s): Technically difficutl evaluation but overall normal echocardiogram. FINDINGS  Left Ventricle: Left ventricular ejection fraction, by estimation, is 55 to 60%. The left ventricle has normal function. Left ventricular endocardial border not optimally defined to evaluate regional wall motion. The left ventricular internal cavity size was normal in size. There is borderline left ventricular hypertrophy. Left ventricular diastolic parameters were normal. Right Ventricle: The right ventricular size is normal. No increase in right ventricular wall thickness. Right ventricular systolic function is normal. Left Atrium: Left atrial size was normal in size. Right Atrium:  Right atrial size was normal in size. Pericardium: There is no evidence of pericardial effusion. Presence of epicardial fat layer. Mitral Valve: The mitral valve is grossly normal. Trivial mitral valve regurgitation. Tricuspid Valve: The tricuspid valve is grossly normal. Tricuspid valve regurgitation is trivial. Aortic Valve: The aortic valve was not well visualized. Aortic valve regurgitation is not visualized. Aortic valve mean gradient measures 5.0 mmHg. Aortic valve peak gradient measures 8.3 mmHg. Aortic valve area, by VTI measures 3.37 cm. Pulmonic Valve: The pulmonic valve was not well visualized. Pulmonic valve regurgitation is not visualized. Aorta: The aortic root and ascending aorta are structurally normal, with no evidence of dilitation. IAS/Shunts: The interatrial septum was not well visualized.  LEFT VENTRICLE PLAX 2D LVIDd:         4.20 cm   Diastology LVIDs:         3.40 cm   LV e' medial:    7.40 cm/s LV PW:         1.20 cm   LV E/e' medial:  10.6 LV IVS:        0.90 cm   LV e' lateral:   11.20 cm/s LVOT diam:     2.00 cm   LV E/e' lateral: 7.0 LV SV:  89 LV SV Index:   33 LVOT Area:     3.14 cm  RIGHT VENTRICLE RV S prime:     13.40 cm/s TAPSE (M-mode): 2.4 cm LEFT ATRIUM             Index        RIGHT ATRIUM           Index LA Vol (A2C):   35.9 ml 13.33 ml/m  RA Area:     10.10 cm LA Vol (A4C):   51.1 ml 18.97 ml/m  RA Volume:   19.80 ml  7.35 ml/m LA Biplane Vol: 42.7 ml 15.86 ml/m  AORTIC VALVE AV Area (Vmax):    2.90 cm AV Area (Vmean):   2.92 cm AV Area (VTI):     3.37 cm AV Vmax:           144.00 cm/s AV Vmean:          112.000 cm/s AV VTI:            0.264 m AV Peak Grad:      8.3 mmHg AV Mean Grad:      5.0 mmHg LVOT Vmax:         133.00 cm/s LVOT Vmean:        104.000 cm/s LVOT VTI:          0.283 m LVOT/AV VTI ratio: 1.07  AORTA Ao Root diam: 2.70 cm Ao Asc diam:  3.70 cm MITRAL VALVE MV Area (PHT): 3.21 cm    SHUNTS MV Decel Time: 236 msec    Systemic VTI:  0.28 m MV E  velocity: 78.10 cm/s  Systemic Diam: 2.00 cm MV A velocity: 91.20 cm/s MV E/A ratio:  0.86 Franck Azobou Tonleu Electronically signed by Joelle Cedars Tonleu Signature Date/Time: 05/28/2024/9:32:56 AM    Final    DG Chest Portable 1 View Result Date: 05/27/2024 CLINICAL DATA:  Shortness of breath.  Cough. EXAM: PORTABLE CHEST 1 VIEW COMPARISON:  Radiograph 05/15/2024 FINDINGS: The cardiomediastinal contours are normal. The lungs are clear. Pulmonary vasculature is normal. No consolidation, pleural effusion, or pneumothorax. No acute osseous abnormalities are seen. IMPRESSION: No acute findings. Electronically Signed   By: Andrea Gasman M.D.   On: 05/27/2024 19:47   US  MFM OB FOLLOW UP Result Date: 05/27/2024 ----------------------------------------------------------------------  OBSTETRICS REPORT                       (Signed Final 05/27/2024 02:43 pm) ---------------------------------------------------------------------- Patient Info  ID #:       [id number]                          D.O.B.:  03/11/1986 (38 yrs)(F)  Name:       Belina Kinzie                  Visit Date: 05/27/2024 02:04 pm ---------------------------------------------------------------------- Performed By  Attending:        Nathanel Fetters      Ref. Address:     [address]                    [place]  [place], [place]                                                             [address]  Performed By:     Meade Parker       [facility]  Referred By:      [facility] ---------------------------------------------------------------------- Orders  #  Description                           Code        Ordered By  1  US  MFM OB  FOLLOW UP                   [phone]    BABARA KEYS ----------------------------------------------------------------------  #  Order #                     Accession #                Episode #  1  [id number]                   [id number]                 [id number] ---------------------------------------------------------------------- Indications  [redacted] weeks gestation of pregnancy                Z3A.25  Abdominal pain in pregnancy                    O99.89  Obesity complicating pregnancy, second         O99.212  trimester (BMI 48)  Advanced maternal age multigravida 31+,        O52.522  second trimester (38 yrs)  Previous cesarean delivery, antepartum (x5)    O1.219  Grand multiparity, antepartum                  O09.40  Poor obstetric history: Previous preterm       O09.219  delivery, antepartum (No term deliveries)  Pre-existing essential hypertension            O10.012  complicating pregnancy, second trimester  Medical complication of pregnancy (Hepatic     O26.90  steatosis, ventral hernia)(Hx of DVT, SUD,  CHF [Lasix ], bowel injury)  Poor obstetric history: Previous               O09.299  preeclampsia, GDM, PE (on Lovenox ),  HELLP  Protein S deficiency complicating pregnancy    O99.199  Poor obstetric history: Previous IUFD          O4.299  (stillbirth) x2, 24w 2007, 24w 2011)  Abnormal chromosomal and genetic finding       O28.5  on antenatal screening of mother (Low fetal  fraction on Panorama, LOW RISK [facility]  NIPS)  Encounter for other antenatal screening        Z36.2  follow-up  Obesity complicating pregnancy, second         O99.212  trimester (BMI 57)  Encounter for other antenatal screening        Z36.2  follow-up ---------------------------------------------------------------------- Fetal Evaluation  Num Of Fetuses:         1  Cardiac Activity:       Observed  Presentation:           Transverse, head to maternal left  Placenta:               Anterior  P. Cord Insertion:      Previously seen  Amniotic Fluid  AFI  FV:      Within normal limits                              Largest Pocket(cm)                              5.28 ---------------------------------------------------------------------- Biometry  BPD:      66.2  mm     G. Age:  26w 5d         82  %    CI:        80.28   %    70 - 86                                                          FL/HC:      20.3   %    18.7 - 20.3  HC:      233.4  mm     G. Age:  25w 3d         25  %    HC/AC:      1.09        1.04 - 1.22  AC:      214.3  mm     G. Age:  26w 0d         57  %    FL/BPD:     71.6   %    71 - 87  FL:       47.4  mm     G. Age:  25w 6d         49  %    FL/AC:      22.1   %  20 - 24  CER:      26.6  mm     G. Age:  23w 6d         14  %  LV:        4.8  mm  CM:        4.9  mm  Est. FW:     865  gm    1 lb 15 oz      60  % ---------------------------------------------------------------------- OB History  Gravidity:    9         Term:   0        Prem:   7        SAB:   1  TOP:          0       Ectopic:  0        Living: 5 ---------------------------------------------------------------------- Gestational Age  U/S Today:     26w 0d                                        EDD:   09/02/24  Best:          25w 3d     Det. By:  Early Ultrasound         EDD:   09/06/24                                      (01/22/24) ---------------------------------------------------------------------- Targeted Anatomy  Central Nervous System  Calvarium/Cranial V.:  Appears normal         Cereb./Vermis:          Not well visualized  Cavum:                 Appears normal         Cisterna Magna:         Not well visualized  Lateral Ventricles:    Appears normal         Midline Falx:           Appears normal  Choroid Plexus:        Not well visualized  Spine  Cervical:              Not well visualized    Sacral:                 Not well visualized  Thoracic:              Not well visualized    Shape/Curvature:        Not well visualized  Lumbar:                Not well visualized  Head/Neck   Lips:                  Not well visualized    Profile:                Appears normal  Neck:                  Not well visualized    Orbits/Eyes:            Lenses nwv  Nuchal Fold:           Not applicable  Mandible:               Not well visualized  Nasal Bone:            Present                Maxilla:                Not well visualized  Thorax  4 Chamber View:        Appears normal         Interventr. Septum:     Appears normal  Cardiac Rhythm:        Normal                 Cardiac Axis:           Appears normal  Cardiac Situs:         Appears normal         Diaphragm:              Appears normal  Rt Outflow Tract:      Not well visualized    3 Vessel View:          Appears normal  Lt Outflow Tract:      Appears normal         3 V Trachea View:       Not well visualized  Aortic Arch:           Not well visualized    IVC:                    Appears normal  Ductal Arch:           Previously seen        Crossing:               Appears normal  SVC:                   Appears normal  Abdomen  Ventral Wall:          Appears normal         Lt Kidney:              Not well visualized  Cord Insertion:        Appears normal         Rt Kidney:              Not well visualized  Situs:                 Appears normal         Bladder:                Previously seen  Stomach:               Appears normal  Extremities  Lt Humerus:            Previously seen        Lt Femur:               Previously seen  Rt Humerus:            Previously seen        Rt Femur:               Previously seen  Lt Forearm:            Previously seen        Lt Lower Leg:  Previously seen  Rt Forearm:            Previously seen        Rt Lower Leg:           Previously seen  Lt Hand:               Previously seen        Lt Foot:                limited lateral view  Rt Hand:               Not well visualized    Rt Foot:                Not well visualized  Other  Umbilical Cord:        Previously seen        Genitalia:              Female  prev seen  Comment:     Technically difficult due to hernia in maternal RLQ, BMI 57, and               fetal position. ---------------------------------------------------------------------- Cervix Uterus Adnexa  Cervix  Not visualized (advanced GA >24wks)  Uterus  No abnormality visualized.  Right Ovary  Not visualized.  Left Ovary  Not visualized.  Cul De Sac  No free fluid seen.  Adnexa  No abnormality visualized ---------------------------------------------------------------------- Impression  MFM Consultation  Ms. Skeens is a 38 yo G9P5 at 5 w 3 d with an EDD of  09/06/24.  She is here for follow up growth exam.  She has multiple issue complicating her pregnancy  Most notably  CHTN  BMI 50  CHF managed with Lasix   H/o DVT on Lovenox .  She denies s/sx of PTL or Preeclampsia however, she  complains of worsening SOB and coughing at night when she  lays down. She says she cannot sleep at night either.  She has a f/u maternal echo today and scheduled to see  cardiology on Friday.           05/27/2024    1:17 PM       05/15/2024    9:30 PM  PM       05/15/2024    5:49 PM  Vitals with BMI  Systolic 136      124      145  Diastolic92       69       82  Pulse             89       95  Imaging:  SIUP with measurements consistent with dates  Good fetal movement and amniotic fluid again observed  Suboptimal views of the fetal anatomy was again seen  secondary to fetal position and maternal habitus.  Impression/Counseling:  1) CHF- I discussed with Ms Weissman her symptoms and  recommended she go to [facility] for further evaluation after her  echo (they will likely want to get one anyway).  May need additional Lasix . Will see after echocardiogram.  Consider consulting cards if admitted.  BNP on 11/20 was 20.  2) CHTN-BP stable on Labetalol   3) h/o PE on Lovenox .  -Needs AntiXa assessment again.  Repeat growth scheduled in 3-4 weeks.  I spent 30 minutes with > 50% in face to face consultation  Nathanel DOROTHA Fetters, MD  ----------------------------------------------------------------------  Nathanel Fetters, MD Electronically Signed Final Report   05/27/2024 02:43 pm ----------------------------------------------------------------------   EKG: Not yet performed this admission  Assessment/Plan Principal Problem:   Acute congestive heart failure (HCC) Active Problems:   Anxiety   Asthma affecting pregnancy, antepartum   BMI 50.0-59.9, adult (HCC)   Hx of pulmonary embolus during pregnancy   Protein S deficiency affecting pregnancy, antepartum   Irreducible Spigelian hernia   Pelvic adhesions   Dyspnea ?Acute CHF versus asthma exacerbation versus PE versus other. > Patient presenting with worsening shortness of breath and orthopnea.  Initial concern for clinical CHF as she also had worsening edema, as well as been covered for her asthma with DuoNeb.  Now working up for DVT/PE with Doppler and VQ scan. > BNP was normal though could be falsely low in the setting of obesity.  Chest x-ray normal as well though. > Is prescribed Lasix  outpatient.  Had outpatient echocardiogram ordered but had not been done.  Echo here showed EF 55-6%, normal diastolic function, normal RV function.  Has been continued on Lasix  due to clinical presentation however alternative etiologies are being investigated considering these normal findings. > Patient is also being treated for asthma with DuoNebs and home Breo.  Has tolerated steroid tapers in the past which he may benefit with if ongoing PE workup is negative. No wheezing when seen (just had Duoneb). Resp panel negative when seen for asthma 1-2 weeks ago. > Has had multiple pregnancies and says she begins to have issues around 20 weeks.  Has had to have early deliveries due to this in the past.  With her obesity she may have component of hypoventilation that is worsening as her pregnancy progresses.  This combined with her other respiratory issues may make her current  symptoms multifactorial. > Primary team have reached out to cardiology as well. - Add telemetry if tachycardia or PE positive - Appreciate additional input from cardiology - Will continue with diuretic for now but consider switching back to home diuretic pending response and course - Follow-up lower extremity Doppler: Preliminary results for DVT bilaterally.  However portions of the exam are limited. - Follow-up VQ scan - Continue with Lovenox  - Continue DuoNeb and Breo - If results are negative for PE and is failing to improve symptomatically with diuresis, would recommend steroids to further treat asthma component. - Full RVP - Trial of cpap  TR[facility] will continue to follow the patient.  Family Communication: Not present  Thank you very much for involving us  in the care of your patient.  Author: Marsa KATHEE Scurry, MD 05/28/2024 2:43 PM  For on call review [email or web address].

## 2024-05-28 NOTE — Progress Notes (Addendum)
 PHARMACY - ANTICOAGULATION CONSULT NOTE  Pharmacy Consult for Enoxaparin  Indication: Protein S deficiency  Allergies  Allergen Reactions   Prednisone  Swelling    Patient Measurements: Height: 5' 7 (170.2 cm) Weight: (!) 178 kg (392 lb 6.7 oz) IBW/kg (Calculated) : 61.6 HEPARIN DW (KG): 107.3  Vital Signs: Temp: 97.8 F (36.6 C) (12/03 0745) Temp Source: Oral (12/03 0745) BP: 121/55 (12/03 0745) Pulse Rate: 101 (12/03 0745)  Labs: Recent Labs    05/27/24 1618 05/28/24 0540  HGB 10.2*  --   HCT 32.2*  --   PLT 239  --   CREATININE 0.67 0.78  TROPONINIHS 6  --     Estimated Creatinine Clearance: 159.7 mL/min (by C-G formula based on SCr of 0.78 mg/dL).   Medical History: Past Medical History:  Diagnosis Date   ADHD (attention deficit hyperactivity disorder)    Anxiety    Attention deficit hyperactivity disorder (ADHD), 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  Prescribed by [facility] in [place].  She DC'd with + UPT but would like to restart.  Counseled not recommended in pregnancy.  Counseled to establish with [facility] ASAP as this clinic will not prescribe those types of meds.  Verbal and written information provided today.  Will discuss with MFM at [facility] .     If symptoms not well controlled with counseling/behavioral therapy, re   CHF (congestive heart failure) (HCC)    after admission for vetnal hernia   Coagulation factor disorder    DVT (deep venous thrombosis) (HCC)    History of chlamydia infection 09/12/2014   3/19 Called in Azithromycin Rx for positive chlamydia.   4/28:  TOC NEG         02/25/2015  TOC neg 8/10     History of gestational diabetes    History of pre-eclampsia    Miscarriage    Poor dentition 10/10/2014   Records note poor dentition in prior records. Please ensure she is getting regular dental care.     Poor intravenous access 12/09/2014   In last pregnancy, required port access.      Assessment: [redacted]w[redacted]d [id number] admitted for  bilateral leg swelling and shortness of breath. Prior to admission she was prescribed prophylactic lovenox  (100mg  SQ BID) due to protein S deficiency with hx of PE in pregnancy (2018) and multiple pregnancy losses. She is currently being evaluated for PE. Pharmacy has been consulted to continue prophylactic dosing of Enoxaparin .   Hgb 10.2, plt 239, no concern for bleeding at this time.   Goal of Therapy:  Anti-Xa level 0.2-0.4 units/mL 4h after LMWH dose given Monitor platelets by anticoagulation protocol: Yes   Plan:  Continue Lovenox  100mg  SQ BID Collect Anti Xa level in 4h Continue to monitor CBC and s/s of bleeding  Anjelica Gorniak 05/28/2024,8:33 AM  --------------------------------------------------------------------------------------------------------------- Addendum 05/28/2024 3:12 PM   Anti Xa level is 0.34, appropriate for prophylactic dosing  Plan Continue lovenox  100mg  SQ BID Monitor CBC and s/s of bleeding Pharmacy will follow workup for PE and escalate treatment as needed  Arnaldo Lark, PharmD 05/28/2024 3:12 PM   ---------------------------------------------------------------------------------------------------------------- Addendum 05/28/2024 4:10 PM   Unable to definitively rule out a PE with imaging due to patient inability to tolerate CT scanner. Plan to escalate to treatment dose lovenox  in the setting of potential PE until VTE can be definitively ruled out.   Plan: New Goal of therapy: Anti Xa level 0.6 - 1 units/mL 4h after LMWH dose given Increase lovenox  to 180mg  (~1mg /kg)  SQ BID Monitor CBC and s/s of bleeding Collect Anti Xa level 4h after the 3rd or 4th dose if continued Pharmacy will follow workup for PE and adjust treatment as needed  Arnaldo Lark, PharmD 05/28/2024 4:10 PM

## 2024-05-28 NOTE — Progress Notes (Signed)
 Daily Antepartum Note  Admission Date: 05/27/2024 Current Date: 05/28/2024 9:11 AM  Brooke Sandoval is a 38 y.o. H0E9284 at [redacted]w[redacted]d, admitted for worsened pulmonary s/s, concern for CHF  Pregnancy complicated by: Patient Active Problem List   Diagnosis Date Noted   Acute congestive heart failure (HCC) 05/27/2024   Hepatic steatosis during pregnancy 03/28/2024   Low fetal fraction on NIPS 03/20/2024   Advanced maternal age in multigravida 03/20/2024   RED chart patient 02/21/2024   At risk for injury related to surgical procedure, history of bowel injury 02/21/2024   Placenta positioned low in anterior wall of uterus 02/21/2024   Supervision of high risk pregnancy, antepartum 02/06/2024   History of congestive heart failure 01/22/2024   history of 5 c sections 12/27/2021   Ventral hernia without obstruction or gangrene 12/27/2021   History of substance abuse (HCC) 03/03/2015   History of gestational diabetes 02/16/2015   Asthma affecting pregnancy, antepartum 12/09/2014   Chronic hypertension during pregnancy, antepartum 12/09/2014   Anxiety 10/08/2014   Previous cesarean delivery affecting pregnancy, antepartum 10/08/2014   Hx of pulmonary embolus during pregnancy 10/08/2014   Protein S deficiency affecting pregnancy, antepartum 10/08/2014   BMI 50.0-59.9, adult (HCC) 08/28/2014   History of pre-eclampsia in prior pregnancy, currently pregnant in first trimester 08/28/2014    Overnight/24hr events:  S/s stable. No issues  Subjective:  Patient denies any OB s/s. She states she's had congestion for a few weeks with a cough that is sometimes productive of (normal phlegm). She also continues to endorse difficulty laying back/laying down. No current chest pain. SOB stable. She states she felt better after she got IV solumedrol on on 11/20  Objective:    Current Vital Signs 24h Vital Sign Ranges  T 97.8 F (36.6 C) Temp  Avg: 97.9 F (36.6 C)  Min: 97.8 F (36.6 C)  Max: 98 F (36.7  C)  BP (!) 121/55 BP  Min: 116/95  Max: 146/82  HR (!) 101 Pulse  Avg: 100.8  Min: 82  Max: 113  RR 20 Resp  Avg: 20  Min: 20  Max: 20  SaO2 100 % Room Air SpO2  Avg: 96.4 %  Min: 92 %  Max: 100 %       24 Hour I/O Current Shift I/O  Time Ins Outs 12/02 0701 - 12/03 0700 In: 180 [P.O.:180] Out: 700 [Urine:700] No intake/output data recorded.   Patient Vitals for the past 24 hrs:  BP Temp Temp src Pulse Resp SpO2 Height Weight  05/28/24 0807 -- -- -- -- -- 100 % -- --  05/28/24 0802 -- -- -- -- -- 100 % -- --  05/28/24 0745 (!) 121/55 97.8 F (36.6 C) Oral (!) 101 -- 95 % -- --  05/28/24 0430 127/85 98 F (36.7 C) Oral 82 20 97 % -- --  05/28/24 0032 -- -- -- -- -- 96 % -- --  05/27/24 2216 (!) 116/95 98 F (36.7 C) Oral 97 20 98 % -- --  05/27/24 2031 125/69 -- -- 97 -- -- -- --  05/27/24 2014 119/75 97.8 F (36.6 C) Oral (!) 103 20 -- -- --  05/27/24 1830 -- -- -- -- -- 96 % -- --  05/27/24 1715 (!) 146/82 -- -- (!) 105 -- 94 % -- --  05/27/24 1710 -- -- -- -- -- 97 % -- --  05/27/24 1700 (!) 139/96 -- -- (!) 108 -- 97 % -- --  05/27/24 1650 -- -- -- -- --  96 % -- --  05/27/24 1645 121/83 -- -- (!) 113 -- 92 % -- --  05/27/24 1620 -- -- -- -- -- 96 % -- --  05/27/24 1615 -- -- -- -- -- 95 % -- --  05/27/24 1610 -- -- -- -- -- 97 % -- --  05/27/24 1545 -- -- -- -- -- -- 5' 7 (1.702 m) (!) 178 kg    Fetal Heart Tones: pending Tocometry: pending  Physical exam: General: Well nourished, well developed female in no acute distress. Abdomen: obese, diffusely nttp Cardiovascular: S1, S2 normal, no murmur, rub or gallop, regular rate and rhythm Respiratory: coarse breath sounds diffusely and bilaterally, no wheezes or increased work of breathing Skin: Warm and dry.   Medications: Current Facility-Administered Medications  Medication Dose Route Frequency Provider Last Rate Last Admin   acetaminophen  (TYLENOL ) tablet 650 mg  650 mg Oral Q4H PRN Eldonna Suzen Octave, MD        aspirin  chewable tablet 162 mg  162 mg Oral Daily Eldonna Suzen Octave, MD   162 mg at 05/27/24 2218   calcium carbonate (TUMS - dosed in mg elemental calcium) chewable tablet 400 mg of elemental calcium  2 tablet Oral Q4H PRN Eldonna Suzen Octave, MD       enoxaparin  (LOVENOX ) injection 100 mg  100 mg Subcutaneous Q12H Eldonna Suzen Octave, MD   100 mg at 05/28/24 9164   ferrous sulfate  tablet 325 mg  325 mg Oral QODAY Newton, Kimberly Niles, MD       fluticasone  furoate-vilanterol (BREO ELLIPTA) 200-25 MCG/ACT 1 puff  1 puff Inhalation Daily Eldonna Suzen Octave, MD   1 puff at 05/28/24 0802   furosemide  (LASIX ) injection 40 mg  40 mg Intravenous Q12H Eldonna Suzen Octave, MD   40 mg at 05/27/24 2218   ipratropium-albuterol  (DUONEB) 0.5-2.5 (3) MG/3ML nebulizer solution 3 mL  3 mL Nebulization Q6H PRN Eldonna Suzen Octave, MD   3 mL at 05/28/24 0801   labetalol  (NORMODYNE ) tablet 200 mg  200 mg Oral BID Eldonna Suzen Octave, MD   200 mg at 05/27/24 2216   ondansetron  (ZOFRAN -ODT) disintegrating tablet 4 mg  4 mg Oral Q8H PRN Eldonna Suzen Octave, MD   4 mg at 05/28/24 0040   prenatal multivitamin tablet 1 tablet  1 tablet Oral Q1200 Eldonna Suzen Octave, MD       sodium chloride  flush (NS) 0.9 % injection 3 mL  3 mL Intravenous Q12H Izell Harari, MD   3 mL at 05/28/24 0840   sodium chloride  flush (NS) 0.9 % injection 3 mL  3 mL Intravenous PRN Izell Harari, MD       Labs:  Recent Labs  Lab 05/27/24 1618  WBC 11.5*  HGB 10.2*  HCT 32.2*  PLT 239   Recent Labs  Lab 05/27/24 1618 05/28/24 0540  NA 135 136  K 4.1 4.7  CL 102 102  CO2 24 29  BUN 12 10  CREATININE 0.67 0.78  CALCIUM 8.6* 8.7*  PROT 6.0*  --   BILITOT 0.2  --   ALKPHOS 57  --   ALT 13  --   AST 15  --   GLUCOSE 144* 121*   Radiology:  No new imaging  Assessment & Plan:  Patient stable *Pregnancy: will try for of fetal monitoring but can also do dopplers qshift. BPP  -12/2:  efw 60%, 865g, ac 57%, AFI wnl *CV: echo just done. Per sonographer, EF low normal in the  low 50s and descending aorta may be a little dilated. Follow up formal read. Will consult cardiology after results and also ask about switching from labetalol  to something that may cause less potential lung issues *Pulmonary, h/o asthma: recommend CT PE to patient, which she is amenable to. Consider hospitalist consult after scan *Bilateral LE edema: b/l LE dopplers ordered *Hernia: consider Gen Surg consult. 2022 CT showed large right spigelian hernia. Pt states that she has difficulty with doing an MRI *H/o DVT and protein S deficiency: see above. Pharmacy consult ordered given BMI *Preterm: no current issues *Elevated glucose: AM fasting and 2 hour post prandials ordered; early A1c normal and hasn't had GTT yet this pregnancy *PPx: lovenox , OOB ad lib *FEN/GI: regular diet *Social: SW consult ordered for housing insecurity.  *Dispo: pending work up.   Bebe Izell Raddle MD Attending Center for [facility] (Faculty Practice) GYN Consult Phone: [phone] (M-F, [phone]) & [phone]  (Off hours, weekends, holidays)
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
## 2024-05-28 NOTE — Progress Notes (Addendum)
 OB note Unable to tolerate CT PE due to anxiety, with ativan 2mg  PO pre med. Will try vq scan  D/w Hospitalist service and they will come see  Gen Surg contacted for consult given large spigelian hernia.   Bebe Izell Raddle MD Attending Center for [employer] (Faculty Practice) 05/28/2024 Time: 1445  D/w Dr. Ileana about his note stating lovenox  at therapeutic level back in October. He said that if she doesn't have a VTE, then prophylactic dosing is fine for her. Pharmacy aware  Bebe Izell Raddle MD Attending Center for [facility] (Faculty Practice) 05/28/2024 Time: [phone]

## 2024-05-28 NOTE — Plan of Care (Signed)
  Problem: Education: Goal: Knowledge of disease or condition will improve Outcome: Progressing Goal: Knowledge of the prescribed therapeutic regimen will improve Outcome: Progressing Goal: Individualized Educational Video(s) Outcome: Progressing   Problem: Clinical Measurements: Goal: Complications related to the disease process, condition or treatment will be avoided or minimized Outcome: Progressing   Problem: Education: Goal: Knowledge of General Education information will improve Description: Including pain rating scale, medication(s)/side effects and non-pharmacologic comfort measures Outcome: Progressing   Problem: Health Behavior/Discharge Planning: Goal: Ability to manage health-related needs will improve Outcome: Progressing   Problem: Clinical Measurements: Goal: Ability to maintain clinical measurements within normal limits will improve Outcome: Progressing Goal: Will remain free from infection Outcome: Progressing Goal: Diagnostic test results will improve Outcome: Progressing Goal: Respiratory complications will improve Outcome: Progressing Goal: Cardiovascular complication will be avoided Outcome: Progressing   Problem: Activity: Goal: Risk for activity intolerance will decrease Outcome: Progressing   Problem: Nutrition: Goal: Adequate nutrition will be maintained Outcome: Progressing   Problem: Coping: Goal: Level of anxiety will decrease Outcome: Progressing   Problem: Elimination: Goal: Will not experience complications related to bowel motility Outcome: Progressing Goal: Will not experience complications related to urinary retention Outcome: Progressing   Problem: Pain Managment: Goal: General experience of comfort will improve and/or be controlled Outcome: Progressing   Problem: Safety: Goal: Ability to remain free from injury will improve Outcome: Progressing   Problem: Skin Integrity: Goal: Risk for impaired skin integrity will  decrease Outcome: Progressing   Problem: Education: Goal: Ability to describe self-care measures that may prevent or decrease complications (Diabetes Survival Skills Education) will improve Outcome: Progressing Goal: Individualized Educational Video(s) Outcome: Progressing   Problem: Coping: Goal: Ability to adjust to condition or change in health will improve Outcome: Progressing   Problem: Fluid Volume: Goal: Ability to maintain a balanced intake and output will improve Outcome: Progressing   Problem: Health Behavior/Discharge Planning: Goal: Ability to identify and utilize available resources and services will improve Outcome: Progressing Goal: Ability to manage health-related needs will improve Outcome: Progressing   Problem: Metabolic: Goal: Ability to maintain appropriate glucose levels will improve Outcome: Progressing   Problem: Nutritional: Goal: Maintenance of adequate nutrition will improve Outcome: Progressing Goal: Progress toward achieving an optimal weight will improve Outcome: Progressing   Problem: Skin Integrity: Goal: Risk for impaired skin integrity will decrease Outcome: Progressing   Problem: Tissue Perfusion: Goal: Adequacy of tissue perfusion will improve Outcome: Progressing

## 2024-05-29 ENCOUNTER — Telehealth: Payer: Self-pay

## 2024-05-29 ENCOUNTER — Inpatient Hospital Stay (HOSPITAL_COMMUNITY)

## 2024-05-29 ENCOUNTER — Encounter: Admitting: Physician Assistant

## 2024-05-29 DIAGNOSIS — Z8679 Personal history of other diseases of the circulatory system: Secondary | ICD-10-CM

## 2024-05-29 DIAGNOSIS — O99891 Other specified diseases and conditions complicating pregnancy: Secondary | ICD-10-CM

## 2024-05-29 DIAGNOSIS — J218 Acute bronchiolitis due to other specified organisms: Secondary | ICD-10-CM

## 2024-05-29 DIAGNOSIS — B970 Adenovirus as the cause of diseases classified elsewhere: Secondary | ICD-10-CM

## 2024-05-29 DIAGNOSIS — O10919 Unspecified pre-existing hypertension complicating pregnancy, unspecified trimester: Secondary | ICD-10-CM

## 2024-05-29 LAB — BASIC METABOLIC PANEL WITH GFR
Anion gap: 8 (ref 5–15)
BUN: 13 mg/dL (ref 6–20)
CO2: 27 mmol/L (ref 22–32)
Calcium: 8.9 mg/dL (ref 8.9–10.3)
Chloride: 99 mmol/L (ref 98–111)
Creatinine, Ser: 0.66 mg/dL (ref 0.44–1.00)
GFR, Estimated: >60 mL/min (ref >60)
Glucose, Bld: 124 mg/dL — ABNORMAL HIGH (ref 70–99)
Potassium: 3.8 mmol/L (ref 3.5–5.1)
Sodium: 134 mmol/L — ABNORMAL LOW (ref 135–145)

## 2024-05-29 LAB — GLUCOSE, CAPILLARY
Glucose-Capillary: 72 mg/dL (ref 70–99)
Glucose-Capillary: 87 mg/dL (ref 70–99)

## 2024-05-29 MED ORDER — IOHEXOL 350 MG/ML SOLN
75.0000 mL | Freq: Once | INTRAVENOUS | Status: AC | PRN
  Administered 2024-05-29: 75 mL via INTRAVENOUS

## 2024-05-29 MED ORDER — HYDROXYZINE HCL 25 MG PO TABS: 25.0000 mg | ORAL_TABLET | Freq: Three times a day (TID) | ORAL | 0 refills | Status: DC | PRN

## 2024-05-29 MED ORDER — FUROSEMIDE 10 MG/ML IJ SOLN
40.0000 mg | Freq: Once | INTRAMUSCULAR | Status: AC
  Administered 2024-05-29: 40 mg via INTRAVENOUS
  Filled 2024-05-29: qty 4

## 2024-05-29 MED ORDER — HYDROXYZINE HCL 50 MG PO TABS
25.0000 mg | ORAL_TABLET | Freq: Three times a day (TID) | ORAL | Status: DC | PRN
  Administered 2024-05-29 (×2): 25 mg via ORAL
  Filled 2024-05-29 (×2): qty 1

## 2024-05-29 MED ORDER — OXYCODONE HCL 5 MG PO TABS: 5.0000 mg | ORAL_TABLET | ORAL | 0 refills | Status: DC | PRN

## 2024-05-29 NOTE — Assessment & Plan Note (Signed)
 Continue labetalol  200 mg twice daily Continue Lasix  Blood pressure is fairly well-controlled presently.

## 2024-05-29 NOTE — Progress Notes (Signed)
 Patient ID: Brooke Sandoval, female   DOB: 09/06/1985, 38 y.o.   MRN: [id number] FACULTY PRACTICE ANTEPARTUM(COMPREHENSIVE) NOTE  Brooke Sandoval is a 38 y.o. [id number] at [redacted]w[redacted]d by best clinical estimate who is admitted for shortness of breath.   Fetal presentation is unsure. Length of Stay:  2  Days  ASSESSMENT: Principal Problem:   Acute congestive heart failure (HCC) Active Problems:   Anxiety   Asthma affecting pregnancy, antepartum   BMI 50.0-59.9, adult (HCC)   Chronic hypertension during pregnancy, antepartum   Hx of pulmonary embolus during pregnancy   Protein S deficiency affecting pregnancy, antepartum   Advanced maternal age in multigravida   Irreducible Spigelian hernia   Pelvic adhesions   PLAN: Anxiety Will start Vistaril 25 mg 3 times daily as needed for anxiety  Asthma affecting pregnancy, antepartum Continue Breo Ellipta Continue DuoNebs  BMI 50.0-59.9, adult (HCC) May have some component of hypoventilation syndrome  Hx of pulmonary embolus during pregnancy Currently on treatment dose Lovenox  Patient admitted with shortness of breath would like to rule out PE prior to discharge. She has had a lot of anxiety about getting a CT angiogram and will try to get this done prior to discharge.  Protein S deficiency affecting pregnancy, antepartum Patient previously on intermittent twice daily dosing and will resume this if there is no evidence of acute PE.  Acute congestive heart failure (HCC) History of this and do not think this is contributing to her shortness of breath.  Patient has a normal BNP and a normal echo. Patient has received Lasix  and is almost usually at home.  Irreducible Spigelian hernia According to general surgery they believe the patient has a hernia from a prior Pfannenstiel incision and that this should be taken care of at a tertiary care center preferably after weight loss and after this pregnancy.  Pelvic adhesions Noted on last C-section.   Patient is a previous C-section x 5 with bowel injury at her last section. For all these reasons we will consider transfer to tertiary care center for delivery.  Chronic hypertension during pregnancy, antepartum Continue labetalol  200 mg twice daily Continue Lasix  Blood pressure is fairly well-controlled presently.  Advanced maternal age in multigravida Low risk female on NIPT   Subjective: Patient feels better today though she continues to complain of pain specifically at her hernia site. Patient reports the fetal movement as decreased . Patient reports uterine contraction  activity as none. Patient reports  vaginal bleeding as none. Patient describes fluid per vagina as None.  Vitals:  Blood pressure 132/67, pulse 94, temperature 97.6 F (36.4 C), temperature source Oral, resp. rate 19, height 5' 7 (1.702 m), weight (!) 178 kg, SpO2 95%. Physical Examination:  General appearance - alert, well appearing, and in no distress Chest - normal effort Abdomen - gravid, non-tender Fundal Height:  Unable to really feel due to body habitus Extremities: Homans sign is negative, no sign of DVT  Membranes:intact  Fetal Monitoring:  Positive but unable to obtain tracing Good fetal movement seen by MFM ultrasound  Labs:  Results for orders placed or performed during the hospital encounter of 05/27/24 (from the past 24 hours)  Low molecular wgt heparin (fractionated) (hepr anti-XA)   Collection Time: 05/28/24 12:27 PM  Result Value Ref Range   Heparin LMW 0.34 IU/mL  D-dimer, quantitative   Collection Time: 05/28/24  2:42 PM  Result Value Ref Range   D-Dimer, Quant 0.58 (H) 0.00 - 0.50 ug/mL-FEU  Respiratory (~20 pathogens) panel by  PCR   Collection Time: 05/28/24  5:28 PM   Specimen: Nasopharyngeal Swab; Respiratory  Result Value Ref Range   Adenovirus DETECTED (A) NOT DETECTED   Coronavirus 229E NOT DETECTED NOT DETECTED   Coronavirus HKU1 NOT DETECTED NOT DETECTED   Coronavirus  NL63 NOT DETECTED NOT DETECTED   Coronavirus OC43 NOT DETECTED NOT DETECTED   Metapneumovirus NOT DETECTED NOT DETECTED   Rhinovirus / Enterovirus NOT DETECTED NOT DETECTED   Influenza A NOT DETECTED NOT DETECTED   Influenza B NOT DETECTED NOT DETECTED   Parainfluenza Virus 1 NOT DETECTED NOT DETECTED   Parainfluenza Virus 2 NOT DETECTED NOT DETECTED   Parainfluenza Virus 3 NOT DETECTED NOT DETECTED   Parainfluenza Virus 4 NOT DETECTED NOT DETECTED   Respiratory Syncytial Virus NOT DETECTED NOT DETECTED   Bordetella pertussis NOT DETECTED NOT DETECTED   Bordetella Parapertussis NOT DETECTED NOT DETECTED   Chlamydophila pneumoniae NOT DETECTED NOT DETECTED   Mycoplasma pneumoniae NOT DETECTED NOT DETECTED  Basic metabolic panel   Collection Time: 05/29/24  4:50 AM  Result Value Ref Range   Sodium 134 (L) 135 - 145 mmol/L   Potassium 3.8 3.5 - 5.1 mmol/L   Chloride 99 98 - 111 mmol/L   CO2 27 22 - 32 mmol/L   Glucose, Bld 124 (H) 70 - 99 mg/dL   BUN 13 6 - 20 mg/dL   Creatinine, Ser 9.33 0.44 - 1.00 mg/dL   Calcium 8.9 8.9 - 89.6 mg/dL   GFR, Estimated >39 >39 mL/min   Anion gap 8 5 - 15  Glucose, capillary   Collection Time: 05/29/24  5:44 AM  Result Value Ref Range   Glucose-Capillary 72 70 - 99 mg/dL    Imaging Studies:    VAS US  LOWER EXTREMITY VENOUS (DVT) Result Date: 05/28/2024  Lower Venous DVT Study Patient Name:  Brooke Sandoval  Date of Exam:   05/28/2024 Medical Rec #: [id number]       Accession #:    [id number] Date of Birth: 1985-09-26        Patient Gender: F Patient Age:   38 years Exam Location:  [facility] Procedure:      VAS US  LOWER EXTREMITY VENOUS (DVT) Referring Phys: SUZEN Sandoval --------------------------------------------------------------------------------  Indications: Pulmonary embolism. Other Indications: [redacted] weeks pregnant. Anticoagulation: Lovenox . Comparison Study: Previous study on the left lower extremity on 10.27.2023. Performing  Technologist: Edilia Elden Appl  Examination Guidelines: A complete evaluation includes B-mode imaging, spectral Doppler, color Doppler, and power Doppler as needed of all accessible portions of each vessel. Bilateral testing is considered an integral part of a complete examination. Limited examinations for reoccurring indications may be performed as noted. The reflux portion of the exam is performed with the patient in reverse Trendelenburg.  +---------+---------------+---------+-----------+----------+----------------+ RIGHT    CompressibilityPhasicitySpontaneityPropertiesThrombus Aging   +---------+---------------+---------+-----------+----------+----------------+ CFV      Full           Yes      Yes                                   +---------+---------------+---------+-----------+----------+----------------+ SFJ      Full           Yes      Yes                                   +---------+---------------+---------+-----------+----------+----------------+  FV Prox  Full                                                          +---------+---------------+---------+-----------+----------+----------------+ FV Mid   Full                                                          +---------+---------------+---------+-----------+----------+----------------+ FV DistalFull                                                          +---------+---------------+---------+-----------+----------+----------------+ PFV      Full                                                          +---------+---------------+---------+-----------+----------+----------------+ POP      Full           Yes      Yes                                   +---------+---------------+---------+-----------+----------+----------------+ PTV                                                   Patent by color. +---------+---------------+---------+-----------+----------+----------------+ PERO                                                   Patent by color. +---------+---------------+---------+-----------+----------+----------------+   +---------+---------------+---------+-----------+----------+----------------+ LEFT     CompressibilityPhasicitySpontaneityPropertiesThrombus Aging   +---------+---------------+---------+-----------+----------+----------------+ CFV      Full           Yes      Yes                                   +---------+---------------+---------+-----------+----------+----------------+ SFJ      Full                                                          +---------+---------------+---------+-----------+----------+----------------+ FV Prox  Full                                                          +---------+---------------+---------+-----------+----------+----------------+  FV Mid   Full                                                          +---------+---------------+---------+-----------+----------+----------------+ FV DistalFull                                                          +---------+---------------+---------+-----------+----------+----------------+ PFV      Full                                                          +---------+---------------+---------+-----------+----------+----------------+ POP      Full           Yes      Yes                                   +---------+---------------+---------+-----------+----------+----------------+ PTV                                                   Patent by color. +---------+---------------+---------+-----------+----------+----------------+ PERO                                                  Patent by color. +---------+---------------+---------+-----------+----------+----------------+     Summary: RIGHT: - There is no evidence of deep vein thrombosis in the lower extremity. However, portions of this examination were limited- see technologist comments  above.  - No cystic structure found in the popliteal fossa.  LEFT: - There is no evidence of deep vein thrombosis in the lower extremity. However, portions of this examination were limited- see technologist comments above.  - No cystic structure found in the popliteal fossa.  *See table(s) above for measurements and observations. Electronically signed by Debby Robertson on 05/28/2024 at 4:06:56 PM.    Final    ECHOCARDIOGRAM COMPLETE Result Date: 05/28/2024    ECHOCARDIOGRAM REPORT   Patient Name:   Brooke Sandoval Date of Exam: 05/28/2024 Medical Rec #:  [id number]      Height:       67.0 in Accession #:    [id number]     Weight:       392.4 lb Date of Birth:  28-Oct-1985       BSA:          2.693 m Patient Age:    38 years       BP:           123/55 mmHg Patient Gender: F              HR:           96 bpm. Exam Location:  Inpatient  Procedure: 2D Echo, Cardiac Doppler and Color Doppler (Both Spectral and Color            Flow Doppler were utilized during procedure). Indications:    Congestive Heart Failure I50.9  History:        Patient has no prior history of Echocardiogram examinations.                 CHF.  Sonographer:    Jayson Gaskins Referring Phys: [id number] [facility] NILES Sandoval  Sonographer Comments: No Definity used due to pregnancy. IMPRESSIONS  1. Left ventricular ejection fraction, by estimation, is 55 to 60%. The left ventricle has normal function. Left ventricular endocardial border not optimally defined to evaluate regional wall motion. Left ventricular diastolic parameters were normal.  2. Right ventricular systolic function is normal. The right ventricular size is normal.  3. The mitral valve is grossly normal. Trivial mitral valve regurgitation.  4. The aortic valve was not well visualized. Aortic valve regurgitation is not visualized. Comparison(s): Technically difficutl evaluation but overall normal echocardiogram. FINDINGS  Left Ventricle: Left ventricular ejection fraction, by estimation, is 55 to  60%. The left ventricle has normal function. Left ventricular endocardial border not optimally defined to evaluate regional wall motion. The left ventricular internal cavity size was normal in size. There is borderline left ventricular hypertrophy. Left ventricular diastolic parameters were normal. Right Ventricle: The right ventricular size is normal. No increase in right ventricular wall thickness. Right ventricular systolic function is normal. Left Atrium: Left atrial size was normal in size. Right Atrium: Right atrial size was normal in size. Pericardium: There is no evidence of pericardial effusion. Presence of epicardial fat layer. Mitral Valve: The mitral valve is grossly normal. Trivial mitral valve regurgitation. Tricuspid Valve: The tricuspid valve is grossly normal. Tricuspid valve regurgitation is trivial. Aortic Valve: The aortic valve was not well visualized. Aortic valve regurgitation is not visualized. Aortic valve mean gradient measures 5.0 mmHg. Aortic valve peak gradient measures 8.3 mmHg. Aortic valve area, by VTI measures 3.37 cm. Pulmonic Valve: The pulmonic valve was not well visualized. Pulmonic valve regurgitation is not visualized. Aorta: The aortic root and ascending aorta are structurally normal, with no evidence of dilitation. IAS/Shunts: The interatrial septum was not well visualized.  LEFT VENTRICLE PLAX 2D LVIDd:         4.20 cm   Diastology LVIDs:         3.40 cm   LV e' medial:    7.40 cm/s LV PW:         1.20 cm   LV E/e' medial:  10.6 LV IVS:        0.90 cm   LV e' lateral:   11.20 cm/s LVOT diam:     2.00 cm   LV E/e' lateral: 7.0 LV SV:         89 LV SV Index:   33 LVOT Area:     3.14 cm  RIGHT VENTRICLE RV S prime:     13.40 cm/s TAPSE (M-mode): 2.4 cm LEFT ATRIUM             Index        RIGHT ATRIUM           Index LA Vol (A2C):   35.9 ml 13.33 ml/m  RA Area:     10.10 cm LA Vol (A4C):   51.1 ml 18.97 ml/m  RA Volume:   19.80 ml  7.35 ml/m LA Biplane Vol: 42.7 ml 15.86  ml/m  AORTIC VALVE AV Area (Vmax):    2.90 cm AV Area (Vmean):   2.92 cm AV Area (VTI):     3.37 cm AV Vmax:           144.00 cm/s AV Vmean:          112.000 cm/s AV VTI:            0.264 m AV Peak Grad:      8.3 mmHg AV Mean Grad:      5.0 mmHg LVOT Vmax:         133.00 cm/s LVOT Vmean:        104.000 cm/s LVOT VTI:          0.283 m LVOT/AV VTI ratio: 1.07  AORTA Ao Root diam: 2.70 cm Ao Asc diam:  3.70 cm MITRAL VALVE MV Area (PHT): 3.21 cm    SHUNTS MV Decel Time: 236 msec    Systemic VTI:  0.28 m MV E velocity: 78.10 cm/s  Systemic Diam: 2.00 cm MV A velocity: 91.20 cm/s MV E/A ratio:  0.86 Brooke Sandoval Electronically signed by Joelle Cedars Sandoval Signature Date/Time: 05/28/2024/9:32:56 AM    Final    DG Chest Portable 1 View Result Date: 05/27/2024 CLINICAL DATA:  Shortness of breath.  Cough. EXAM: PORTABLE CHEST 1 VIEW COMPARISON:  Radiograph 05/15/2024 FINDINGS: The cardiomediastinal contours are normal. The lungs are clear. Pulmonary vasculature is normal. No consolidation, pleural effusion, or pneumothorax. No acute osseous abnormalities are seen. IMPRESSION: No acute findings. Electronically Signed   By: Andrea Gasman M.D.   On: 05/27/2024 19:47   US  MFM OB FOLLOW UP Result Date: 05/27/2024 ----------------------------------------------------------------------  OBSTETRICS REPORT                       (Signed Final 05/27/2024 02:43 pm) ---------------------------------------------------------------------- Patient Info  ID #:       [id number]                          D.O.B.:  March 26, 1986 (38 yrs)(F)  Name:       Brooke Sandoval                  Visit Date: 05/27/2024 02:04 pm ---------------------------------------------------------------------- Performed By  Attending:        Nathanel Fetters      Ref. Address:     [address]                    [place]                                                             [place], [place]                                                             [address]   Performed By:     Meade Parker       [facility]  Referred By:      [facility] ---------------------------------------------------------------------- Orders  #  Description                           Code        Ordered By  1  US  MFM OB FOLLOW UP                   [phone]    BABARA KEYS ----------------------------------------------------------------------  #  Order #                     Accession #                Episode #  1  [id number]                   [id number]                 [id number] ---------------------------------------------------------------------- Indications  [redacted] weeks gestation of pregnancy                Z3A.25  Abdominal pain in pregnancy                    O99.89  Obesity complicating pregnancy, second         O99.212  trimester (BMI 78)  Advanced maternal age multigravida 7+,        O54.522  second trimester (38 yrs)  Previous cesarean delivery, antepartum (x5)    O53.219  Grand multiparity, antepartum                  O09.40  Poor obstetric history: Previous preterm       O09.219  delivery, antepartum (No term deliveries)  Pre-existing essential hypertension            O10.012  complicating pregnancy, second trimester  Medical complication of pregnancy (Hepatic     O26.90  steatosis, ventral hernia)(Hx of DVT, SUD,  CHF [Lasix ], bowel injury)  Poor obstetric history: Previous               O09.299  preeclampsia, GDM, PE (on Lovenox ),  HELLP  Protein S deficiency complicating pregnancy    O99.199  Poor obstetric history: Previous IUFD          O42.299  (stillbirth) x2, 24w 2007, 24w 2011)  Abnormal chromosomal and genetic  finding       O28.5  on antenatal screening of mother (Low fetal  fraction on Panorama, LOW RISK [facility]   NIPS)  Encounter for other antenatal screening        Z36.2  follow-up  Obesity complicating pregnancy, second         O99.212  trimester (BMI 57)  Encounter for other antenatal screening        Z36.2  follow-up ---------------------------------------------------------------------- Fetal Evaluation  Num Of Fetuses:         1  Cardiac Activity:       Observed  Presentation:           Transverse, head to maternal left  Placenta:               Anterior  P. Cord Insertion:      Previously seen  Amniotic Fluid  AFI FV:      Within normal limits                              Largest Pocket(cm)                              5.28 ---------------------------------------------------------------------- Biometry  BPD:      66.2  mm     G. Age:  26w 5d         82  %    CI:        80.28   %    70 - 86                                                          FL/HC:      20.3   %    18.7 - 20.3  HC:      233.4  mm     G. Age:  25w 3d         25  %    HC/AC:      1.09        1.04 - 1.22  AC:      214.3  mm     G. Age:  26w 0d         57  %    FL/BPD:     71.6   %    71 - 87  FL:       47.4  mm     G. Age:  25w 6d         49  %    FL/AC:      22.1   %    20 - 24  CER:      26.6  mm     G. Age:  23w 6d         14  %  LV:        4.8  mm  CM:        4.9  mm  Est. FW:     865  gm    1 lb 15 oz      60  % ---------------------------------------------------------------------- OB History  Gravidity:    9         Term:   0  Prem:   7        SAB:   1  TOP:          0       Ectopic:  0        Living: 5 ---------------------------------------------------------------------- Gestational Age  U/S Today:     26w 0d                                        EDD:   09/02/24  Best:          25w 3d     Det. By:  Early Ultrasound         EDD:   09/06/24                                      (01/22/24) ---------------------------------------------------------------------- Targeted Anatomy  Central Nervous System  Calvarium/Cranial V.:  Appears normal          Cereb./Vermis:          Not well visualized  Cavum:                 Appears normal         Cisterna Magna:         Not well visualized  Lateral Ventricles:    Appears normal         Midline Falx:           Appears normal  Choroid Plexus:        Not well visualized  Spine  Cervical:              Not well visualized    Sacral:                 Not well visualized  Thoracic:              Not well visualized    Shape/Curvature:        Not well visualized  Lumbar:                Not well visualized  Head/Neck  Lips:                  Not well visualized    Profile:                Appears normal  Neck:                  Not well visualized    Orbits/Eyes:            Lenses nwv  Nuchal Fold:           Not applicable         Mandible:               Not well visualized  Nasal Bone:            Present                Maxilla:                Not well visualized  Thorax  4 Chamber View:        Appears normal         Interventr. Septum:     Appears normal  Cardiac Rhythm:  Normal                 Cardiac Axis:           Appears normal  Cardiac Situs:         Appears normal         Diaphragm:              Appears normal  Rt Outflow Tract:      Not well visualized    3 Vessel View:          Appears normal  Lt Outflow Tract:      Appears normal         3 V Trachea View:       Not well visualized  Aortic Arch:           Not well visualized    IVC:                    Appears normal  Ductal Arch:           Previously seen        Crossing:               Appears normal  SVC:                   Appears normal  Abdomen  Ventral Wall:          Appears normal         Lt Kidney:              Not well visualized  Cord Insertion:        Appears normal         Rt Kidney:              Not well visualized  Situs:                 Appears normal         Bladder:                Previously seen  Stomach:               Appears normal  Extremities  Lt Humerus:            Previously seen        Lt Femur:               Previously seen  Rt Humerus:             Previously seen        Rt Femur:               Previously seen  Lt Forearm:            Previously seen        Lt Lower Leg:           Previously seen  Rt Forearm:            Previously seen        Rt Lower Leg:           Previously seen  Lt Hand:               Previously seen        Lt Foot:                limited lateral view  Rt Hand:               Not  well visualized    Rt Foot:                Not well visualized  Other  Umbilical Cord:        Previously seen        Genitalia:              Female prev seen  Comment:     Technically difficult due to hernia in maternal RLQ, BMI 57, and               fetal position. ---------------------------------------------------------------------- Cervix Uterus Adnexa  Cervix  Not visualized (advanced GA >24wks)  Uterus  No abnormality visualized.  Right Ovary  Not visualized.  Left Ovary  Not visualized.  Cul De Sac  No free fluid seen.  Adnexa  No abnormality visualized ---------------------------------------------------------------------- Impression  MFM Consultation  Brooke Sandoval is a 38 yo G9P5 at 49 w 3 d with an EDD of  09/06/24.  She is here for follow up growth exam.  She has multiple issue complicating her pregnancy  Most notably  CHTN  BMI 50  CHF managed with Lasix   H/o DVT on Lovenox .  She denies s/sx of PTL or Preeclampsia however, she  complains of worsening SOB and coughing at night when she  lays down. She says she cannot sleep at night either.  She has a f/u maternal echo today and scheduled to see  cardiology on Friday.           05/27/2024    1:17 PM       05/15/2024    9:30 PM  PM       05/15/2024    5:49 PM  Vitals with BMI  Systolic 136      124      145  Diastolic92       69       82  Pulse             89       95  Imaging:  SIUP with measurements consistent with dates  Good fetal movement and amniotic fluid again observed  Suboptimal views of the fetal anatomy was again seen  secondary to fetal position and maternal habitus.  Impression/Counseling:  1)  CHF- I discussed with Brooke Sandoval her symptoms and  recommended she go to [facility] for further evaluation after her  echo (they will likely want to get one anyway).  May need additional Lasix . Will see after echocardiogram.  Consider consulting cards if admitted.  BNP on 11/20 was 20.  2) CHTN-BP stable on Labetalol   3) h/o PE on Lovenox .  -Needs AntiXa assessment again.  Repeat growth scheduled in 3-4 weeks.  I spent 30 minutes with > 50% in face to face consultation  Nathanel DOROTHA Fetters, MD ----------------------------------------------------------------------              Nathanel Fetters, MD Electronically Signed Final Report   05/27/2024 02:43 pm ----------------------------------------------------------------------   US  MFM OB LIMITED Result Date: 05/16/2024 ----------------------------------------------------------------------  OBSTETRICS REPORT                        (Signed Final 05/16/2024 06:19 am) ---------------------------------------------------------------------- Patient Info  ID #:       [id number]                          D.O.B.:  July 05, 1985 (38 yrs)(F)  Name:  Brooke Sandoval                  Visit Date: 05/15/2024 07:37 pm ---------------------------------------------------------------------- Performed By  Attending:        Steffan Keys MD         Ref. Address:      [address]                                                              [place], [place]                                                              [address]  Performed By:     Jonette Nap        [facility]  Referred By:      [facility] ---------------------------------------------------------------------- Orders  #  Description                           Code        Ordered By  1  US  MFM OB LIMITED                     23184.98    GLENYS BIRK  ----------------------------------------------------------------------  #  Order #                     Accession #                Episode #  1  [id number]                   [id number]                 [id number] ---------------------------------------------------------------------- Indications  Unable to hear fetal heart tones as reason      O76  for ultrasound  Abdominal pain in pregnancy                     O99.89  Obesity complicating pregnancy, second          O99.212  trimester (BMI 57)  Advanced maternal age multigravida 2+,         O3.522  second trimester (38 yrs)  Previous cesarean delivery, antepartum (x5)     O50.219  Grand multiparity, antepartum                   O09.40  Poor obstetric history: Previous preterm        O09.219  delivery, antepartum (No term deliveries)  Pre-existing  essential hypertension             O10.012  complicating pregnancy, second trimester  Medical complication of pregnancy (Hepatic      O26.90  steatosis, ventral hernia)(Hx of DVT, SUD,  CHF [Lasix ], bowel injury)  Poor obstetric history: Previous                O09.299  preeclampsia, GDM, PE (on Lovenox ),  HELLP  Protein S deficiency complicating pregnancy     O99.199  Poor obstetric history: Previous IUFD           O68.299  (stillbirth) x2, 24w 2007, 24w 2011)  [redacted] weeks gestation of pregnancy                 Z3A.23  Abnormal chromosomal and genetic finding        O28.5  on antenatal screening of mother (Low fetal  fraction on Panorama, LOW RISK [facility])  LR [facility] - Female  Encounter for other antenatal screening         Z36.2  follow-up ---------------------------------------------------------------------- Fetal Evaluation  Num Of Fetuses:          1  Fetal Heart Rate(bpm):   144  Cardiac Activity:        Observed  Presentation:            Transverse, head to maternal right  Placenta:                Anterior  Amniotic Fluid  AFI FV:      Within normal limits                              Largest Pocket(cm)                               3.7 ---------------------------------------------------------------------- OB History  Gravidity:    9         Term:   0        Prem:   7        SAB:   1  TOP:          0       Ectopic:  0        Living: 5 ---------------------------------------------------------------------- Gestational Age  Best:          23w 5d     Det. By:  Early Ultrasound         EDD:   09/06/24                                      (01/22/24) ---------------------------------------------------------------------- Anatomy  Heart:                 Visualized             Bladder:                Appears normal  Stomach:               Appears normal, left                         sided ---------------------------------------------------------------------- Comments  This patient presented to the MAU due to abdominal pain and  shortness of breath. The fetal heart tones could not be  found  in the MAU due to maternal body habitus.  A limited ultrasound performed today shows that the fetus is  in the transverse presentation.  There was normal amniotic fluid noted.  The fetal heart rate was 144 beats a minute. ----------------------------------------------------------------------                   Steffan Keys, MD Electronically Signed Final Report   05/16/2024 06:19 am ----------------------------------------------------------------------   DG Abd Portable 1 View Result Date: 05/15/2024 CLINICAL DATA:  Abdominal pain. EXAM: PORTABLE ABDOMEN - 1 VIEW COMPARISON:  None Available. FINDINGS: The bowel gas pattern is normal. No radio-opaque calculi or other significant radiographic abnormality are seen. IMPRESSION: Negative. Electronically Signed   By: Vanetta Chou M.D.   On: 05/15/2024 19:09   DG Chest Port 1V same Day Result Date: 05/15/2024 CLINICAL DATA:  Shortness of breath EXAM: PORTABLE CHEST 1 VIEW COMPARISON:  Chest x-ray 12/17/2023 FINDINGS: The heart size and mediastinal contours are within normal limits. Both lungs are  clear. The visualized skeletal structures are unremarkable. IMPRESSION: No active disease. Electronically Signed   By: Greig Pique M.D.   On: 05/15/2024 19:07     Medications:  Scheduled  aspirin   162 mg Oral Daily   enoxaparin  (LOVENOX ) injection  100 mg Subcutaneous Q12H   And   enoxaparin  (LOVENOX ) injection  80 mg Subcutaneous Q12H   ferrous sulfate   325 mg Oral QODAY   fluticasone  furoate-vilanterol  1 puff Inhalation Daily   furosemide   40 mg Intravenous Q12H   labetalol   200 mg Oral BID   polyethylene glycol  17 g Oral Daily   prenatal multivitamin  1 tablet Oral Q1200   sodium chloride  flush  3 mL Intravenous Q12H   I have reviewed the patient's current medications.   Glenys GORMAN Birk, MD 05/29/2024,11:19 AM

## 2024-05-29 NOTE — Assessment & Plan Note (Signed)
 May have some component of hypoventilation syndrome

## 2024-05-29 NOTE — Assessment & Plan Note (Signed)
 Noted on last C-section.  Patient is a previous C-section x 5 with bowel injury at her last section. For all these reasons we will consider transfer to tertiary care center for delivery.

## 2024-05-29 NOTE — Assessment & Plan Note (Signed)
 According to general surgery they believe the patient has a hernia from a prior Pfannenstiel incision and that this should be taken care of at a tertiary care center preferably after weight loss and after this pregnancy.

## 2024-05-29 NOTE — Progress Notes (Unsigned)
 Patient did not show for appointment to day, as she is hospitalized.

## 2024-05-29 NOTE — Assessment & Plan Note (Signed)
 History of this and do not think this is contributing to her shortness of breath.  Patient has a normal BNP and a normal echo. Patient has received Lasix  and is almost usually at home.

## 2024-05-29 NOTE — Assessment & Plan Note (Signed)
 Currently on treatment dose Lovenox  Patient admitted with shortness of breath would like to rule out PE prior to discharge. She has had a lot of anxiety about getting a CT angiogram and will try to get this done prior to discharge.

## 2024-05-29 NOTE — Progress Notes (Signed)
 PROGRESS NOTE  Amarys Sliwinski FMW:[id number] DOB: 01-14-1986 DOA: 05/27/2024 PCP: Patient, No Pcp Per  HPI/Recap of past 24 hours: Beonca Gibb is a 38 y.o. female with medical history significant of obesity, anxiety, asthma, protein S deficiency, DVT/PE, CHF presented with X 2 days of SOB, orthopnea, chronic cough, BLE edema, admitted by OB/GYN service as she is currently 25W gestation. CXR unremarkable, BNP and troponin WNL.  Patient with a history of PE, unable to tolerate CTA due to significant anxiety.  Respiratory viral panel positive for adenovirus.   Today, patient complained of abdominal discomfort, states that is from her hernia.  Still reports some cough/congestion, orthopnea/dyspnea.  Denies any chest pain, nausea vomiting, fever/chills.    Assessment/Plan: Principal Problem:   Acute congestive heart failure (HCC) Active Problems:   Anxiety   Asthma affecting pregnancy, antepartum   BMI 50.0-59.9, adult (HCC)   Chronic hypertension during pregnancy, antepartum   Hx of pulmonary embolus during pregnancy   Protein S deficiency affecting pregnancy, antepartum   Advanced maternal age in multigravida   Irreducible Spigelian hernia   Pelvic adhesions   Possible acute bronchitis/asthma exacerbation from adenovirus ?Acute CHF versus asthma exacerbation Vs obesity hypoventilation syndrome Rule out PE Possibly multifactorial, currently exacerbated by adenovirus infection Currently on room air, saturating well Still reports cough, subjective dyspnea, ??orthopnea Respiratory viral panel positive for adenovirus D-dimer mildly elevated Bilateral lower extremity Doppler negative BNP WNL, could be falsely low in the setting of morbid obesity Chest x-ray unremarkable Echo showed EF of 55 to 60%, unable to evaluate for regional wall motion abnormality, left ventricular diastolic parameters normal Recommend CTA chest to rule out both possible superimposed pneumonia as well as PE  (discussed with patient, willing to undergo CTA chest with antianxiety meds) Primary team have reached out to cardiology, appreciate recs Continue with Lovenox  Continue DuoNeb and Breo, consider adding tapered dose of steroids Supplemental O2 as needed   Other medical problems per primary team     [facility] will continue to follow the patient.    Estimated body mass index is 61.46 kg/m as calculated from the following:   Height as of this encounter: 5' 7 (1.702 m).   Weight as of this encounter: 178 kg.     Code Status: Full  Family Communication: None at bedside  Disposition Plan: Status is: Inpatient Remains inpatient appropriate because: Level of care      Consultants: [facility] hospital[facility]  Procedures: None  Antimicrobials: None  DVT prophylaxis: Lovenox    Objective: Vitals:   05/29/24 0330 05/29/24 0450 05/29/24 0902 05/29/24 1217  BP: (!) 119/90  132/67 (!) 140/89  Pulse: 91  94 91  Resp: 19  19 20   Temp: 99 F (37.2 C)  97.6 F (36.4 C) 97.7 F (36.5 C)  TempSrc: Oral  Oral Oral  SpO2: (!) 88% 92% 95% 93%  Weight:      Height:        Intake/Output Summary (Last 24 hours) at 05/29/2024 1452 Last data filed at 05/29/2024 0430 Gross per 24 hour  Intake 2310 ml  Output 1450 ml  Net 860 ml   Filed Weights   05/27/24 1545  Weight: (!) 178 kg    Exam: General: NAD, morbidly obese Cardiovascular: S1, S2 present Respiratory: Diminished breath sound worse on the right Abdomen: Soft, +tender, nondistended, bowel sounds present, gravid abdomen Musculoskeletal: No bilateral pedal edema noted Skin: Normal Psychiatry: Normal mood     Data Reviewed: CBC: Recent Labs  Lab 05/27/24 1618  WBC 11.5*  HGB 10.2*  HCT 32.2*  MCV 86.3  PLT 239   Basic Metabolic Panel: Recent Labs  Lab 05/27/24 1618 05/28/24 0540 05/29/24 0450  NA 135 136 134*  K 4.1 4.7 3.8  CL 102 102 99  CO2 24 29 27   GLUCOSE 144* 121* 124*  BUN 12 10 13   CREATININE  0.67 0.78 0.66  CALCIUM  8.6* 8.7* 8.9   GFR: Estimated Creatinine Clearance: 159.7 mL/min (by C-G formula based on SCr of 0.66 mg/dL). Liver Function Tests: Recent Labs  Lab 05/27/24 1618  AST 15  ALT 13  ALKPHOS 57  BILITOT 0.2  PROT 6.0*  ALBUMIN 2.3*   No results for input(s): LIPASE, AMYLASE in the last 168 hours. No results for input(s): AMMONIA in the last 168 hours. Coagulation Profile: No results for input(s): INR, PROTIME in the last 168 hours. Cardiac Enzymes: No results for input(s): CKTOTAL, CKMB, CKMBINDEX, TROPONINI in the last 168 hours. BNP (last 3 results) No results for input(s): PROBNP in the last 8760 hours. HbA1C: No results for input(s): HGBA1C in the last 72 hours. CBG: Recent Labs  Lab 05/28/24 0755 05/28/24 1103 05/29/24 0544  GLUCAP 102* 146* 72   Lipid Profile: No results for input(s): CHOL, HDL, LDLCALC, TRIG, CHOLHDL, LDLDIRECT in the last 72 hours. Thyroid Function Tests: No results for input(s): TSH, T4TOTAL, FREET4, T3FREE, THYROIDAB in the last 72 hours. Anemia Panel: No results for input(s): VITAMINB12, FOLATE, FERRITIN, TIBC, IRON , RETICCTPCT in the last 72 hours. Urine analysis:    Component Value Date/Time   COLORURINE AMBER (A) 04/27/2024 1326   APPEARANCEUR CLOUDY (A) 04/27/2024 1326   LABSPEC 1.025 04/27/2024 1326   PHURINE 5.0 04/27/2024 1326   GLUCOSEU NEGATIVE 04/27/2024 1326   HGBUR NEGATIVE 04/27/2024 1326   BILIRUBINUR NEGATIVE 04/27/2024 1326   KETONESUR NEGATIVE 04/27/2024 1326   PROTEINUR 30 (A) 04/27/2024 1326   UROBILINOGEN 1.0 03/20/2024 1401   NITRITE NEGATIVE 04/27/2024 1326   LEUKOCYTESUR NEGATIVE 04/27/2024 1326   Sepsis Labs: @LABRCNTIP (procalcitonin:4,lacticidven:4)  ) Recent Results (from the past 240 hours)  Respiratory (~20 pathogens) panel by PCR     Status: Abnormal   Collection Time: 05/28/24  5:28 PM   Specimen: Nasopharyngeal Swab;  Respiratory  Result Value Ref Range Status   Adenovirus DETECTED (A) NOT DETECTED Final   Coronavirus 229E NOT DETECTED NOT DETECTED Final    Comment: (NOTE) The Coronavirus on the Respiratory Panel, DOES NOT test for the novel  Coronavirus (2019 nCoV)    Coronavirus HKU1 NOT DETECTED NOT DETECTED Final   Coronavirus NL63 NOT DETECTED NOT DETECTED Final   Coronavirus OC43 NOT DETECTED NOT DETECTED Final   Metapneumovirus NOT DETECTED NOT DETECTED Final   Rhinovirus / Enterovirus NOT DETECTED NOT DETECTED Final   Influenza A NOT DETECTED NOT DETECTED Final   Influenza B NOT DETECTED NOT DETECTED Final   Parainfluenza Virus 1 NOT DETECTED NOT DETECTED Final   Parainfluenza Virus 2 NOT DETECTED NOT DETECTED Final   Parainfluenza Virus 3 NOT DETECTED NOT DETECTED Final   Parainfluenza Virus 4 NOT DETECTED NOT DETECTED Final   Respiratory Syncytial Virus NOT DETECTED NOT DETECTED Final   Bordetella pertussis NOT DETECTED NOT DETECTED Final   Bordetella Parapertussis NOT DETECTED NOT DETECTED Final   Chlamydophila pneumoniae NOT DETECTED NOT DETECTED Final   Mycoplasma pneumoniae NOT DETECTED NOT DETECTED Final    Comment: Performed at [facility], [address] [place], [place] [address]  Studies: No results found.  Scheduled Meds:  aspirin   162 mg Oral Daily   enoxaparin  (LOVENOX ) injection  100 mg Subcutaneous Q12H   And   enoxaparin  (LOVENOX ) injection  80 mg Subcutaneous Q12H   ferrous sulfate   325 mg Oral QODAY   fluticasone  furoate-vilanterol  1 puff Inhalation Daily   labetalol   200 mg Oral BID   polyethylene glycol  17 g Oral Daily   prenatal multivitamin  1 tablet Oral Q1200   sodium chloride  flush  3 mL Intravenous Q12H    Continuous Infusions:   LOS: 2 days     Lebron JINNY Cage, MD [facility]  If 7PM-7AM, please contact night-coverage [email or web address] 05/29/2024, 2:52 PM

## 2024-05-29 NOTE — Assessment & Plan Note (Signed)
 Continue Breo Ellipta Continue DuoNebs

## 2024-05-29 NOTE — Discharge Summary (Signed)
 Antenatal Physician Discharge Summary  Patient ID: Brooke Sandoval MRN: [id number] DOB/AGE: 1986-06-26 38 y.o.  Admit date: 05/27/2024 Discharge date: 05/29/2024  Admission Diagnoses: shortness of breath, complex pregnancy with multiple comorbidities  Discharge Diagnoses: Same but improved  Prenatal Procedures: NST and CT of chest/abdomen  Consults: hospitalist, cardiology  Hospital Course:  Brooke Sandoval is a 38 y.o. [id number] with IUP at [redacted]w[redacted]d admitted for increased shortness of breath and asthma exacerbation.  Pt was admitted for control of breathing and to evaluate for possible worsening of chronic CHF.  Pt received an echo and lower extremity dopplers along with chest CT.   No evidence of large PE noted on scan. Abdominal CT showed known large abdominal hernia.  She was deemed stable for discharge to home with outpatient follow up.  Of note, surgical term recommended transfer to tertiary care center as they felt uncomfortable managing the hernia at this center.  Discharge Exam: Temp:  [97.6 F (36.4 C)-99 F (37.2 C)] 98.6 F (37 C) (12/04 1503) Pulse Rate:  [86-103] 103 (12/04 1503) Resp:  [19-20] 19 (12/04 1503) BP: (107-145)/(44-90) 135/49 (12/04 1503) SpO2:  [88 %-100 %] 100 % (12/04 1503) Physical Examination: CONSTITUTIONAL: Well-developed, morbidly obese, well-nourished female in no acute distress.  HENT:  Normocephalic, atraumatic, External right and left ear normal. Oropharynx is clear and moist EYES: Conjunctivae and EOM are normal.  NECK: Normal range of motion, supple, no masses SKIN: Skin is warm and dry. No rash noted. Not diaphoretic. No erythema. No pallor. NEUROLGIC: Grossly intact, Alert and oriented to person, place, and time. Normal reflexes, muscle tone coordination. No cranial nerve deficit noted. PSYCHIATRIC: Normal mood and affect. Normal behavior. Normal judgment and thought content. CARDIOVASCULAR: Normal heart rate noted, regular rhythm RESPIRATORY:  Effort and breath sounds with mild wheezing, no obvious distress noted MUSCULOSKELETAL: Normal range of motion. No edema and no tenderness. 2+ distal pulses. ABDOMEN: Soft,  mildly tender, nondistended, gravid. Obese, large hernia noted CERVIX:  deferred  Significant Diagnostic Studies:  Results for orders placed or performed during the hospital encounter of 05/27/24 (from the past week)  Type and screen [facility]   Collection Time: 05/27/24  3:10 PM  Result Value Ref Range   ABO/RH(D) A POS    Antibody Screen NEG    Sample Expiration      05/30/2024,2359 Performed at [facility], [address] [place], [place] [address]   Comprehensive metabolic panel   Collection Time: 05/27/24  4:18 PM  Result Value Ref Range   Sodium 135 135 - 145 mmol/L   Potassium 4.1 3.5 - 5.1 mmol/L   Chloride 102 98 - 111 mmol/L   CO2 24 22 - 32 mmol/L   Glucose, Bld 144 (H) 70 - 99 mg/dL   BUN 12 6 - 20 mg/dL   Creatinine, Ser 9.32 0.44 - 1.00 mg/dL   Calcium  8.6 (L) 8.9 - 10.3 mg/dL   Total Protein 6.0 (L) 6.5 - 8.1 g/dL   Albumin 2.3 (L) 3.5 - 5.0 g/dL   AST 15 15 - 41 U/L   ALT 13 0 - 44 U/L   Alkaline Phosphatase 57 38 - 126 U/L   Total Bilirubin 0.2 0.0 - 1.2 mg/dL   GFR, Estimated >39 >39 mL/min   Anion gap 9 5 - 15  CBC   Collection Time: 05/27/24  4:18 PM  Result Value Ref Range   WBC 11.5 (H) 4.0 - 10.5 K/uL   RBC 3.73 (L) 3.87 -  5.11 MIL/uL   Hemoglobin 10.2 (L) 12.0 - 15.0 g/dL   HCT 67.7 (L) 63.9 - 53.9 %   MCV 86.3 80.0 - 100.0 fL   MCH 27.3 26.0 - 34.0 pg   MCHC 31.7 30.0 - 36.0 g/dL   RDW 82.9 (H) 88.4 - 84.4 %   Platelets 239 150 - 400 K/uL   nRBC 0.0 0.0 - 0.2 %  Brain natriuretic peptide   Collection Time: 05/27/24  4:18 PM  Result Value Ref Range   B Natriuretic Peptide 25.2 0.0 - 100.0 pg/mL  Troponin I (High Sensitivity)   Collection Time: 05/27/24  4:18 PM  Result Value Ref Range   Troponin I (High Sensitivity) 6 <18 ng/L  Protein /  creatinine ratio, urine   Collection Time: 05/27/24  7:01 PM  Result Value Ref Range   Creatinine, Urine 218 mg/dL   Total Protein, Urine 23 mg/dL   Protein Creatinine Ratio 0.11 0.00 - 0.15 mg/mg[Cre]  Basic metabolic panel   Collection Time: 05/28/24  5:40 AM  Result Value Ref Range   Sodium 136 135 - 145 mmol/L   Potassium 4.7 3.5 - 5.1 mmol/L   Chloride 102 98 - 111 mmol/L   CO2 29 22 - 32 mmol/L   Glucose, Bld 121 (H) 70 - 99 mg/dL   BUN 10 6 - 20 mg/dL   Creatinine, Ser 9.21 0.44 - 1.00 mg/dL   Calcium  8.7 (L) 8.9 - 10.3 mg/dL   GFR, Estimated >39 >39 mL/min   Anion gap 5 5 - 15  Glucose, capillary   Collection Time: 05/28/24  7:55 AM  Result Value Ref Range   Glucose-Capillary 102 (H) 70 - 99 mg/dL  ECHOCARDIOGRAM COMPLETE   Collection Time: 05/28/24  8:37 AM  Result Value Ref Range   Weight 6,278.7 oz   Height 67 in   BP 121/55 mmHg   S' Lateral 3.40 cm   AR max vel 2.90 cm2   AV Area VTI 3.37 cm2   AV Mean grad 5.0 mmHg   AV Peak grad 8.3 mmHg   Ao pk vel 1.44 m/s   Area-P 1/2 3.21 cm2   AV Area mean vel 2.92 cm2   Est EF 55 - 60%   Glucose, capillary   Collection Time: 05/28/24 11:03 AM  Result Value Ref Range   Glucose-Capillary 146 (H) 70 - 99 mg/dL  Low molecular wgt heparin (fractionated) (hepr anti-XA)   Collection Time: 05/28/24 12:27 PM  Result Value Ref Range   Heparin LMW 0.34 IU/mL  D-dimer, quantitative   Collection Time: 05/28/24  2:42 PM  Result Value Ref Range   D-Dimer, Quant 0.58 (H) 0.00 - 0.50 ug/mL-FEU  Respiratory (~20 pathogens) panel by PCR   Collection Time: 05/28/24  5:28 PM   Specimen: Nasopharyngeal Swab; Respiratory  Result Value Ref Range   Adenovirus DETECTED (A) NOT DETECTED   Coronavirus 229E NOT DETECTED NOT DETECTED   Coronavirus HKU1 NOT DETECTED NOT DETECTED   Coronavirus NL63 NOT DETECTED NOT DETECTED   Coronavirus OC43 NOT DETECTED NOT DETECTED   Metapneumovirus NOT DETECTED NOT DETECTED   Rhinovirus /  Enterovirus NOT DETECTED NOT DETECTED   Influenza A NOT DETECTED NOT DETECTED   Influenza B NOT DETECTED NOT DETECTED   Parainfluenza Virus 1 NOT DETECTED NOT DETECTED   Parainfluenza Virus 2 NOT DETECTED NOT DETECTED   Parainfluenza Virus 3 NOT DETECTED NOT DETECTED   Parainfluenza Virus 4 NOT DETECTED NOT DETECTED   Respiratory Syncytial Virus  NOT DETECTED NOT DETECTED   Bordetella pertussis NOT DETECTED NOT DETECTED   Bordetella Parapertussis NOT DETECTED NOT DETECTED   Chlamydophila pneumoniae NOT DETECTED NOT DETECTED   Mycoplasma pneumoniae NOT DETECTED NOT DETECTED  Basic metabolic panel   Collection Time: 05/29/24  4:50 AM  Result Value Ref Range   Sodium 134 (L) 135 - 145 mmol/L   Potassium 3.8 3.5 - 5.1 mmol/L   Chloride 99 98 - 111 mmol/L   CO2 27 22 - 32 mmol/L   Glucose, Bld 124 (H) 70 - 99 mg/dL   BUN 13 6 - 20 mg/dL   Creatinine, Ser 9.33 0.44 - 1.00 mg/dL   Calcium  8.9 8.9 - 10.3 mg/dL   GFR, Estimated >39 >39 mL/min   Anion gap 8 5 - 15  Glucose, capillary   Collection Time: 05/29/24  5:44 AM  Result Value Ref Range   Glucose-Capillary 72 70 - 99 mg/dL  Glucose, capillary   Collection Time: 05/29/24  3:03 PM  Result Value Ref Range   Glucose-Capillary 87 70 - 99 mg/dL   US  MFM OB LIMITED Result Date: 05/29/2024 ----------------------------------------------------------------------  OBSTETRICS REPORT                       (Signed Final 05/29/2024 03:56 pm) ---------------------------------------------------------------------- Patient Info  ID #:       [id number]                          D.O.B.:  [birth year] (38 yrs)(F)  Name:       Brooke Sandoval                  Visit Date: 05/29/2024 10:17 am ---------------------------------------------------------------------- Performed By  Attending:        Delora Smaller DO       Ref. Address:     [address]                                                             [place], [place]                                                              [address]  Performed By:     Burnard LITTIE Custard          Secondary Phy.:   [facility]                    [facility]                                                             [facility]  Referred By:      GLENYS GORMAN BIRK          [facility] ---------------------------------------------------------------------- Orders  #  Description                           Code        Ordered By  1  US  MFM OB LIMITED                     23184.98    GLENYS BIRK ----------------------------------------------------------------------  #  Order #                     Accession #                Episode #  1  [id number]                   [id number]                 [id number] ---------------------------------------------------------------------- Indications  Decreased fetal movement                       O36.8190  [redacted] weeks gestation of pregnancy                Z3A.25  Unable to hear fetal heart tones as reason     O76  for ultrasound  Obesity complicating pregnancy, second         O99.212  trimester (BMI 57) ---------------------------------------------------------------------- Fetal Evaluation  Num Of Fetuses:         1  Fetal Heart Rate(bpm):  140  Cardiac Activity:       Observed  Presentation:           Cephalic  Placenta:               Anterior  Amniotic Fluid  AFI FV:      Within normal limits                              Largest Pocket(cm)                              5.3  Comment:    Good fetal movement seen. ---------------------------------------------------------------------- OB History  Gravidity:    9         Term:   0        Prem:   7        SAB:   1  TOP:          0       Ectopic:  0        Living: 5 ---------------------------------------------------------------------- Gestational Age  Best:          25w 5d     Det. By:  Early Ultrasound         EDD:   09/06/24                                      (01/22/24)  ---------------------------------------------------------------------- Anatomy  Stomach:  Appears normal, left   Bladder:                Appears normal                         sided ---------------------------------------------------------------------- Cervix Uterus Adnexa  Cervix  Not visualized (advanced GA >24wks)  Adnexa  No abnormality visualized ---------------------------------------------------------------------- Comments  Hospital Ultrasound  The patient presented to the MAU for decreased FM. US  was  ordered due to inability to find the fetal heart rate likely due to  maternal body habitus.  Sonographic findings  Single intrauterine pregnancy at 25w 5d  Fetal cardiac activity: Observed and appears normal.  Presentation: Cephalic.  Amniotic fluid volume: Within normal limits. MVP: 5.3 cm.  Placenta: Anterior.  Recommendations  - Continue clinical management per OB provider.  This was a limited ultrasound with a remote read. If an official  MFM consult is requested for any reason please call/place an  order in Epic. ----------------------------------------------------------------------                  Delora Smaller, DO Electronically Signed Final Report   05/29/2024 03:56 pm ----------------------------------------------------------------------   CT Angio Chest Pulmonary Embolism (PE) W or WO Contrast Result Date: 05/29/2024 EXAM: CTA of the Chest with contrast for PE 05/29/2024 03:29:00 PM TECHNIQUE: CTA of the chest was performed after the administration of 75 mL of iohexol  (OMNIPAQUE ) 350 MG/ML injection. Multiplanar reformatted images are provided for review. MIP images are provided for review. Automated exposure control, iterative reconstruction, and/or weight based adjustment of the mA/kV was utilized to reduce the radiation dose to as low as reasonably achievable. COMPARISON: None available. CLINICAL HISTORY: Pulmonary embolism (PE) suspected, high prob. FINDINGS: PULMONARY ARTERIES:  Limited exam due to soft tissue attenuation artifact secondary to body habitus. There is no definite evidence of large pulmonary embolus seen in the main pulmonary artery or the proximal portions of the left and right pulmonary arteries. There is limited evaluation of the more peripheral branches due to above artifact and therefore smaller and more peripheral pulmonary emboli cannot be excluded on the basis of this exam. Main pulmonary artery is normal in caliber. MEDIASTINUM: The heart and pericardium demonstrate no acute abnormality. There is no acute abnormality of the thoracic aorta. LYMPH NODES: No mediastinal, hilar or axillary lymphadenopathy. LUNGS AND PLEURA: The lungs are without acute process. No focal consolidation or pulmonary edema. No pleural effusion or pneumothorax. UPPER ABDOMEN: Limited images of the upper abdomen are unremarkable. SOFT TISSUES AND BONES: No acute bone or soft tissue abnormality. IMPRESSION: 1. No definite evidence of large pulmonary embolus in the main pulmonary artery or proximal portions of the left and right pulmonary arteries. 2. Evaluation of the peripheral pulmonary arterial branches is limited by soft tissue attenuation from body habitus, and smaller or more peripheral emboli cannot be excluded. Electronically signed by: Lynwood Seip MD 05/29/2024 03:51 PM EST RP Workstation: [id number]   VAS US  LOWER EXTREMITY VENOUS (DVT) Result Date: 05/28/2024  Lower Venous DVT Study Patient Name:  ALICE BURNSIDE  Date of Exam:   05/28/2024 Medical Rec #: [id number]       Accession #:    [id number] Date of Birth: 05/29/1986        Patient Gender: F Patient Age:   35 years Exam Location:  [facility] Procedure:      VAS US  LOWER EXTREMITY VENOUS (DVT) Referring Phys: SUZEN NEWTON --------------------------------------------------------------------------------  Indications: Pulmonary embolism. Other  Indications: [redacted] weeks pregnant. Anticoagulation: Lovenox . Comparison Study:  Previous study on the left lower extremity on 10.27.2023. Performing Technologist: Edilia Elden Appl  Examination Guidelines: A complete evaluation includes B-mode imaging, spectral Doppler, color Doppler, and power Doppler as needed of all accessible portions of each vessel. Bilateral testing is considered an integral part of a complete examination. Limited examinations for reoccurring indications may be performed as noted. The reflux portion of the exam is performed with the patient in reverse Trendelenburg.  +---------+---------------+---------+-----------+----------+----------------+ RIGHT    CompressibilityPhasicitySpontaneityPropertiesThrombus Aging   +---------+---------------+---------+-----------+----------+----------------+ CFV      Full           Yes      Yes                                   +---------+---------------+---------+-----------+----------+----------------+ SFJ      Full           Yes      Yes                                   +---------+---------------+---------+-----------+----------+----------------+ FV Prox  Full                                                          +---------+---------------+---------+-----------+----------+----------------+ FV Mid   Full                                                          +---------+---------------+---------+-----------+----------+----------------+ FV DistalFull                                                          +---------+---------------+---------+-----------+----------+----------------+ PFV      Full                                                          +---------+---------------+---------+-----------+----------+----------------+ POP      Full           Yes      Yes                                   +---------+---------------+---------+-----------+----------+----------------+ PTV                                                   Patent by color.  +---------+---------------+---------+-----------+----------+----------------+ PERO  Patent by color. +---------+---------------+---------+-----------+----------+----------------+   +---------+---------------+---------+-----------+----------+----------------+ LEFT     CompressibilityPhasicitySpontaneityPropertiesThrombus Aging   +---------+---------------+---------+-----------+----------+----------------+ CFV      Full           Yes      Yes                                   +---------+---------------+---------+-----------+----------+----------------+ SFJ      Full                                                          +---------+---------------+---------+-----------+----------+----------------+ FV Prox  Full                                                          +---------+---------------+---------+-----------+----------+----------------+ FV Mid   Full                                                          +---------+---------------+---------+-----------+----------+----------------+ FV DistalFull                                                          +---------+---------------+---------+-----------+----------+----------------+ PFV      Full                                                          +---------+---------------+---------+-----------+----------+----------------+ POP      Full           Yes      Yes                                   +---------+---------------+---------+-----------+----------+----------------+ PTV                                                   Patent by color. +---------+---------------+---------+-----------+----------+----------------+ PERO                                                  Patent by color. +---------+---------------+---------+-----------+----------+----------------+     Summary: RIGHT: - There is no evidence of deep vein thrombosis in the  lower extremity. However, portions of this examination were limited- see technologist comments above.  - No cystic structure found in the popliteal fossa.  LEFT: - There is no evidence of deep vein thrombosis in the lower extremity. However, portions of this examination were limited- see technologist comments above.  - No cystic structure found in the popliteal fossa.  *See table(s) above for measurements and observations. Electronically signed by Debby Robertson on 05/28/2024 at 4:06:56 PM.    Final    ECHOCARDIOGRAM COMPLETE Result Date: 05/28/2024    ECHOCARDIOGRAM REPORT   Patient Name:   Cataleya Brook Date of Exam: 05/28/2024 Medical Rec #:  [id number]      Height:       67.0 in Accession #:    [id number]     Weight:       392.4 lb Date of Birth:  February 02, 1986       BSA:          2.693 m Patient Age:    38 years       BP:           123/55 mmHg Patient Gender: F              HR:           96 bpm. Exam Location:  Inpatient Procedure: 2D Echo, Cardiac Doppler and Color Doppler (Both Spectral and Color            Flow Doppler were utilized during procedure). Indications:    Congestive Heart Failure I50.9  History:        Patient has no prior history of Echocardiogram examinations.                 CHF.  Sonographer:    Jayson Gaskins Referring Phys: [id number] [facility] NILES NEWTON  Sonographer Comments: No Definity used due to pregnancy. IMPRESSIONS  1. Left ventricular ejection fraction, by estimation, is 55 to 60%. The left ventricle has normal function. Left ventricular endocardial border not optimally defined to evaluate regional wall motion. Left ventricular diastolic parameters were normal.  2. Right ventricular systolic function is normal. The right ventricular size is normal.  3. The mitral valve is grossly normal. Trivial mitral valve regurgitation.  4. The aortic valve was not well visualized. Aortic valve regurgitation is not visualized. Comparison(s): Technically difficutl evaluation but overall normal  echocardiogram. FINDINGS  Left Ventricle: Left ventricular ejection fraction, by estimation, is 55 to 60%. The left ventricle has normal function. Left ventricular endocardial border not optimally defined to evaluate regional wall motion. The left ventricular internal cavity size was normal in size. There is borderline left ventricular hypertrophy. Left ventricular diastolic parameters were normal. Right Ventricle: The right ventricular size is normal. No increase in right ventricular wall thickness. Right ventricular systolic function is normal. Left Atrium: Left atrial size was normal in size. Right Atrium: Right atrial size was normal in size. Pericardium: There is no evidence of pericardial effusion. Presence of epicardial fat layer. Mitral Valve: The mitral valve is grossly normal. Trivial mitral valve regurgitation. Tricuspid Valve: The tricuspid valve is grossly normal. Tricuspid valve regurgitation is trivial. Aortic Valve: The aortic valve was not well visualized. Aortic valve regurgitation is not visualized. Aortic valve mean gradient measures 5.0 mmHg. Aortic valve peak gradient measures 8.3 mmHg. Aortic valve area, by VTI measures 3.37 cm. Pulmonic Valve: The pulmonic valve was not well visualized. Pulmonic valve regurgitation is not visualized. Aorta: The aortic root and ascending aorta are structurally normal, with no evidence of dilitation. IAS/Shunts: The interatrial septum was not well visualized.  LEFT VENTRICLE PLAX 2D LVIDd:  4.20 cm   Diastology LVIDs:         3.40 cm   LV e' medial:    7.40 cm/s LV PW:         1.20 cm   LV E/e' medial:  10.6 LV IVS:        0.90 cm   LV e' lateral:   11.20 cm/s LVOT diam:     2.00 cm   LV E/e' lateral: 7.0 LV SV:         89 LV SV Index:   33 LVOT Area:     3.14 cm  RIGHT VENTRICLE RV S prime:     13.40 cm/s TAPSE (M-mode): 2.4 cm LEFT ATRIUM             Index        RIGHT ATRIUM           Index LA Vol (A2C):   35.9 ml 13.33 ml/m  RA Area:     10.10 cm  LA Vol (A4C):   51.1 ml 18.97 ml/m  RA Volume:   19.80 ml  7.35 ml/m LA Biplane Vol: 42.7 ml 15.86 ml/m  AORTIC VALVE AV Area (Vmax):    2.90 cm AV Area (Vmean):   2.92 cm AV Area (VTI):     3.37 cm AV Vmax:           144.00 cm/s AV Vmean:          112.000 cm/s AV VTI:            0.264 m AV Peak Grad:      8.3 mmHg AV Mean Grad:      5.0 mmHg LVOT Vmax:         133.00 cm/s LVOT Vmean:        104.000 cm/s LVOT VTI:          0.283 m LVOT/AV VTI ratio: 1.07  AORTA Ao Root diam: 2.70 cm Ao Asc diam:  3.70 cm MITRAL VALVE MV Area (PHT): 3.21 cm    SHUNTS MV Decel Time: 236 msec    Systemic VTI:  0.28 m MV E velocity: 78.10 cm/s  Systemic Diam: 2.00 cm MV A velocity: 91.20 cm/s MV E/A ratio:  0.86 Franck Azobou Tonleu Electronically signed by Joelle Cedars Tonleu Signature Date/Time: 05/28/2024/9:32:56 AM    Final    DG Chest Portable 1 View Result Date: 05/27/2024 CLINICAL DATA:  Shortness of breath.  Cough. EXAM: PORTABLE CHEST 1 VIEW COMPARISON:  Radiograph 05/15/2024 FINDINGS: The cardiomediastinal contours are normal. The lungs are clear. Pulmonary vasculature is normal. No consolidation, pleural effusion, or pneumothorax. No acute osseous abnormalities are seen. IMPRESSION: No acute findings. Electronically Signed   By: Andrea Gasman M.D.   On: 05/27/2024 19:47   US  MFM OB FOLLOW UP Result Date: 05/27/2024 ----------------------------------------------------------------------  OBSTETRICS REPORT                       (Signed Final 05/27/2024 02:43 pm) ---------------------------------------------------------------------- Patient Info  ID #:       [id number]                          D.O.B.:  1985-10-12 (38 yrs)(F)  Name:       Brooke Sandoval                  Visit Date: 05/27/2024 02:04 pm ---------------------------------------------------------------------- Performed By  Attending:  Corenthian Booker      Ref. Address:     [address]                    [place]                                                              [place], [place]                                                             [address]  Performed By:     Meade Parker       [facility]  Referred By:      [facility] ---------------------------------------------------------------------- Orders  #  Description                           Code        Ordered By  1  US  MFM OB FOLLOW UP                   [id number]    BABARA KEYS ----------------------------------------------------------------------  #  Order #                     Accession #                Episode #  1  [id number]                   [id number]                 [id number] ---------------------------------------------------------------------- Indications  [redacted] weeks gestation of pregnancy                Z3A.25  Abdominal pain in pregnancy  O99.89  Obesity complicating pregnancy, second         O99.212  trimester (BMI 86)  Advanced maternal age multigravida 80+,        O53.522  second trimester (38 yrs)  Previous cesarean delivery, antepartum (x5)    O24.219  Grand multiparity, antepartum                  O09.40  Poor obstetric history: Previous preterm       O09.219  delivery, antepartum (No term deliveries)  Pre-existing essential hypertension            O10.012  complicating pregnancy, second trimester  Medical complication of pregnancy (Hepatic     O26.90  steatosis, ventral hernia)(Hx of DVT, SUD,  CHF [Lasix ], bowel injury)  Poor obstetric history: Previous               O09.299  preeclampsia, GDM, PE (on Lovenox ),  HELLP  Protein S deficiency complicating pregnancy    O99.199  Poor obstetric history: Previous IUFD          O22.299  (stillbirth) x2, 24w 2007, 24w 2011)  Abnormal chromosomal and genetic finding        O28.5  on antenatal screening of mother (Low fetal  fraction on Panorama, LOW RISK [facility]  NIPS)  Encounter for other antenatal screening        Z36.2  follow-up  Obesity complicating pregnancy, second         O99.212  trimester (BMI 57)  Encounter for other antenatal screening        Z36.2  follow-up ---------------------------------------------------------------------- Fetal Evaluation  Num Of Fetuses:         1  Cardiac Activity:       Observed  Presentation:           Transverse, head to maternal left  Placenta:               Anterior  P. Cord Insertion:      Previously seen  Amniotic Fluid  AFI FV:      Within normal limits                              Largest Pocket(cm)                              5.28 ---------------------------------------------------------------------- Biometry  BPD:      66.2  mm     G. Age:  26w 5d         82  %    CI:        80.28   %    70 - 86                                                          FL/HC:      20.3   %    18.7 - 20.3  HC:      233.4  mm     G. Age:  25w 3d         25  %    HC/AC:      1.09        1.04 - 1.22  AC:  214.3  mm     G. Age:  26w 0d         57  %    FL/BPD:     71.6   %    71 - 87  FL:       47.4  mm     G. Age:  25w 6d         49  %    FL/AC:      22.1   %    20 - 24  CER:      26.6  mm     G. Age:  23w 6d         14  %  LV:        4.8  mm  CM:        4.9  mm  Est. FW:     865  gm    1 lb 15 oz      60  % ---------------------------------------------------------------------- OB History  Gravidity:    9         Term:   0        Prem:   7        SAB:   1  TOP:          0       Ectopic:  0        Living: 5 ---------------------------------------------------------------------- Gestational Age  U/S Today:     26w 0d                                        EDD:   09/02/24  Best:          25w 3d     Det. By:  Early Ultrasound         EDD:   09/06/24                                      (01/22/24)  ---------------------------------------------------------------------- Targeted Anatomy  Central Nervous System  Calvarium/Cranial V.:  Appears normal         Cereb./Vermis:          Not well visualized  Cavum:                 Appears normal         Cisterna Magna:         Not well visualized  Lateral Ventricles:    Appears normal         Midline Falx:           Appears normal  Choroid Plexus:        Not well visualized  Spine  Cervical:              Not well visualized    Sacral:                 Not well visualized  Thoracic:              Not well visualized    Shape/Curvature:        Not well visualized  Lumbar:                Not well visualized  Head/Neck  Lips:  Not well visualized    Profile:                Appears normal  Neck:                  Not well visualized    Orbits/Eyes:            Lenses nwv  Nuchal Fold:           Not applicable         Mandible:               Not well visualized  Nasal Bone:            Present                Maxilla:                Not well visualized  Thorax  4 Chamber View:        Appears normal         Interventr. Septum:     Appears normal  Cardiac Rhythm:        Normal                 Cardiac Axis:           Appears normal  Cardiac Situs:         Appears normal         Diaphragm:              Appears normal  Rt Outflow Tract:      Not well visualized    3 Vessel View:          Appears normal  Lt Outflow Tract:      Appears normal         3 V Trachea View:       Not well visualized  Aortic Arch:           Not well visualized    IVC:                    Appears normal  Ductal Arch:           Previously seen        Crossing:               Appears normal  SVC:                   Appears normal  Abdomen  Ventral Wall:          Appears normal         Lt Kidney:              Not well visualized  Cord Insertion:        Appears normal         Rt Kidney:              Not well visualized  Situs:                 Appears normal         Bladder:                Previously seen   Stomach:               Appears normal  Extremities  Lt Humerus:            Previously seen        Lt Femur:  Previously seen  Rt Humerus:            Previously seen        Rt Femur:               Previously seen  Lt Forearm:            Previously seen        Lt Lower Leg:           Previously seen  Rt Forearm:            Previously seen        Rt Lower Leg:           Previously seen  Lt Hand:               Previously seen        Lt Foot:                limited lateral view  Rt Hand:               Not well visualized    Rt Foot:                Not well visualized  Other  Umbilical Cord:        Previously seen        Genitalia:              Female prev seen  Comment:     Technically difficult due to hernia in maternal RLQ, BMI 57, and               fetal position. ---------------------------------------------------------------------- Cervix Uterus Adnexa  Cervix  Not visualized (advanced GA >24wks)  Uterus  No abnormality visualized.  Right Ovary  Not visualized.  Left Ovary  Not visualized.  Cul De Sac  No free fluid seen.  Adnexa  No abnormality visualized ---------------------------------------------------------------------- Impression  MFM Consultation  Ms. Brockel is a 38 yo G9P5 at 1 w 3 d with an EDD of  09/06/24.  She is here for follow up growth exam.  She has multiple issue complicating her pregnancy  Most notably  CHTN  BMI 50  CHF managed with Lasix   H/o DVT on Lovenox .  She denies s/sx of PTL or Preeclampsia however, she  complains of worsening SOB and coughing at night when she  lays down. She says she cannot sleep at night either.  She has a f/u maternal echo today and scheduled to see  cardiology on Friday.           05/27/2024    1:17 PM       05/15/2024    9:30 PM  PM       05/15/2024    5:49 PM  Vitals with BMI  Systolic 136      124      145  Diastolic92       69       82  Pulse             89       95  Imaging:  SIUP with measurements consistent with dates  Good fetal movement and  amniotic fluid again observed  Suboptimal views of the fetal anatomy was again seen  secondary to fetal position and maternal habitus.  Impression/Counseling:  1) CHF- I discussed with Ms Begeman her symptoms and  recommended she go to MAU for further evaluation after her  echo (they will likely want to get one  anyway).  May need additional Lasix . Will see after echocardiogram.  Consider consulting cards if admitted.  BNP on 11/20 was 20.  2) CHTN-BP stable on Labetalol   3) h/o PE on Lovenox .  -Needs AntiXa assessment again.  Repeat growth scheduled in 3-4 weeks.  I spent 30 minutes with > 50% in face to face consultation  Nathanel DOROTHA Fetters, MD ----------------------------------------------------------------------              Nathanel Fetters, MD Electronically Signed Final Report   05/27/2024 02:43 pm ----------------------------------------------------------------------   US  MFM OB LIMITED Result Date: 05/16/2024 ----------------------------------------------------------------------  OBSTETRICS REPORT                        (Signed Final 05/16/2024 06:19 am) ---------------------------------------------------------------------- Patient Info  ID #:       [id number]                          D.O.B.:  Oct 14, 1985 (38 yrs)(F)  Name:       Brooke Sandoval                  Visit Date: 05/15/2024 07:37 pm ---------------------------------------------------------------------- Performed By  Attending:        Steffan Keys MD         Ref. Address:      [address]                                                              [place], [place]                                                              [address]  Performed By:     Jonette Nap        [facility]  Referred By:      [facility] ---------------------------------------------------------------------- Orders  #  Description                            Code        Ordered By  1  US  MFM OB LIMITED                     23184.98    GLENYS BIRK ----------------------------------------------------------------------  #  Order #                     Accession #  Episode #  1  [id number]                   [id number]                 [id number] ---------------------------------------------------------------------- Indications  Unable to hear fetal heart tones as reason      O76  for ultrasound  Abdominal pain in pregnancy                     O99.89  Obesity complicating pregnancy, second          O99.212  trimester (BMI 63)  Advanced maternal age multigravida 54+,         O55.522  second trimester (38 yrs)  Previous cesarean delivery, antepartum (x5)     O23.219  Grand multiparity, antepartum                   O09.40  Poor obstetric history: Previous preterm        O09.219  delivery, antepartum (No term deliveries)  Pre-existing essential hypertension             O10.012  complicating pregnancy, second trimester  Medical complication of pregnancy (Hepatic      O26.90  steatosis, ventral hernia)(Hx of DVT, SUD,  CHF [Lasix ], bowel injury)  Poor obstetric history: Previous                O09.299  preeclampsia, GDM, PE (on Lovenox ),  HELLP  Protein S deficiency complicating pregnancy     O99.199  Poor obstetric history: Previous IUFD           O6.299  (stillbirth) x2, 24w 2007, 24w 2011)  [redacted] weeks gestation of pregnancy                 Z3A.23  Abnormal chromosomal and genetic finding        O28.5  on antenatal screening of mother (Low fetal  fraction on Panorama, LOW RISK [facility]  NIPS)  LR [facility] NIPS - Female  Encounter for other antenatal screening         Z36.2  follow-up ---------------------------------------------------------------------- Fetal Evaluation  Num Of Fetuses:          1  Fetal Heart Rate(bpm):   144  Cardiac Activity:        Observed  Presentation:            Transverse, head to maternal right  Placenta:                Anterior  Amniotic  Fluid  AFI FV:      Within normal limits                              Largest Pocket(cm)                              3.7 ---------------------------------------------------------------------- OB History  Gravidity:    9         Term:   0        Prem:   7        SAB:   1  TOP:          0       Ectopic:  0        Living: 5 ---------------------------------------------------------------------- Gestational Age  Best:  23w 5d     Det. By:  Early Ultrasound         EDD:   09/06/24                                      (01/22/24) ---------------------------------------------------------------------- Anatomy  Heart:                 Visualized             Bladder:                Appears normal  Stomach:               Appears normal, left                         sided ---------------------------------------------------------------------- Comments  This patient presented to the MAU due to abdominal pain and  shortness of breath. The fetal heart tones could not be found  in the MAU due to maternal body habitus.  A limited ultrasound performed today shows that the fetus is  in the transverse presentation.  There was normal amniotic fluid noted.  The fetal heart rate was 144 beats a minute. ----------------------------------------------------------------------                   Steffan Keys, MD Electronically Signed Final Report   05/16/2024 06:19 am ----------------------------------------------------------------------   DG Abd Portable 1 View Result Date: 05/15/2024 CLINICAL DATA:  Abdominal pain. EXAM: PORTABLE ABDOMEN - 1 VIEW COMPARISON:  None Available. FINDINGS: The bowel gas pattern is normal. No radio-opaque calculi or other significant radiographic abnormality are seen. IMPRESSION: Negative. Electronically Signed   By: Vanetta Chou M.D.   On: 05/15/2024 19:09   DG Chest Port 1V same Day Result Date: 05/15/2024 CLINICAL DATA:  Shortness of breath EXAM: PORTABLE CHEST 1 VIEW COMPARISON:  Chest x-ray  12/17/2023 FINDINGS: The heart size and mediastinal contours are within normal limits. Both lungs are clear. The visualized skeletal structures are unremarkable. IMPRESSION: No active disease. Electronically Signed   By: Greig Pique M.D.   On: 05/15/2024 19:07    Future Appointments  Date Time Provider Department Center  05/30/2024  1:20 PM Sheena Pugh, DO [facility] None  06/13/2024  8:15 AM Lola Donnice HERO, MD [facility]  06/13/2024  8:40 AM [facility]  06/24/2024 10:15 AM Zina Jerilynn LABOR, MD [facility]  07/09/2024 11:15 AM Fredirick Glenys RAMAN, MD [facility]  07/10/2024 10:20 AM Jaycee Greig PARAS, NP [facility] [address]  07/24/2024 11:15 AM Cleatus Moccasin, MD [facility]  08/06/2024 11:15 AM [facility] 1 [facility]  08/13/2024 11:15 AM [facility] 1 [facility]  08/20/2024 11:15 AM [facility] 1 [facility]  08/27/2024 11:15 AM [facility] 1 [facility]  09/03/2024 11:15 AM [facility] 1 [facility]    Discharge Condition: Stable  Discharge disposition: 01-Home or Self Care       Discharge Instructions     Discharge activity:  No Restrictions   Complete by: As directed    Discharge diet:  No restrictions   Complete by: As directed    Discharge instructions   Complete by: As directed    Call or come to MAU for any severe breathing issues   No sexual activity restrictions   Complete by: As directed    Notify physician for a general feeling that something is  not right   Complete by: As directed    Notify physician for increase or change in vaginal discharge   Complete by: As directed    Notify physician for intestinal cramps, with or without diarrhea, sometimes described as gas pain   Complete by: As directed    Notify physician for leaking of fluid   Complete by: As directed    Notify physician for low, dull backache, unrelieved by heat or Tylenol    Complete by: As directed    Notify physician for menstrual like cramps   Complete by: As directed    Notify  physician for pelvic pressure   Complete by: As directed    Notify physician for uterine contractions.  These may be painless and feel like the uterus is tightening or the baby is  balling up   Complete by: As directed    Notify physician for vaginal bleeding   Complete by: As directed    PRETERM LABOR:  Includes any of the follwing symptoms that occur between 20 - [redacted] weeks gestation.  If these symptoms are not stopped, preterm labor can result in preterm delivery, placing your baby at risk   Complete by: As directed       Allergies as of 05/29/2024       Reactions   Prednisone  Swelling        Medication List     STOP taking these medications    promethazine  6.25 MG/5ML solution Commonly known as: PHENERGAN        TAKE these medications    albuterol  108 (90 Base) MCG/ACT inhaler Commonly known as: VENTOLIN  HFA Inhale 1-2 puffs into the lungs every 6 (six) hours as needed for wheezing or shortness of breath.   aspirin  81 MG chewable tablet Commonly known as: Aspirin  Childrens Chew 2 tablets (162 mg total) by mouth daily.   Blood Pressure Monitoring Devi 1 each by Does not apply route once a week.   docusate sodium  100 MG capsule Commonly known as: Colace Take 1 capsule (100 mg total) by mouth 2 (two) times daily.   enoxaparin  100 MG/ML injection Commonly known as: LOVENOX  Inject 1 mL (100 mg total) into the skin every 12 (twelve) hours.   furosemide  40 MG tablet Commonly known as: Lasix  Take 1 tablet (40 mg total) by mouth daily.   hydrOXYzine  25 MG tablet Commonly known as: ATARAX  Take 1 tablet (25 mg total) by mouth 3 (three) times daily as needed for anxiety.   ipratropium-albuterol  0.5-2.5 (3) MG/3ML Soln Commonly known as: DUONEB Take 3 mLs by nebulization every 6 (six) hours as needed.   Iron  (Ferrous Sulfate ) 325 (65 Fe) MG Tabs Take 1 tablet by mouth every other day.   labetalol  200 MG tablet Commonly known as: NORMODYNE  Take 1 tablet (200 mg  total) by mouth 2 (two) times daily.   multivitamin-prenatal 27-0.8 MG Tabs tablet Take 1 tablet by mouth daily at 12 noon.   ondansetron  4 MG disintegrating tablet Commonly known as: ZOFRAN -ODT Take 1 tablet (4 mg total) by mouth every 8 (eight) hours as needed for nausea vomitting What changed: Another medication with the same name was removed. Continue taking this medication, and follow the directions you see here.   oxyCODONE  5 MG immediate release tablet Commonly known as: Oxy IR/ROXICODONE  Take 1 tablet (5 mg total) by mouth every 4 (four) hours as needed for severe pain (pain score 7-10) or breakthrough pain. What changed: reasons to take this   Symbicort  160-4.5 MCG/ACT inhaler  Generic drug: budesonide -formoterol  Inhale 2 puffs into the lungs 2 (two) times daily.        Follow-up Information     [facility] Follow up on 07/10/2024.   Why: PCP - appointment on Jan 15th 2026 with Greig Chute NP at 10:20- please arrive 15 min early and bring photo ID and insurance card. Contact information: [address] [place]  [address]        Tobb, Kardie, DO. Go on 05/30/2024.   Specialty: Cardiology Why: scheduled appt Contact information: [address] [place] [address] [phone]         [facility] [employer] [facility]. Go on 06/13/2024.   Specialty: Obstetrics and Gynecology Why: as scheduled Contact information: [address] [place]  [address] [phone]                Total discharge time: 30 minutes   Signed: Jerilynn DELENA Buddle M.D. 05/29/2024, 5:05 PM

## 2024-05-29 NOTE — Assessment & Plan Note (Signed)
 Low risk female on NIPT

## 2024-05-29 NOTE — Assessment & Plan Note (Signed)
 Will start Vistaril 25 mg 3 times daily as needed for anxiety

## 2024-05-29 NOTE — Assessment & Plan Note (Signed)
 Patient previously on intermittent twice daily dosing and will resume this if there is no evidence of acute PE.

## 2024-05-30 ENCOUNTER — Ambulatory Visit: Admitting: Cardiology

## 2024-06-09 ENCOUNTER — Other Ambulatory Visit: Payer: Self-pay

## 2024-06-09 DIAGNOSIS — O0990 Supervision of high risk pregnancy, unspecified, unspecified trimester: Secondary | ICD-10-CM

## 2024-06-12 ENCOUNTER — Telehealth: Payer: Self-pay

## 2024-06-12 NOTE — Progress Notes (Signed)
 Complex Care Management Note  Care Guide Note 06/12/2024 Name: [name] MRN: [id number] DOB: 02-02-1986  Brooke Sandoval is a 38 y.o. year old female who sees Patient, No Pcp Per for primary care. I reached out to Harlene Lesches by phone today to offer complex care management services.  [name] was given information about Complex Care Management services today including:   The Complex Care Management services include support from the care team which includes your Nurse Care Manager, Clinical Social Worker, or Pharmacist.  The Complex Care Management team is here to help remove barriers to the health concerns and goals most important to you. Complex Care Management services are voluntary, and the patient may decline or stop services at any time by request to their care team member.   Complex Care Management Consent Status: Patient agreed to services and verbal consent obtained.   Follow up plan:  Telephone appointment with complex care management team member scheduled for:  07/01/24 @ 2 PM  Encounter Outcome:  Patient Scheduled  Leotis Rase [facility] Guide  Direct Dial: [phone]  Fax [phone]

## 2024-06-13 ENCOUNTER — Other Ambulatory Visit: Payer: Self-pay

## 2024-06-13 ENCOUNTER — Encounter: Payer: Self-pay | Admitting: Family Medicine

## 2024-06-13 ENCOUNTER — Other Ambulatory Visit (HOSPITAL_COMMUNITY): Payer: Self-pay

## 2024-06-13 ENCOUNTER — Ambulatory Visit (INDEPENDENT_AMBULATORY_CARE_PROVIDER_SITE_OTHER): Admitting: Family Medicine

## 2024-06-13 ENCOUNTER — Ambulatory Visit: Attending: Obstetrics and Gynecology | Admitting: *Deleted

## 2024-06-13 ENCOUNTER — Other Ambulatory Visit

## 2024-06-13 ENCOUNTER — Other Ambulatory Visit: Payer: Self-pay | Admitting: Family Medicine

## 2024-06-13 VITALS — BP 122/80 | HR 103 | Wt >= 6400 oz

## 2024-06-13 DIAGNOSIS — O99112 Other diseases of the blood and blood-forming organs and certain disorders involving the immune mechanism complicating pregnancy, second trimester: Secondary | ICD-10-CM

## 2024-06-13 DIAGNOSIS — K436 Other and unspecified ventral hernia with obstruction, without gangrene: Secondary | ICD-10-CM

## 2024-06-13 DIAGNOSIS — O4440 Low lying placenta NOS or without hemorrhage, unspecified trimester: Secondary | ICD-10-CM

## 2024-06-13 DIAGNOSIS — Z8759 Personal history of other complications of pregnancy, childbirth and the puerperium: Secondary | ICD-10-CM

## 2024-06-13 DIAGNOSIS — O99212 Obesity complicating pregnancy, second trimester: Secondary | ICD-10-CM | POA: Insufficient documentation

## 2024-06-13 DIAGNOSIS — F419 Anxiety disorder, unspecified: Secondary | ICD-10-CM

## 2024-06-13 DIAGNOSIS — O26612 Liver and biliary tract disorders in pregnancy, second trimester: Secondary | ICD-10-CM

## 2024-06-13 DIAGNOSIS — Z23 Encounter for immunization: Secondary | ICD-10-CM

## 2024-06-13 DIAGNOSIS — O10912 Unspecified pre-existing hypertension complicating pregnancy, second trimester: Secondary | ICD-10-CM | POA: Insufficient documentation

## 2024-06-13 DIAGNOSIS — Z3A27 27 weeks gestation of pregnancy: Secondary | ICD-10-CM | POA: Insufficient documentation

## 2024-06-13 DIAGNOSIS — K760 Fatty (change of) liver, not elsewhere classified: Secondary | ICD-10-CM

## 2024-06-13 DIAGNOSIS — J45909 Unspecified asthma, uncomplicated: Secondary | ICD-10-CM

## 2024-06-13 DIAGNOSIS — Z3009 Encounter for other general counseling and advice on contraception: Secondary | ICD-10-CM | POA: Insufficient documentation

## 2024-06-13 DIAGNOSIS — O09291 Supervision of pregnancy with other poor reproductive or obstetric history, first trimester: Secondary | ICD-10-CM

## 2024-06-13 DIAGNOSIS — O4442 Low lying placenta NOS or without hemorrhage, second trimester: Secondary | ICD-10-CM | POA: Diagnosis not present

## 2024-06-13 DIAGNOSIS — O99512 Diseases of the respiratory system complicating pregnancy, second trimester: Secondary | ICD-10-CM

## 2024-06-13 DIAGNOSIS — J45901 Unspecified asthma with (acute) exacerbation: Secondary | ICD-10-CM

## 2024-06-13 DIAGNOSIS — I509 Heart failure, unspecified: Secondary | ICD-10-CM

## 2024-06-13 DIAGNOSIS — O09523 Supervision of elderly multigravida, third trimester: Secondary | ICD-10-CM

## 2024-06-13 DIAGNOSIS — D6859 Other primary thrombophilia: Secondary | ICD-10-CM | POA: Diagnosis not present

## 2024-06-13 DIAGNOSIS — Z98891 History of uterine scar from previous surgery: Secondary | ICD-10-CM

## 2024-06-13 DIAGNOSIS — O34219 Maternal care for unspecified type scar from previous cesarean delivery: Secondary | ICD-10-CM | POA: Diagnosis not present

## 2024-06-13 DIAGNOSIS — Z7189 Other specified counseling: Secondary | ICD-10-CM

## 2024-06-13 DIAGNOSIS — O0990 Supervision of high risk pregnancy, unspecified, unspecified trimester: Secondary | ICD-10-CM

## 2024-06-13 DIAGNOSIS — O10919 Unspecified pre-existing hypertension complicating pregnancy, unspecified trimester: Secondary | ICD-10-CM

## 2024-06-13 DIAGNOSIS — Z8679 Personal history of other diseases of the circulatory system: Secondary | ICD-10-CM

## 2024-06-13 DIAGNOSIS — N736 Female pelvic peritoneal adhesions (postinfective): Secondary | ICD-10-CM

## 2024-06-13 MED ORDER — ALBUTEROL SULFATE HFA 108 (90 BASE) MCG/ACT IN AERS
1.0000 | INHALATION_SPRAY | Freq: Four times a day (QID) | RESPIRATORY_TRACT | 1 refills | Status: AC | PRN
  Filled 2024-06-13: qty 6.7, 25d supply, fill #0

## 2024-06-13 MED ORDER — HYDROXYZINE HCL 25 MG PO TABS
25.0000 mg | ORAL_TABLET | Freq: Three times a day (TID) | ORAL | 0 refills | Status: AC | PRN
  Filled 2024-06-13 – 2024-06-14 (×3): qty 30, 10d supply, fill #0

## 2024-06-13 MED ORDER — ASPIRIN 81 MG PO CHEW
162.0000 mg | CHEWABLE_TABLET | Freq: Every day | ORAL | 11 refills | Status: AC
  Filled 2024-06-13 – 2024-06-14 (×3): qty 60, 30d supply, fill #0

## 2024-06-13 MED ORDER — IPRATROPIUM-ALBUTEROL 0.5-2.5 (3) MG/3ML IN SOLN
3.0000 mL | Freq: Four times a day (QID) | RESPIRATORY_TRACT | 0 refills | Status: DC | PRN
  Filled 2024-06-13: qty 360, 30d supply, fill #0
  Filled 2024-06-14 (×2): qty 90, 8d supply, fill #0
  Filled 2024-06-14: qty 270, 22d supply, fill #0

## 2024-06-13 MED ORDER — BUDESONIDE-FORMOTEROL FUMARATE 160-4.5 MCG/ACT IN AERO
2.0000 | INHALATION_SPRAY | Freq: Two times a day (BID) | RESPIRATORY_TRACT | 12 refills | Status: DC
  Filled 2024-06-13 – 2024-06-14 (×3): qty 10.2, 30d supply, fill #0

## 2024-06-13 MED ORDER — FUROSEMIDE 40 MG PO TABS
40.0000 mg | ORAL_TABLET | Freq: Two times a day (BID) | ORAL | 5 refills | Status: DC
  Filled 2024-06-13 – 2024-06-14 (×3): qty 60, 30d supply, fill #0

## 2024-06-13 MED ORDER — ONDANSETRON 4 MG PO TBDP
4.0000 mg | ORAL_TABLET | Freq: Three times a day (TID) | ORAL | 0 refills | Status: AC | PRN
  Filled 2024-06-13 – 2024-06-14 (×3): qty 90, 30d supply, fill #0

## 2024-06-13 MED ORDER — LABETALOL HCL 200 MG PO TABS
200.0000 mg | ORAL_TABLET | Freq: Two times a day (BID) | ORAL | 0 refills | Status: DC
  Filled 2024-06-13 – 2024-06-14 (×3): qty 60, 30d supply, fill #0

## 2024-06-13 MED ORDER — ENOXAPARIN SODIUM 100 MG/ML IJ SOSY
100.0000 mg | PREFILLED_SYRINGE | Freq: Two times a day (BID) | INTRAMUSCULAR | 11 refills | Status: AC
  Filled 2024-06-13: qty 60, 30d supply, fill #0

## 2024-06-13 MED ORDER — IRON (FERROUS SULFATE) 325 (65 FE) MG PO TABS
1.0000 | ORAL_TABLET | ORAL | 3 refills | Status: AC
  Filled 2024-06-13: qty 30, 60d supply, fill #0
  Filled 2024-06-14: qty 30, 30d supply, fill #0
  Filled 2024-06-14: qty 30, 60d supply, fill #0

## 2024-06-13 NOTE — Progress Notes (Signed)
 "  Subjective:  Alencia Gordon is a 38 y.o. [phone] at [redacted]w[redacted]d being seen today for ongoing prenatal care.  She is currently monitored for the following issues for this high-risk pregnancy and has Anxiety; Asthma affecting pregnancy, antepartum; BMI 50.0-59.9, adult (HCC); Chronic hypertension during pregnancy, antepartum; Previous cesarean delivery affecting pregnancy, antepartum; history of 5 c sections; Hx of pulmonary embolus during pregnancy; History of substance abuse (HCC); History of pre-eclampsia in prior pregnancy, currently pregnant in first trimester; Protein S deficiency affecting pregnancy, antepartum; History of gestational diabetes; Ventral hernia without obstruction or gangrene; History of congestive heart failure; Supervision of high risk pregnancy, antepartum; RED chart patient; At risk for injury related to surgical procedure, history of bowel injury; Placenta positioned low in anterior wall of uterus; Low fetal fraction on NIPS; Advanced maternal age in multigravida; Hepatic steatosis during pregnancy; Acute congestive heart failure (HCC); Irreducible Spigelian hernia; and Pelvic adhesions on their problem list.  Patient reports she fell on her knee just prior to arrival.  Contractions: Not present. Vag. Bleeding: None.  Movement: Present. Denies leaking of fluid.   The following portions of the patient's history were reviewed and updated as appropriate: allergies, current medications, past family history, past medical history, past social history, past surgical history and problem list. Problem list updated.  Objective:   Vitals:   06/13/24 0829  BP: 122/80  Pulse: (!) 103  Weight: (!) 404 lb (183.3 kg)    Fetal Status:     Movement: Present     General:  Alert, oriented and cooperative. Patient is in no acute distress.  Skin: Skin is warm and dry. No rash noted.   Cardiovascular: Normal heart rate noted  Respiratory: Normal respiratory effort, no problems with respiration  noted  Abdomen: Soft, gravid, appropriate for gestational age. Pain/Pressure: Absent     Pelvic: Vag. Bleeding: None     Cervical exam deferred        Extremities: Normal range of motion.  Edema: Deep pitting, indentation remains for a short time  Mental Status: Normal mood and affect. Normal behavior. Normal judgment and thought content.   Urinalysis:      Assessment and Plan:  Pregnancy: [id number] at [redacted]w[redacted]d  1. Supervision of high risk pregnancy, antepartum (Primary) BP normal FHR normal visually on bedside US , extremely difficult exam at significant depth so unable to measure accurately with M mode but appears visually normal with no decels on about a minute of observation Third trimester labs today TDAP given, has already received flu shot  Reports she has had a headache for the past two days, took some excedrin prior to coming but still has it, very worried about pre-eclampsia She reports headache is getting better, recommended she give tylenol  time to work and then if not improved after NST with MFM (see below) then she should go to MFM for evaluation  2. History of congestive heart failure Recent admission with concern for CHF, however BNP low at 25 and TTE without any signs of CHF Continue labetalol  200 BID, lasix  40 BID Following w Cardio OB  3. Multigravida of advanced maternal age in third trimester On ASA F/w MFM but does not have any visits scheduled and I am unsure what antenatal testing schedule they would like to pursue Discussed with MFM, they are able to get her an NST today, sent up after our visit and has growth scan scheduled now for 06/28/2023  4. Anxiety Severe, hydroxyzine  TID PRN  5. Asthma affecting pregnancy, antepartum Recently  admitted 05/27/24 - 05/29/24 for possible CHF exacerbation, ultimately thought to have asthma exacerbation due to Adenovirus Today lungs are still very wheezy though she is able to speak in full sentences and is not in respiratory  distress Discharged on symbicort  2 puffs BID, albuterol  and ipratropium PRN, reports she is taking symbicort  once daily, stressed she needs to take it twice daily  6. Chronic hypertension during pregnancy, antepartum Normotensive today Continue labetalol  200 BID, lasix  40 BID, ASA 172mg  daily  7. Hepatic steatosis during pregnancy US  from 03/28/2024 with hepatic steatosis, most recent CMP was normal and CT A/P from 05/29/2024 reports liver is unremarkable  8. history of 5 c sections History of bowel injury Last op note from 04/06/2015 available in Care Everywhere   Presentation- breech frank   2. Extensive scarring and obliteration of tissue planes in the subcutaneous tissue 3. Multiple thin, transparent web adhesions throughout small bowel. Adhesion to posterior peritoneum and fascia. 3. Small bowel deserosalization with oversew     4. Attenuated lower uterine segment 5.  Uterus, tubes, ovaries: Normal   Given this and additionally the massive hernia she has General Surgery at [facility] has recommended referral to tertiary care center I have called MFM referral coordinator at [facility] several times and left a voicemail once with no response I also attempted to call the [facility] MFM office and they require forms to be faxed, they will fax our office the form I placed an ambulatory referral and sent message to office staff to help facilitate this  9. History of pre-eclampsia in prior pregnancy, currently pregnant in first trimester Normotensive on ASA All deliveries preterm ranging from 22-36 weeks  10. Hx of pulmonary embolus during pregnancy Currently on something between intermediate and therapeutic dosing lovenox  100 mg BID Recent admission and had CTPA and LE DVT studies, both negative for clot Per ACOG guidelines could be on prophy, intermediate, or treatment dose  Called inpatient pharmacist to discuss, they had reviewed in depth during recent admission and Xa level was  obtained and was in appropriate range for prophy so decision made to leave at 100 BID given unusual clinical situation and appropriate level  11. Irreducible Spigelian hernia Gen Surg consulted during recent admission and [facility]  12. Pelvic adhesions See above  13. Placenta positioned low in anterior wall of uterus No evidence of PAS on anatomy scan F/w MFM  14. Protein S deficiency affecting pregnancy, antepartum See above  15. RED chart patient   16. Unwanted fertility Signed BTL consent 05/02/2024  Preterm labor symptoms and general obstetric precautions including but not limited to vaginal bleeding, contractions, leaking of fluid and fetal movement were reviewed in detail with the patient. Please refer to After Visit Summary for other counseling recommendations.  Return in about 2 weeks (around 06/27/2024) for [facility], ob visit, needs MD.   Lola Donnice HERO, MD "

## 2024-06-13 NOTE — Patient Instructions (Signed)

## 2024-06-13 NOTE — Procedures (Signed)
 Brooke Sandoval Oct 04, 1985 [redacted]w[redacted]d  Fetus A Non-Stress Test Interpretation for 06/13/2024-  Indication: Chronic Hypertension, Advanced Maternal Age >40 years, and preg obesity, CHF  Fetal Heart Rate A Mode: External Baseline Rate (A): 140 bpm Variability: Moderate Accelerations: 15 x 15, 10 x 10 Decelerations: None Multiple birth?: No  Uterine Activity Mode: Toco Contraction Frequency (min): none Pt could lnot tolerate toco on her abdomen. she coughed incessantly Contraction Duration (sec): no palpated nor felt u/c's pt RN or pt. Resting Tone Palpated: Relaxed  Interpretation (Fetal Testing) Nonstress Test Interpretation: Reactive Comments: Tracing reviewed by Dr. Ileana

## 2024-06-14 ENCOUNTER — Other Ambulatory Visit (HOSPITAL_COMMUNITY): Payer: Self-pay

## 2024-06-14 ENCOUNTER — Other Ambulatory Visit (HOSPITAL_BASED_OUTPATIENT_CLINIC_OR_DEPARTMENT_OTHER): Payer: Self-pay

## 2024-06-14 ENCOUNTER — Inpatient Hospital Stay (HOSPITAL_COMMUNITY)

## 2024-06-14 ENCOUNTER — Encounter: Payer: Self-pay | Admitting: Obstetrics and Gynecology

## 2024-06-14 ENCOUNTER — Encounter: Payer: Self-pay | Admitting: Family Medicine

## 2024-06-14 ENCOUNTER — Encounter (HOSPITAL_COMMUNITY): Payer: Self-pay | Admitting: Obstetrics and Gynecology

## 2024-06-14 ENCOUNTER — Other Ambulatory Visit: Payer: Self-pay | Admitting: Family Medicine

## 2024-06-14 ENCOUNTER — Inpatient Hospital Stay (HOSPITAL_COMMUNITY)
Admission: AD | Admit: 2024-06-14 | Discharge: 2024-06-18 | DRG: 831 | Disposition: A | Discharge status: Home / Self Care | Attending: Obstetrics and Gynecology | Admitting: Obstetrics and Gynecology

## 2024-06-14 ENCOUNTER — Other Ambulatory Visit: Payer: Self-pay

## 2024-06-14 DIAGNOSIS — Z79899 Other long term (current) drug therapy: Secondary | ICD-10-CM

## 2024-06-14 DIAGNOSIS — O10913 Unspecified pre-existing hypertension complicating pregnancy, third trimester: Secondary | ICD-10-CM | POA: Diagnosis present

## 2024-06-14 DIAGNOSIS — O368130 Decreased fetal movements, third trimester, not applicable or unspecified: Secondary | ICD-10-CM | POA: Diagnosis present

## 2024-06-14 DIAGNOSIS — N736 Female pelvic peritoneal adhesions (postinfective): Secondary | ICD-10-CM | POA: Diagnosis present

## 2024-06-14 DIAGNOSIS — Z86711 Personal history of pulmonary embolism: Secondary | ICD-10-CM

## 2024-06-14 DIAGNOSIS — I509 Heart failure, unspecified: Secondary | ICD-10-CM

## 2024-06-14 DIAGNOSIS — W19XXXA Unspecified fall, initial encounter: Secondary | ICD-10-CM | POA: Diagnosis present

## 2024-06-14 DIAGNOSIS — O09523 Supervision of elderly multigravida, third trimester: Secondary | ICD-10-CM

## 2024-06-14 DIAGNOSIS — D6859 Other primary thrombophilia: Secondary | ICD-10-CM | POA: Diagnosis present

## 2024-06-14 DIAGNOSIS — R109 Unspecified abdominal pain: Principal | ICD-10-CM | POA: Diagnosis present

## 2024-06-14 DIAGNOSIS — O24410 Gestational diabetes mellitus in pregnancy, diet controlled: Secondary | ICD-10-CM

## 2024-06-14 DIAGNOSIS — O99413 Diseases of the circulatory system complicating pregnancy, third trimester: Secondary | ICD-10-CM | POA: Diagnosis present

## 2024-06-14 DIAGNOSIS — O10919 Unspecified pre-existing hypertension complicating pregnancy, unspecified trimester: Secondary | ICD-10-CM | POA: Diagnosis present

## 2024-06-14 DIAGNOSIS — K469 Unspecified abdominal hernia without obstruction or gangrene: Secondary | ICD-10-CM | POA: Diagnosis present

## 2024-06-14 DIAGNOSIS — T380X5A Adverse effect of glucocorticoids and synthetic analogues, initial encounter: Secondary | ICD-10-CM | POA: Diagnosis present

## 2024-06-14 DIAGNOSIS — J45909 Unspecified asthma, uncomplicated: Principal | ICD-10-CM

## 2024-06-14 DIAGNOSIS — J45901 Unspecified asthma with (acute) exacerbation: Secondary | ICD-10-CM

## 2024-06-14 DIAGNOSIS — K436 Other and unspecified ventral hernia with obstruction, without gangrene: Secondary | ICD-10-CM | POA: Diagnosis present

## 2024-06-14 DIAGNOSIS — O09293 Supervision of pregnancy with other poor reproductive or obstetric history, third trimester: Secondary | ICD-10-CM

## 2024-06-14 DIAGNOSIS — I5032 Chronic diastolic (congestive) heart failure: Secondary | ICD-10-CM | POA: Diagnosis present

## 2024-06-14 DIAGNOSIS — R1013 Epigastric pain: Secondary | ICD-10-CM

## 2024-06-14 DIAGNOSIS — O09291 Supervision of pregnancy with other poor reproductive or obstetric history, first trimester: Secondary | ICD-10-CM

## 2024-06-14 DIAGNOSIS — Z1152 Encounter for screening for COVID-19: Secondary | ICD-10-CM

## 2024-06-14 DIAGNOSIS — G8929 Other chronic pain: Secondary | ICD-10-CM | POA: Diagnosis present

## 2024-06-14 DIAGNOSIS — B9789 Other viral agents as the cause of diseases classified elsewhere: Secondary | ICD-10-CM | POA: Diagnosis present

## 2024-06-14 DIAGNOSIS — O34219 Maternal care for unspecified type scar from previous cesarean delivery: Secondary | ICD-10-CM | POA: Diagnosis present

## 2024-06-14 DIAGNOSIS — Z86718 Personal history of other venous thrombosis and embolism: Secondary | ICD-10-CM

## 2024-06-14 DIAGNOSIS — K449 Diaphragmatic hernia without obstruction or gangrene: Secondary | ICD-10-CM | POA: Diagnosis present

## 2024-06-14 DIAGNOSIS — O99513 Diseases of the respiratory system complicating pregnancy, third trimester: Principal | ICD-10-CM | POA: Diagnosis present

## 2024-06-14 DIAGNOSIS — K760 Fatty (change of) liver, not elsewhere classified: Secondary | ICD-10-CM

## 2024-06-14 DIAGNOSIS — O99613 Diseases of the digestive system complicating pregnancy, third trimester: Secondary | ICD-10-CM | POA: Diagnosis present

## 2024-06-14 DIAGNOSIS — K439 Ventral hernia without obstruction or gangrene: Secondary | ICD-10-CM | POA: Diagnosis present

## 2024-06-14 DIAGNOSIS — R1011 Right upper quadrant pain: Secondary | ICD-10-CM | POA: Diagnosis present

## 2024-06-14 DIAGNOSIS — O99119 Other diseases of the blood and blood-forming organs and certain disorders involving the immune mechanism complicating pregnancy, unspecified trimester: Secondary | ICD-10-CM | POA: Diagnosis present

## 2024-06-14 DIAGNOSIS — Z3A28 28 weeks gestation of pregnancy: Secondary | ICD-10-CM

## 2024-06-14 DIAGNOSIS — Z833 Family history of diabetes mellitus: Secondary | ICD-10-CM

## 2024-06-14 DIAGNOSIS — O99113 Other diseases of the blood and blood-forming organs and certain disorders involving the immune mechanism complicating pregnancy, third trimester: Secondary | ICD-10-CM | POA: Diagnosis present

## 2024-06-14 DIAGNOSIS — B348 Other viral infections of unspecified site: Secondary | ICD-10-CM | POA: Diagnosis present

## 2024-06-14 DIAGNOSIS — I110 Hypertensive heart disease with heart failure: Secondary | ICD-10-CM | POA: Diagnosis present

## 2024-06-14 DIAGNOSIS — O99213 Obesity complicating pregnancy, third trimester: Secondary | ICD-10-CM | POA: Diagnosis present

## 2024-06-14 DIAGNOSIS — O24419 Gestational diabetes mellitus in pregnancy, unspecified control: Secondary | ICD-10-CM | POA: Diagnosis present

## 2024-06-14 DIAGNOSIS — Z7951 Long term (current) use of inhaled steroids: Secondary | ICD-10-CM

## 2024-06-14 DIAGNOSIS — Z8679 Personal history of other diseases of the circulatory system: Secondary | ICD-10-CM

## 2024-06-14 DIAGNOSIS — J9601 Acute respiratory failure with hypoxia: Secondary | ICD-10-CM | POA: Diagnosis present

## 2024-06-14 DIAGNOSIS — R21 Rash and other nonspecific skin eruption: Secondary | ICD-10-CM | POA: Diagnosis not present

## 2024-06-14 LAB — CBC
HCT: 33.6 % — ABNORMAL LOW (ref 36.0–46.0)
Hematocrit: 33.9 % — ABNORMAL LOW (ref 34.0–46.6)
Hemoglobin: 10.3 g/dL — ABNORMAL LOW (ref 11.1–15.9)
Hemoglobin: 10.8 g/dL — ABNORMAL LOW (ref 12.0–15.0)
MCH: 26.7 pg (ref 26.6–33.0)
MCH: 27.1 pg (ref 26.0–34.0)
MCHC: 30.4 g/dL — ABNORMAL LOW (ref 31.5–35.7)
MCHC: 32.1 g/dL (ref 30.0–36.0)
MCV: 88 fL (ref 79–97)
MCV: 84.2 fL (ref 80.0–100.0)
Platelets: 220 x10E3/uL (ref 150–450)
Platelets: 227 K/uL (ref 150–400)
RBC: 3.86 x10E6/uL (ref 3.77–5.28)
RBC: 3.99 MIL/uL (ref 3.87–5.11)
RDW: 17.3 % — ABNORMAL HIGH (ref 11.7–15.4)
RDW: 17.4 % — ABNORMAL HIGH (ref 11.5–15.5)
WBC: 9.5 x10E3/uL (ref 3.4–10.8)
WBC: 11.7 K/uL — ABNORMAL HIGH (ref 4.0–10.5)
nRBC: 0 % (ref 0.0–0.2)

## 2024-06-14 LAB — URINALYSIS, ROUTINE W REFLEX MICROSCOPIC
Bilirubin Urine: NEGATIVE
Glucose, UA: NEGATIVE mg/dL
Hgb urine dipstick: NEGATIVE
Ketones, ur: NEGATIVE mg/dL
Leukocytes,Ua: NEGATIVE
Nitrite: NEGATIVE
Protein, ur: NEGATIVE mg/dL
Specific Gravity, Urine: 1.02 (ref 1.005–1.030)
pH: 7 (ref 5.0–8.0)

## 2024-06-14 LAB — PRO BRAIN NATRIURETIC PEPTIDE: Pro Brain Natriuretic Peptide: <50 pg/mL

## 2024-06-14 LAB — COMPREHENSIVE METABOLIC PANEL WITH GFR
ALT: 10 IU/L (ref 0–32)
ALT: 9 U/L (ref 0–44)
AST: 11 IU/L (ref 0–40)
AST: 17 U/L (ref 15–41)
Albumin: 3 g/dL — ABNORMAL LOW (ref 3.9–4.9)
Albumin: 2.9 g/dL — ABNORMAL LOW (ref 3.5–5.0)
Alkaline Phosphatase: 81 IU/L (ref 41–116)
Alkaline Phosphatase: 82 U/L (ref 38–126)
Anion gap: 8 (ref 5–15)
BUN/Creatinine Ratio: 9 (ref 9–23)
BUN: 6 mg/dL (ref 6–24)
BUN: 8 mg/dL (ref 6–20)
Bilirubin Total: <0.2 mg/dL (ref 0.0–1.2)
CO2: 23 mmol/L (ref 20–29)
CO2: 25 mmol/L (ref 22–32)
Calcium: 8.6 mg/dL — ABNORMAL LOW (ref 8.7–10.2)
Calcium: 9.1 mg/dL (ref 8.9–10.3)
Chloride: 99 mmol/L (ref 96–106)
Chloride: 102 mmol/L (ref 98–111)
Creatinine, Ser: 0.64 mg/dL (ref 0.57–1.00)
Creatinine, Ser: 0.57 mg/dL (ref 0.44–1.00)
GFR, Estimated: >60 mL/min
Globulin, Total: 2.6 g/dL (ref 1.5–4.5)
Glucose, Bld: 100 mg/dL — ABNORMAL HIGH (ref 70–99)
Glucose: 165 mg/dL — ABNORMAL HIGH (ref 70–99)
Potassium: 4.3 mmol/L (ref 3.5–5.2)
Potassium: 4.8 mmol/L (ref 3.5–5.1)
Sodium: 137 mmol/L (ref 134–144)
Sodium: 135 mmol/L (ref 135–145)
Total Bilirubin: 0.2 mg/dL (ref 0.0–1.2)
Total Protein: 5.6 g/dL — ABNORMAL LOW (ref 6.0–8.5)
Total Protein: 6.2 g/dL — ABNORMAL LOW (ref 6.5–8.1)
eGFR: 114 mL/min/1.73

## 2024-06-14 LAB — HIV ANTIBODY (ROUTINE TESTING W REFLEX): HIV Screen 4th Generation wRfx: NONREACTIVE

## 2024-06-14 LAB — PROTEIN / CREATININE RATIO, URINE
Creatinine, Urine: 165 mg/dL
Protein Creatinine Ratio: 0.1 mg/mg
Total Protein, Urine: 23 mg/dL

## 2024-06-14 LAB — GLUCOSE TOLERANCE, 2 HOURS W/ 1HR
Glucose, 1 hour: 170 mg/dL (ref 70–179)
Glucose, 2 hour: 89 mg/dL (ref 70–152)
Glucose, Fasting: 119 mg/dL — ABNORMAL HIGH (ref 70–91)

## 2024-06-14 LAB — SYPHILIS: RPR W/REFLEX TO RPR TITER AND TREPONEMAL ANTIBODIES, TRADITIONAL SCREENING AND DIAGNOSIS ALGORITHM: RPR Ser Ql: NONREACTIVE

## 2024-06-14 LAB — TROPONIN T, HIGH SENSITIVITY: Troponin T High Sensitivity: <15 ng/L (ref 0–19)

## 2024-06-14 MED ORDER — ALBUTEROL SULFATE (2.5 MG/3ML) 0.083% IN NEBU
2.5000 mg | INHALATION_SOLUTION | Freq: Once | RESPIRATORY_TRACT | Status: AC
  Administered 2024-06-14: 2.5 mg via RESPIRATORY_TRACT
  Filled 2024-06-14: qty 3

## 2024-06-14 MED ORDER — HYDROMORPHONE HCL 1 MG/ML IJ SOLN
2.0000 mg | Freq: Once | INTRAMUSCULAR | Status: AC
  Administered 2024-06-14: 2 mg via INTRAVENOUS
  Filled 2024-06-14: qty 2

## 2024-06-14 MED ORDER — ONDANSETRON 4 MG PO TBDP
4.0000 mg | ORAL_TABLET | Freq: Once | ORAL | Status: AC
  Administered 2024-06-14: 4 mg via ORAL
  Filled 2024-06-14: qty 1

## 2024-06-14 NOTE — MAU Note (Signed)
 Brooke Sandoval is a 38 y.o. at [redacted]w[redacted]d here in MAU reporting: Feeling SOB and had labored breathing, headache, RUQ pain that is constant, dizziness and feels flushed today. Denies any N/V/D. Reports increase in BLE swelling that is +2 pitting edema.   States she fell yesterday and hit her right knee, right lower abdomen, and right arm. Had an NST in the office yesterday that was reactive.   Reports DFM today. No fetal movement since 1400. Denies any CTXS, LOF or VB. States she pees on herself every time she coughs. Took Excedrin at 1600 with no relief. Had labs drawn yesterday but no one has called to talk to her about results yet.   Onset of complaint: worse today  Pain score: RUQ 8  HA 8 Vitals:   06/14/24 1807  BP: 126/65  Pulse: 93  Resp: 18  Temp: 97.9 F (36.6 C)  SpO2: 100%     [id number] Lab orders placed from triage:

## 2024-06-14 NOTE — MAU Provider Note (Cosign Needed Addendum)
 "  Chief Complaint:  Shortness of Breath, Headache, and Dizziness   Event Date/Time   First Provider Initiated Contact with Patient 06/14/24 2116      HPI: Brooke Sandoval is a 38 y.o. 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 She fell yesterday and hit her knee into her abdomen and hit her left arm.  NST was normal in the office after the fall but the pain started in her abdomen and arm today. She reports good fetal movement, denies LOF, vaginal bleeding.     HPI  Past Medical History: Past Medical History:  Diagnosis Date   ADHD (attention deficit hyperactivity disorder)    Anxiety    Attention deficit hyperactivity disorder (ADHD), 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  Prescribed by [facility] in [place].  She DC'd with + UPT but would like to restart.  Counseled not recommended in pregnancy.  Counseled to establish with [facility] ASAP as this clinic will not prescribe those types of meds.  Verbal and written information provided today.  Will discuss with MFM at [facility] .     If symptoms not well controlled with counseling/behavioral therapy, re   CHF (congestive heart failure) (HCC)    after admission for vetnal hernia   Coagulation factor disorder    DVT (deep venous thrombosis) (HCC)    History of chlamydia infection 09/12/2014   3/19 Called in Azithromycin Rx for positive chlamydia.   4/28:  TOC NEG         02/25/2015  TOC neg 8/10     History of gestational diabetes    History of pre-eclampsia    Miscarriage    Poor dentition 10/10/2014   Records note poor dentition in prior records. Please ensure she is getting regular dental care.     Poor intravenous access 12/09/2014   In last pregnancy, required port access.      Past obstetric history: OB History  Gravida Para  Term Preterm AB Living  9 7  7 1 5   SAB IAB Ectopic Multiple Live Births  1    5    # Outcome Date GA Lbr Len/2nd Weight Sex Type Anes PTL Lv  9 Current           8 Preterm 04/06/15 [redacted]w[redacted]d   M CS-LTranv   LIV  7 SAB 2014 [redacted]w[redacted]d         6 Preterm 04/14/11 [redacted]w[redacted]d    CS-LTranv   LIV  5 Preterm 02/2010 [redacted]w[redacted]d   M Vag-Spont   FD  4 Preterm 09/23/07 [redacted]w[redacted]d   F CS-LTranv   LIV  3 Preterm 07/26/06 [redacted]w[redacted]d   M CS-LTranv   LIV  2 Preterm 2007 [redacted]w[redacted]d    Vag-Spont   FD     Complications: Clotting disorder  1 Preterm 09/15/03 [redacted]w[redacted]d   M CS-LTranv   LIV    Obstetric Comments  Hx GDM, pre-E, clotting disorder, two fetal demise due to blood clot in placenta per pt.  Past deliveries at [facility] and in [place] .    Past Surgical History: Past Surgical History:  Procedure Laterality Date   APPENDECTOMY     CESAREAN SECTION     TONSILLECTOMY     VENTRAL HERNIA REPAIR      Family History: Family History  Problem Relation  Age of Onset   Depression Mother    COPD Mother    Diabetes Mother    Schizophrenia Father    Drug abuse Sister     Social History: Social History[1]  Allergies: Allergies[2]  Meds:  Medications Prior to Admission  Medication Sig Dispense Refill Last Dose/Taking   albuterol  (VENTOLIN  HFA) 108 (90 Base) MCG/ACT inhaler Inhale 1-2 puffs into the lungs every 6 (six) hours as needed for wheezing or shortness of breath. 6.7 g 1 Past Month   aspirin  (ASPIRIN  CHILDRENS) 81 MG chewable tablet Chew 2 tablets (162 mg total) by mouth daily. 60 tablet 11 Past Week   budesonide -formoterol  (SYMBICORT ) 160-4.5 MCG/ACT inhaler Inhale 2 puffs into the lungs 2 (two) times daily. 10.2 g 12 06/14/2024   docusate sodium  (COLACE) 100 MG capsule Take 1 capsule (100 mg total) by mouth 2 (two) times daily. 60 capsule 2 06/13/2024   enoxaparin  (LOVENOX ) 100 MG/ML injection Inject 1 mL (100 mg total) into the skin every 12 (twelve) hours. 60 mL 11 06/14/2024 at  2:00 PM   furosemide  (LASIX ) 40 MG tablet  Take 1 tablet (40 mg total) by mouth 2 (two) times daily. 60 tablet 5 06/14/2024   hydrOXYzine  (ATARAX ) 25 MG tablet Take 1 tablet (25 mg total) by mouth 3 (three) times daily as needed for anxiety. 30 tablet 0 Past Week   ipratropium-albuterol  (DUONEB) 0.5-2.5 (3) MG/3ML SOLN Take 3 mLs by nebulization every 6 (six) hours as needed. 360 mL 0 06/14/2024 at  2:00 PM   Iron , Ferrous Sulfate , 325 (65 Fe) MG TABS Take 1 tablet by mouth every other day. 30 tablet 3 Past Week   labetalol  (NORMODYNE ) 200 MG tablet Take 1 tablet (200 mg total) by mouth 2 (two) times daily. 60 tablet 0 06/14/2024 at  4:30 PM   ondansetron  (ZOFRAN -ODT) 4 MG disintegrating tablet Take 1 tablet (4 mg total) by mouth every 8 (eight) hours as needed for nausea vomitting 90 tablet 0 Past Week   Prenatal Vit-Fe Fumarate-FA (MULTIVITAMIN-PRENATAL) 27-0.8 MG TABS tablet Take 1 tablet by mouth daily at 12 noon.   Past Week   Blood Pressure Monitoring DEVI 1 each by Does not apply route once a week. (Patient not taking: Reported on 05/15/2024) 1 each 0    oxyCODONE  (OXY IR/ROXICODONE ) 5 MG immediate release tablet Take 1 tablet (5 mg total) by mouth every 4 (four) hours as needed for severe pain (pain score 7-10) or breakthrough pain. 10 tablet 0     ROS:  Review of Systems  Constitutional:  Negative for chills, fatigue and fever.  Eyes:  Negative for visual disturbance.  Respiratory:  Positive for shortness of breath.   Cardiovascular:  Negative for chest pain.  Gastrointestinal:  Positive for abdominal pain. Negative for nausea and vomiting.  Genitourinary:  Negative for difficulty urinating, dysuria, flank pain, pelvic pain, vaginal bleeding, vaginal discharge and vaginal pain.  Musculoskeletal:  Positive for myalgias.       Pain in right arm  Neurological:  Positive for dizziness and headaches.  Psychiatric/Behavioral: Negative.       I have reviewed patient's Past Medical Hx, Surgical Hx, Family Hx, Social Hx, medications  and allergies.   Physical Exam  Patient Vitals for the past 24 hrs:  BP Temp Temp src Pulse Resp SpO2 Height Weight  06/14/24 1811 138/89 -- -- -- -- -- -- --  06/14/24 1807 126/65 97.9 F (36.6 C) Oral 93 18 100 % 5' 8 (1.727 m) (!) 185.5  kg   Constitutional: Well-developed, well-nourished female in no acute distress.  HEART: normal rate, heart sounds, regular rhythm RESP: normal effort, lung sounds clear and equal bilaterally  GI: Abd soft, non-tender, gravid appropriate for gestational age.  MS: Extremities nontender, no edema, normal ROM except for right arm, which pt cannot raise above shoulder level Neurologic: Alert and oriented x 4.  GU: Neg CVAT.  PELVIC EXAM:      FHT:  Unable to trace with external monitor, bedside US  with FHR 140s.  BPP ordered.   Labs: Results for orders placed or performed during the hospital encounter of 06/14/24 (from the past 24 hours)  Protein / creatinine ratio, urine     Status: None   Collection Time: 06/14/24  8:11 PM  Result Value Ref Range   Creatinine, Urine 165 mg/dL   Total Protein, Urine 23 mg/dL   Protein Creatinine Ratio 0.1 <0.2 mg/mg  Urinalysis, Routine w reflex microscopic -Urine, Clean Catch     Status: Abnormal   Collection Time: 06/14/24  8:11 PM  Result Value Ref Range   Color, Urine YELLOW YELLOW   APPearance CLEAR CLEAR   Specific Gravity, Urine 1.020 1.005 - 1.030   pH 7.0 5.0 - 8.0   Glucose, UA NEGATIVE NEGATIVE mg/dL   Hgb urine dipstick NEGATIVE NEGATIVE   Bilirubin Urine NEGATIVE NEGATIVE   Ketones, ur NEGATIVE NEGATIVE mg/dL   Protein, ur NEGATIVE NEGATIVE mg/dL   Nitrite NEGATIVE NEGATIVE   Leukocytes,Ua NEGATIVE NEGATIVE   RBC / HPF 0-5 0 - 5 RBC/hpf   WBC, UA 0-5 0 - 5 WBC/hpf   Bacteria, UA RARE (A) NONE SEEN   Squamous Epithelial / HPF 6-10 0 - 5 /HPF   Mucus PRESENT   CBC     Status: Abnormal   Collection Time: 06/14/24  8:30 PM  Result Value Ref Range   WBC 11.7 (H) 4.0 - 10.5 K/uL   RBC 3.99  3.87 - 5.11 MIL/uL   Hemoglobin 10.8 (L) 12.0 - 15.0 g/dL   HCT 66.3 (L) 63.9 - 53.9 %   MCV 84.2 80.0 - 100.0 fL   MCH 27.1 26.0 - 34.0 pg   MCHC 32.1 30.0 - 36.0 g/dL   RDW 82.5 (H) 88.4 - 84.4 %   Platelets 227 150 - 400 K/uL   nRBC 0.0 0.0 - 0.2 %   --/--/A POS (12/02 1510)  Imaging:  CT ABDOMEN PELVIS W CONTRAST Result Date: 05/30/2024 EXAM: CT ABDOMEN AND PELVIS WITH CONTRAST 05/29/2024 03:29:00 PM TECHNIQUE: CT of the abdomen and pelvis was performed with the administration of 75 mL iohexol  (OMNIPAQUE ) 350 MG/ML injection. Multiplanar reformatted images are provided for review. Automated exposure control, iterative reconstruction, and/or weight-based adjustment of the mA/kV was utilized to reduce the radiation dose to as low as reasonably achievable. COMPARISON: Findings are compared to 07/24/2020. CLINICAL HISTORY: Hernia suspected, abdominal wall. FINDINGS: LOWER CHEST: No acute abnormality. LIVER: The liver is unremarkable. GALLBLADDER AND BILE DUCTS: Gallbladder is unremarkable. No biliary ductal dilatation. SPLEEN: No acute abnormality. PANCREAS: No acute abnormality. ADRENAL GLANDS: No acute abnormality. KIDNEYS, URETERS AND BLADDER: No stones in the kidneys or ureters. No hydronephrosis. No perinephric or periureteral stranding. Urinary bladder is unremarkable. GI AND BOWEL: A large right parasagittal infraumbilical abdominal wall hernia is again identified containing multiple unremarkable loops of small bowel. This appears similar to the prior examination, with the hernia defect measuring 6.3 x 9.4 cm and the hernia sac itself measuring 17.1 x 20.8 x 15.5  cm in greatest dimension. A broad-based, shallow, small left parasagittal infraumbilical fat-containing ventral hernia, is also noted . The stomach, small bowel, and large bowel are otherwise unremarkable. There is no bowel obstruction. PERITONEUM AND RETROPERITONEUM: No ascites. No free air. VASCULATURE: IS ALSO NOTED. Aorta is normal  in caliber. LYMPH NODES: No lymphadenopathy. REPRODUCTIVE ORGANS: Single fetus in cephalic presentation within the gravid uterus. BONES AND SOFT TISSUES: No acute osseous abnormality. No focal soft tissue abnormality. IMPRESSION: 1. Large right parasagittal infraumbilical abdominal wall hernia containing multiple unremarkable loops of small bowel, similar to the prior examination. 2. Small left parasagittal infraumbilical fat-containing ventral hernia. Electronically signed by: Dorethia Molt MD 05/30/2024 04:34 AM EST RP Workstation: [id number]   US  MFM OB LIMITED Result Date: 05/29/2024 ----------------------------------------------------------------------  OBSTETRICS REPORT                       (Signed Final 05/29/2024 03:56 pm) ---------------------------------------------------------------------- Patient Info  ID #:       [id number]                          D.O.B.:  1985-08-18 (38 yrs)(F)  Name:       Brooke Sandoval                  Visit Date: 05/29/2024 10:17 am ---------------------------------------------------------------------- Performed By  Attending:        Delora Smaller DO       Ref. Address:     [address]                                                             [place], [place]                                                             [address]  Performed By:     Burnard LITTIE Custard          Secondary Phy.:   [facility]                    RDMS                                                             Care  Referred By:      GLENYS GORMAN BIRK          [facility] ---------------------------------------------------------------------- Orders  #  Description  Code        Ordered By  1  US  MFM OB LIMITED                     [id number]    GLENYS BIRK ----------------------------------------------------------------------  #  Order #                     Accession #                Episode #  1   [id number]                   [id number]                 [id number] ---------------------------------------------------------------------- Indications  Decreased fetal movement                       O36.8190  [redacted] weeks gestation of pregnancy                Z3A.25  Unable to hear fetal heart tones as reason     O76  for ultrasound  Obesity complicating pregnancy, second         O99.212  trimester (BMI 57) ---------------------------------------------------------------------- Fetal Evaluation  Num Of Fetuses:         1  Fetal Heart Rate(bpm):  140  Cardiac Activity:       Observed  Presentation:           Cephalic  Placenta:               Anterior  Amniotic Fluid  AFI FV:      Within normal limits                              Largest Pocket(cm)                              5.3  Comment:    Good fetal movement seen. ---------------------------------------------------------------------- OB History  Gravidity:    9         Term:   0        Prem:   7        SAB:   1  TOP:          0       Ectopic:  0        Living: 5 ---------------------------------------------------------------------- Gestational Age  Best:          25w 5d     Det. By:  Early Ultrasound         EDD:   09/06/24                                      (01/22/24) ---------------------------------------------------------------------- Anatomy  Stomach:               Appears normal, left   Bladder:                Appears normal                         sided ---------------------------------------------------------------------- Cervix Uterus Adnexa  Cervix  Not visualized (advanced GA >24wks)  Adnexa  No abnormality visualized ---------------------------------------------------------------------- Comments  Hospital Ultrasound  The  patient presented to the MAU for decreased FM. US  was  ordered due to inability to find the fetal heart rate likely due to  maternal body habitus.  Sonographic findings  Single intrauterine pregnancy at 25w 5d  Fetal cardiac activity:  Observed and appears normal.  Presentation: Cephalic.  Amniotic fluid volume: Within normal limits. MVP: 5.3 cm.  Placenta: Anterior.  Recommendations  - Continue clinical management per OB provider.  This was a limited ultrasound with a remote read. If an official  MFM consult is requested for any reason please call/place an  order in Epic. ----------------------------------------------------------------------                  Delora Smaller, DO Electronically Signed Final Report   05/29/2024 03:56 pm ----------------------------------------------------------------------   CT Angio Chest Pulmonary Embolism (PE) W or WO Contrast Result Date: 05/29/2024 EXAM: CTA of the Chest with contrast for PE 05/29/2024 03:29:00 PM TECHNIQUE: CTA of the chest was performed after the administration of 75 mL of iohexol  (OMNIPAQUE ) 350 MG/ML injection. Multiplanar reformatted images are provided for review. MIP images are provided for review. Automated exposure control, iterative reconstruction, and/or weight based adjustment of the mA/kV was utilized to reduce the radiation dose to as low as reasonably achievable. COMPARISON: None available. CLINICAL HISTORY: Pulmonary embolism (PE) suspected, high prob. FINDINGS: PULMONARY ARTERIES: Limited exam due to soft tissue attenuation artifact secondary to body habitus. There is no definite evidence of large pulmonary embolus seen in the main pulmonary artery or the proximal portions of the left and right pulmonary arteries. There is limited evaluation of the more peripheral branches due to above artifact and therefore smaller and more peripheral pulmonary emboli cannot be excluded on the basis of this exam. Main pulmonary artery is normal in caliber. MEDIASTINUM: The heart and pericardium demonstrate no acute abnormality. There is no acute abnormality of the thoracic aorta. LYMPH NODES: No mediastinal, hilar or axillary lymphadenopathy. LUNGS AND PLEURA: The lungs are without acute  process. No focal consolidation or pulmonary edema. No pleural effusion or pneumothorax. UPPER ABDOMEN: Limited images of the upper abdomen are unremarkable. SOFT TISSUES AND BONES: No acute bone or soft tissue abnormality. IMPRESSION: 1. No definite evidence of large pulmonary embolus in the main pulmonary artery or proximal portions of the left and right pulmonary arteries. 2. Evaluation of the peripheral pulmonary arterial branches is limited by soft tissue attenuation from body habitus, and smaller or more peripheral emboli cannot be excluded. Electronically signed by: Lynwood Seip MD 05/29/2024 03:51 PM EST RP Workstation: [id number]   VAS US  LOWER EXTREMITY VENOUS (DVT) Result Date: 05/28/2024  Lower Venous DVT Study Patient Name:  AMATULLAH CHRISTY  Date of Exam:   05/28/2024 Medical Rec #: [id number]       Accession #:    [id number] Date of Birth: October 06, 1985        Patient Gender: F Patient Age:   74 years Exam Location:  [facility] Procedure:      VAS US  LOWER EXTREMITY VENOUS (DVT) Referring Phys: SUZEN NEWTON --------------------------------------------------------------------------------  Indications: Pulmonary embolism. Other Indications: [redacted] weeks pregnant. Anticoagulation: Lovenox . Comparison Study: Previous study on the left lower extremity on 10.27.2023. Performing Technologist: Edilia Elden Appl  Examination Guidelines: A complete evaluation includes B-mode imaging, spectral Doppler, color Doppler, and power Doppler as needed of all accessible portions of each vessel. Bilateral testing is considered an integral part of a complete examination. Limited examinations for reoccurring indications may be performed as noted. The reflux portion of  the exam is performed with the patient in reverse Trendelenburg.  +---------+---------------+---------+-----------+----------+----------------+ RIGHT    CompressibilityPhasicitySpontaneityPropertiesThrombus Aging    +---------+---------------+---------+-----------+----------+----------------+ CFV      Full           Yes      Yes                                   +---------+---------------+---------+-----------+----------+----------------+ SFJ      Full           Yes      Yes                                   +---------+---------------+---------+-----------+----------+----------------+ FV Prox  Full                                                          +---------+---------------+---------+-----------+----------+----------------+ FV Mid   Full                                                          +---------+---------------+---------+-----------+----------+----------------+ FV DistalFull                                                          +---------+---------------+---------+-----------+----------+----------------+ PFV      Full                                                          +---------+---------------+---------+-----------+----------+----------------+ POP      Full           Yes      Yes                                   +---------+---------------+---------+-----------+----------+----------------+ PTV                                                   Patent by color. +---------+---------------+---------+-----------+----------+----------------+ PERO                                                  Patent by color. +---------+---------------+---------+-----------+----------+----------------+   +---------+---------------+---------+-----------+----------+----------------+ LEFT     CompressibilityPhasicitySpontaneityPropertiesThrombus Aging   +---------+---------------+---------+-----------+----------+----------------+ CFV      Full           Yes      Yes                                   +---------+---------------+---------+-----------+----------+----------------+  SFJ      Full                                                           +---------+---------------+---------+-----------+----------+----------------+ FV Prox  Full                                                          +---------+---------------+---------+-----------+----------+----------------+ FV Mid   Full                                                          +---------+---------------+---------+-----------+----------+----------------+ FV DistalFull                                                          +---------+---------------+---------+-----------+----------+----------------+ PFV      Full                                                          +---------+---------------+---------+-----------+----------+----------------+ POP      Full           Yes      Yes                                   +---------+---------------+---------+-----------+----------+----------------+ PTV                                                   Patent by color. +---------+---------------+---------+-----------+----------+----------------+ PERO                                                  Patent by color. +---------+---------------+---------+-----------+----------+----------------+     Summary: RIGHT: - There is no evidence of deep vein thrombosis in the lower extremity. However, portions of this examination were limited- see technologist comments above.  - No cystic structure found in the popliteal fossa.  LEFT: - There is no evidence of deep vein thrombosis in the lower extremity. However, portions of this examination were limited- see technologist comments above.  - No cystic structure found in the popliteal fossa.  *See table(s) above for measurements and observations. Electronically signed by Debby Robertson on 05/28/2024 at 4:06:56 PM.    Final    ECHOCARDIOGRAM COMPLETE Result Date: 05/28/2024    ECHOCARDIOGRAM REPORT   Patient Name:  Syd Laver Date of Exam: 05/28/2024 Medical Rec #:  [id number]      Height:       67.0 in Accession  #:    [id number]     Weight:       392.4 lb Date of Birth:  1985-10-06       BSA:          2.693 m Patient Age:    38 years       BP:           123/55 mmHg Patient Gender: F              HR:           96 bpm. Exam Location:  Inpatient Procedure: 2D Echo, Cardiac Doppler and Color Doppler (Both Spectral and Color            Flow Doppler were utilized during procedure). Indications:    Congestive Heart Failure I50.9  History:        Patient has no prior history of Echocardiogram examinations.                 CHF.  Sonographer:    Jayson Gaskins Referring Phys: [id number] [facility] NILES NEWTON  Sonographer Comments: No Definity used due to pregnancy. IMPRESSIONS  1. Left ventricular ejection fraction, by estimation, is 55 to 60%. The left ventricle has normal function. Left ventricular endocardial border not optimally defined to evaluate regional wall motion. Left ventricular diastolic parameters were normal.  2. Right ventricular systolic function is normal. The right ventricular size is normal.  3. The mitral valve is grossly normal. Trivial mitral valve regurgitation.  4. The aortic valve was not well visualized. Aortic valve regurgitation is not visualized. Comparison(s): Technically difficutl evaluation but overall normal echocardiogram. FINDINGS  Left Ventricle: Left ventricular ejection fraction, by estimation, is 55 to 60%. The left ventricle has normal function. Left ventricular endocardial border not optimally defined to evaluate regional wall motion. The left ventricular internal cavity size was normal in size. There is borderline left ventricular hypertrophy. Left ventricular diastolic parameters were normal. Right Ventricle: The right ventricular size is normal. No increase in right ventricular wall thickness. Right ventricular systolic function is normal. Left Atrium: Left atrial size was normal in size. Right Atrium: Right atrial size was normal in size. Pericardium: There is no evidence of pericardial  effusion. Presence of epicardial fat layer. Mitral Valve: The mitral valve is grossly normal. Trivial mitral valve regurgitation. Tricuspid Valve: The tricuspid valve is grossly normal. Tricuspid valve regurgitation is trivial. Aortic Valve: The aortic valve was not well visualized. Aortic valve regurgitation is not visualized. Aortic valve mean gradient measures 5.0 mmHg. Aortic valve peak gradient measures 8.3 mmHg. Aortic valve area, by VTI measures 3.37 cm. Pulmonic Valve: The pulmonic valve was not well visualized. Pulmonic valve regurgitation is not visualized. Aorta: The aortic root and ascending aorta are structurally normal, with no evidence of dilitation. IAS/Shunts: The interatrial septum was not well visualized.  LEFT VENTRICLE PLAX 2D LVIDd:         4.20 cm   Diastology LVIDs:         3.40 cm   LV e' medial:    7.40 cm/s LV PW:         1.20 cm   LV E/e' medial:  10.6 LV IVS:        0.90 cm   LV e' lateral:   11.20 cm/s LVOT diam:     2.00 cm  LV E/e' lateral: 7.0 LV SV:         89 LV SV Index:   33 LVOT Area:     3.14 cm  RIGHT VENTRICLE RV S prime:     13.40 cm/s TAPSE (M-mode): 2.4 cm LEFT ATRIUM             Index        RIGHT ATRIUM           Index LA Vol (A2C):   35.9 ml 13.33 ml/m  RA Area:     10.10 cm LA Vol (A4C):   51.1 ml 18.97 ml/m  RA Volume:   19.80 ml  7.35 ml/m LA Biplane Vol: 42.7 ml 15.86 ml/m  AORTIC VALVE AV Area (Vmax):    2.90 cm AV Area (Vmean):   2.92 cm AV Area (VTI):     3.37 cm AV Vmax:           144.00 cm/s AV Vmean:          112.000 cm/s AV VTI:            0.264 m AV Peak Grad:      8.3 mmHg AV Mean Grad:      5.0 mmHg LVOT Vmax:         133.00 cm/s LVOT Vmean:        104.000 cm/s LVOT VTI:          0.283 m LVOT/AV VTI ratio: 1.07  AORTA Ao Root diam: 2.70 cm Ao Asc diam:  3.70 cm MITRAL VALVE MV Area (PHT): 3.21 cm    SHUNTS MV Decel Time: 236 msec    Systemic VTI:  0.28 m MV E velocity: 78.10 cm/s  Systemic Diam: 2.00 cm MV A velocity: 91.20 cm/s MV E/A ratio:   0.86 Franck Azobou Tonleu Electronically signed by Joelle Cedars Tonleu Signature Date/Time: 05/28/2024/9:32:56 AM    Final    DG Chest Portable 1 View Result Date: 05/27/2024 CLINICAL DATA:  Shortness of breath.  Cough. EXAM: PORTABLE CHEST 1 VIEW COMPARISON:  Radiograph 05/15/2024 FINDINGS: The cardiomediastinal contours are normal. The lungs are clear. Pulmonary vasculature is normal. No consolidation, pleural effusion, or pneumothorax. No acute osseous abnormalities are seen. IMPRESSION: No acute findings. Electronically Signed   By: Andrea Gasman M.D.   On: 05/27/2024 19:47   US  MFM OB FOLLOW UP Result Date: 05/27/2024 ----------------------------------------------------------------------  OBSTETRICS REPORT                       (Signed Final 05/27/2024 02:43 pm) ---------------------------------------------------------------------- Patient Info  ID #:       [id number]                          D.O.B.:  06/13/1986 (38 yrs)(F)  Name:       Brooke Sandoval                  Visit Date: 05/27/2024 02:04 pm ---------------------------------------------------------------------- Performed By  Attending:        Nathanel Fetters      Ref. Address:     [address]                    [place]  [place], [place]                                                             [address]  Performed By:     Meade Parker       [facility]  Referred By:      [facility] ---------------------------------------------------------------------- Orders  #  Description                           Code        Ordered By  1  US  MFM OB FOLLOW UP                   [phone]    BABARA KEYS  ----------------------------------------------------------------------  #  Order #                     Accession #                Episode #  1  [id number]                   [id number]                 [id number] ---------------------------------------------------------------------- Indications  [redacted] weeks gestation of pregnancy                Z3A.25  Abdominal pain in pregnancy                    O99.89  Obesity complicating pregnancy, second         O99.212  trimester (BMI 69)  Advanced maternal age multigravida 3+,        O2.522  second trimester (38 yrs)  Previous cesarean delivery, antepartum (x5)    O33.219  Grand multiparity, antepartum                  O09.40  Poor obstetric history: Previous preterm       O09.219  delivery, antepartum (No term deliveries)  Pre-existing essential hypertension            O10.012  complicating pregnancy, second trimester  Medical complication of pregnancy (Hepatic     O26.90  steatosis, ventral hernia)(Hx of DVT, SUD,  CHF [Lasix ], bowel injury)  Poor obstetric history: Previous               O09.299  preeclampsia, GDM, PE (on Lovenox ),  HELLP  Protein S deficiency complicating pregnancy    O99.199  Poor obstetric history: Previous IUFD          O63.299  (stillbirth) x2, 24w 2007, 24w 2011)  Abnormal chromosomal and genetic finding       O28.5  on antenatal screening of mother (Low fetal  fraction on Panorama, LOW RISK [facility]  NIPS)  Encounter for other antenatal screening        Z36.2  follow-up  Obesity complicating pregnancy, second         O99.212  trimester (BMI 57)  Encounter for other antenatal screening        Z36.2  follow-up ---------------------------------------------------------------------- Fetal Evaluation  Num Of Fetuses:         1  Cardiac Activity:       Observed  Presentation:           Transverse, head to maternal left  Placenta:               Anterior  P. Cord Insertion:      Previously seen  Amniotic Fluid  AFI FV:      Within normal limits                               Largest Pocket(cm)                              5.28 ---------------------------------------------------------------------- Biometry  BPD:      66.2  mm     G. Age:  26w 5d         82  %    CI:        80.28   %    70 - 86                                                          FL/HC:      20.3   %    18.7 - 20.3  HC:      233.4  mm     G. Age:  25w 3d         25  %    HC/AC:      1.09        1.04 - 1.22  AC:      214.3  mm     G. Age:  26w 0d         57  %    FL/BPD:     71.6   %    71 - 87  FL:       47.4  mm     G. Age:  25w 6d         49  %    FL/AC:      22.1   %  20 - 24  CER:      26.6  mm     G. Age:  23w 6d         14  %  LV:        4.8  mm  CM:        4.9  mm  Est. FW:     865  gm    1 lb 15 oz      60  % ---------------------------------------------------------------------- OB History  Gravidity:    9         Term:   0        Prem:   7        SAB:   1  TOP:          0       Ectopic:  0        Living: 5 ---------------------------------------------------------------------- Gestational Age  U/S Today:     26w 0d                                        EDD:   09/02/24  Best:          25w 3d     Det. By:  Early Ultrasound         EDD:   09/06/24                                      (01/22/24) ---------------------------------------------------------------------- Targeted Anatomy  Central Nervous System  Calvarium/Cranial V.:  Appears normal         Cereb./Vermis:          Not well visualized  Cavum:                 Appears normal         Cisterna Magna:         Not well visualized  Lateral Ventricles:    Appears normal         Midline Falx:           Appears normal  Choroid Plexus:        Not well visualized  Spine  Cervical:              Not well visualized    Sacral:                 Not well visualized  Thoracic:              Not well visualized    Shape/Curvature:        Not well visualized  Lumbar:                Not well visualized  Head/Neck  Lips:                  Not well visualized     Profile:                Appears normal  Neck:                  Not well visualized    Orbits/Eyes:            Lenses nwv  Nuchal Fold:           Not applicable  Mandible:               Not well visualized  Nasal Bone:            Present                Maxilla:                Not well visualized  Thorax  4 Chamber View:        Appears normal         Interventr. Septum:     Appears normal  Cardiac Rhythm:        Normal                 Cardiac Axis:           Appears normal  Cardiac Situs:         Appears normal         Diaphragm:              Appears normal  Rt Outflow Tract:      Not well visualized    3 Vessel View:          Appears normal  Lt Outflow Tract:      Appears normal         3 V Trachea View:       Not well visualized  Aortic Arch:           Not well visualized    IVC:                    Appears normal  Ductal Arch:           Previously seen        Crossing:               Appears normal  SVC:                   Appears normal  Abdomen  Ventral Wall:          Appears normal         Lt Kidney:              Not well visualized  Cord Insertion:        Appears normal         Rt Kidney:              Not well visualized  Situs:                 Appears normal         Bladder:                Previously seen  Stomach:               Appears normal  Extremities  Lt Humerus:            Previously seen        Lt Femur:               Previously seen  Rt Humerus:            Previously seen        Rt Femur:               Previously seen  Lt Forearm:            Previously seen        Lt Lower Leg:  Previously seen  Rt Forearm:            Previously seen        Rt Lower Leg:           Previously seen  Lt Hand:               Previously seen        Lt Foot:                limited lateral view  Rt Hand:               Not well visualized    Rt Foot:                Not well visualized  Other  Umbilical Cord:        Previously seen        Genitalia:              Female prev seen  Comment:     Technically difficult  due to hernia in maternal RLQ, BMI 57, and               fetal position. ---------------------------------------------------------------------- Cervix Uterus Adnexa  Cervix  Not visualized (advanced GA >24wks)  Uterus  No abnormality visualized.  Right Ovary  Not visualized.  Left Ovary  Not visualized.  Cul De Sac  No free fluid seen.  Adnexa  No abnormality visualized ---------------------------------------------------------------------- Impression  MFM Consultation  Ms. Kesinger is a 38 yo G9P5 at 34 w 3 d with an EDD of  09/06/24.  She is here for follow up growth exam.  She has multiple issue complicating her pregnancy  Most notably  CHTN  BMI 50  CHF managed with Lasix   H/o DVT on Lovenox .  She denies s/sx of PTL or Preeclampsia however, she  complains of worsening SOB and coughing at night when she  lays down. She says she cannot sleep at night either.  She has a f/u maternal echo today and scheduled to see  cardiology on Friday.           05/27/2024    1:17 PM       05/15/2024    9:30 PM  PM       05/15/2024    5:49 PM  Vitals with BMI  Systolic 136      124      145  Diastolic92       69       82  Pulse             89       95  Imaging:  SIUP with measurements consistent with dates  Good fetal movement and amniotic fluid again observed  Suboptimal views of the fetal anatomy was again seen  secondary to fetal position and maternal habitus.  Impression/Counseling:  1) CHF- I discussed with Ms Gardiner her symptoms and  recommended she go to MAU for further evaluation after her  echo (they will likely want to get one anyway).  May need additional Lasix . Will see after echocardiogram.  Consider consulting cards if admitted.  BNP on 11/20 was 20.  2) CHTN-BP stable on Labetalol   3) h/o PE on Lovenox .  -Needs AntiXa assessment again.  Repeat growth scheduled in 3-4 weeks.  I spent 30 minutes with > 50% in face to face consultation  Nathanel DOROTHA Fetters, MD  ----------------------------------------------------------------------  Nathanel Fetters, MD Electronically Signed Final Report   05/27/2024 02:43 pm ----------------------------------------------------------------------    MAU Course/MDM: Orders Placed This Encounter  Procedures   CT Angio Chest PE W and/or Wo Contrast   US  MFM Fetal BPP Wo Non Stress   CBC   Comprehensive metabolic panel with GFR   Protein / creatinine ratio, urine   Urinalysis, Routine w reflex microscopic -Urine, Clean Catch   Pro Brain natriuretic peptide    Meds ordered this encounter  Medications   HYDROmorphone  (DILAUDID ) injection 2 mg    Refill:  0   albuterol  (PROVENTIL ) (2.5 MG/3ML) 0.083% nebulizer solution 2.5 mg     Unable to complete NST given body habitus PEC labs, BNP, and troponin wnl Dilaudid  2 mg IV given for pain  BPP ordered Consult Dr Abigail with assessment and findings Albuterol  nebulizer tx ordered Right arm XR ordered RUQ US  ordered PEC labs ordered  Report to [place]  [name], CNM with labs/imaging pending   Olam Boards Certified Nurse-Midwife 06/14/2024 9:17 PM    Assumed care of patient from [facility]. Still waiting for CT.  RN checked with status of getting patient to CT.  They are not able to take her yet due to ED being very busy.   3:00: Patient reports that pain is returning.  Appropriate given that dose of Dilaudid  as well as 6 hours ago.  Will repeat Dilaudid .  Pt unable to tolerate CT. Called Dr. Abigail to determine plan of care and dispo.   [place]  [name] HOWARD 06/15/2024 5:36 AM   0530 Patient seen and re-evaluated. Reports she does not feel short of breath and those symptoms have resolved. She was unable to tolerate CT so CT PE and CT A/P not performed.  RUQ ultrasound was unremarkable Right elbow xray without acute fracture She continues to report RUQ pain She also reports decreased fetal movement BPP was 6/8, placenta appears  normal Abdominal exam is benign, she has some mild tenderness with deep palpation in the RUQ but no rebound/guarding. Overall reassuring. I reviewed with her that without CT imaging I am unable to rule out PE or incarcerated hernia. It is reassuring that she is not having nausea/vomiting and she does not have an acute abdomen. We discussed that we can do a CXR to evaluate for a rib fracture Also recommend re-attempt at [facility] monitoring on NST  Rollo ONEIDA Abigail, MD, FACOG Obstetrician & Gynecologist, [facility] for [facility], [facility]   [phone] Patient seen and re-evaluated. Crying out in pain NST is reactive though spotty due to maternal body habitus Will admit for pain management, and re-attempt abdominal imaging if patient able to tolerate, may premedicate more  Attestation of Attending Supervision of Advanced Practice Provider (CNM/NP/PA): Evaluation and management procedures were performed by the Advanced Practice Provider under my supervision and collaboration unless otherwise noted above.  I have reviewed the Advanced Practice Provider's note and chart, and I agree with the management and plan. I have also made any necessary editorial changes.   Rollo ONEIDA Abigail, MD Attending Obstetrician & Gynecologist, [facility] for [facility], [facility] 06/15/2024 6:54 AM    [1]  Social History Tobacco Use   Smoking status: Never    Passive exposure: Never   Smokeless tobacco: Never  Vaping Use   Vaping status: Never Used  Substance Use Topics   Alcohol use: Not Currently    Comment: socially before pregnancy   Drug use: No  [2]  Allergies Allergen Reactions   Prednisone  Swelling   "

## 2024-06-15 ENCOUNTER — Inpatient Hospital Stay (HOSPITAL_COMMUNITY)

## 2024-06-15 ENCOUNTER — Observation Stay (HOSPITAL_COMMUNITY)

## 2024-06-15 DIAGNOSIS — O368130 Decreased fetal movements, third trimester, not applicable or unspecified: Secondary | ICD-10-CM

## 2024-06-15 DIAGNOSIS — E669 Obesity, unspecified: Secondary | ICD-10-CM

## 2024-06-15 DIAGNOSIS — R109 Unspecified abdominal pain: Secondary | ICD-10-CM | POA: Diagnosis not present

## 2024-06-15 DIAGNOSIS — O99212 Obesity complicating pregnancy, second trimester: Secondary | ICD-10-CM | POA: Diagnosis not present

## 2024-06-15 DIAGNOSIS — B348 Other viral infections of unspecified site: Secondary | ICD-10-CM | POA: Diagnosis present

## 2024-06-15 DIAGNOSIS — I5033 Acute on chronic diastolic (congestive) heart failure: Secondary | ICD-10-CM

## 2024-06-15 DIAGNOSIS — O26893 Other specified pregnancy related conditions, third trimester: Secondary | ICD-10-CM | POA: Diagnosis not present

## 2024-06-15 DIAGNOSIS — J9601 Acute respiratory failure with hypoxia: Secondary | ICD-10-CM

## 2024-06-15 DIAGNOSIS — Z3A28 28 weeks gestation of pregnancy: Secondary | ICD-10-CM

## 2024-06-15 DIAGNOSIS — R1013 Epigastric pain: Secondary | ICD-10-CM

## 2024-06-15 DIAGNOSIS — K469 Unspecified abdominal hernia without obstruction or gangrene: Secondary | ICD-10-CM | POA: Diagnosis not present

## 2024-06-15 LAB — RESPIRATORY PANEL BY PCR

## 2024-06-15 LAB — TYPE AND SCREEN
ABO/RH(D): A POS
Antibody Screen: NEGATIVE

## 2024-06-15 LAB — FIBRINOGEN: Fibrinogen: 632 mg/dL — ABNORMAL HIGH (ref 210–475)

## 2024-06-15 LAB — GLUCOSE, CAPILLARY
Glucose-Capillary: 89 mg/dL (ref 70–99)
Glucose-Capillary: 114 mg/dL — ABNORMAL HIGH (ref 70–99)
Glucose-Capillary: 182 mg/dL — ABNORMAL HIGH (ref 70–99)

## 2024-06-15 LAB — SARS CORONAVIRUS 2 BY RT PCR: SARS Coronavirus 2 by RT PCR: NEGATIVE

## 2024-06-15 LAB — AMYLASE: Amylase: 37 U/L (ref 28–100)

## 2024-06-15 LAB — LIPASE, BLOOD: Lipase: 18 U/L (ref 11–51)

## 2024-06-15 MED ORDER — DOCUSATE SODIUM 100 MG PO CAPS
100.0000 mg | ORAL_CAPSULE | Freq: Two times a day (BID) | ORAL | Status: DC
  Filled 2024-06-15: qty 1

## 2024-06-15 MED ORDER — ALBUTEROL SULFATE HFA 108 (90 BASE) MCG/ACT IN AERS: 1.0000 | INHALATION_SPRAY | Freq: Four times a day (QID) | RESPIRATORY_TRACT | Status: DC | PRN

## 2024-06-15 MED ORDER — ACETAMINOPHEN 325 MG PO TABS: 650.0000 mg | ORAL_TABLET | ORAL | Status: DC | PRN

## 2024-06-15 MED ORDER — CALCIUM CARBONATE ANTACID 500 MG PO CHEW: 2.0000 | CHEWABLE_TABLET | ORAL | Status: DC | PRN

## 2024-06-15 MED ORDER — LACTATED RINGERS IV SOLN: 125.0000 mL/h | INTRAVENOUS | Status: AC

## 2024-06-15 MED ORDER — FUROSEMIDE 10 MG/ML IJ SOLN
20.0000 mg | Freq: Once | INTRAMUSCULAR | Status: AC
  Administered 2024-06-15: 20 mg via INTRAVENOUS
  Filled 2024-06-15: qty 2

## 2024-06-15 MED ORDER — MORPHINE SULFATE (PF) 4 MG/ML IV SOLN
2.0000 mg | INTRAVENOUS | Status: DC | PRN
  Administered 2024-06-16 – 2024-06-17 (×7): 2 mg via INTRAVENOUS
  Filled 2024-06-15 (×8): qty 1

## 2024-06-15 MED ORDER — METHYLPREDNISOLONE SODIUM SUCC 125 MG IJ SOLR
125.0000 mg | Freq: Once | INTRAMUSCULAR | Status: AC
  Administered 2024-06-15: 125 mg via INTRAVENOUS
  Filled 2024-06-15: qty 2

## 2024-06-15 MED ORDER — FERROUS SULFATE 325 (65 FE) MG PO TABS
325.0000 mg | ORAL_TABLET | ORAL | Status: DC
  Administered 2024-06-15 – 2024-06-17 (×2): 325 mg via ORAL
  Filled 2024-06-15 (×3): qty 1

## 2024-06-15 MED ORDER — PREDNISONE 20 MG PO TABS
40.0000 mg | ORAL_TABLET | Freq: Every day | ORAL | Status: DC
  Administered 2024-06-16 – 2024-06-18 (×3): 40 mg via ORAL
  Filled 2024-06-15 (×3): qty 2

## 2024-06-15 MED ORDER — OXYCODONE-ACETAMINOPHEN 5-325 MG PO TABS
1.0000 | ORAL_TABLET | Freq: Once | ORAL | Status: DC
  Filled 2024-06-15: qty 1

## 2024-06-15 MED ORDER — LABETALOL HCL 100 MG PO TABS
200.0000 mg | ORAL_TABLET | Freq: Two times a day (BID) | ORAL | Status: DC
  Administered 2024-06-15 – 2024-06-18 (×6): 200 mg via ORAL
  Filled 2024-06-15 (×7): qty 2

## 2024-06-15 MED ORDER — IOHEXOL 350 MG/ML SOLN
100.0000 mL | Freq: Once | INTRAVENOUS | Status: AC | PRN
  Administered 2024-06-15: 100 mL via INTRAVENOUS

## 2024-06-15 MED ORDER — HYDROXYZINE HCL 25 MG PO TABS: 25.0000 mg | ORAL_TABLET | Freq: Three times a day (TID) | ORAL | Status: DC | PRN

## 2024-06-15 MED ORDER — INSULIN ASPART 100 UNIT/ML IJ SOLN
0.0000 [IU] | Freq: Three times a day (TID) | INTRAMUSCULAR | Status: DC
  Administered 2024-06-15: 1 [IU] via SUBCUTANEOUS
  Administered 2024-06-15: 3 [IU] via SUBCUTANEOUS
  Administered 2024-06-16 (×3): 2 [IU] via SUBCUTANEOUS
  Administered 2024-06-17: 3 [IU] via SUBCUTANEOUS
  Administered 2024-06-17: 1 [IU] via SUBCUTANEOUS
  Administered 2024-06-17: 2 [IU] via SUBCUTANEOUS
  Administered 2024-06-18: 1 [IU] via SUBCUTANEOUS
  Administered 2024-06-18: 3 [IU] via SUBCUTANEOUS
  Filled 2024-06-15: qty 1
  Filled 2024-06-15 (×3): qty 2
  Filled 2024-06-15 (×2): qty 3
  Filled 2024-06-15: qty 1
  Filled 2024-06-15: qty 2
  Filled 2024-06-15: qty 3
  Filled 2024-06-15 (×2): qty 1

## 2024-06-15 MED ORDER — IPRATROPIUM-ALBUTEROL 0.5-2.5 (3) MG/3ML IN SOLN
3.0000 mL | RESPIRATORY_TRACT | Status: DC | PRN
  Administered 2024-06-16 – 2024-06-18 (×2): 3 mL via RESPIRATORY_TRACT
  Filled 2024-06-15 (×2): qty 3

## 2024-06-15 MED ORDER — ONDANSETRON 4 MG PO TBDP
4.0000 mg | ORAL_TABLET | Freq: Four times a day (QID) | ORAL | Status: DC | PRN
  Administered 2024-06-16 – 2024-06-17 (×2): 4 mg via ORAL
  Filled 2024-06-15 (×2): qty 1

## 2024-06-15 MED ORDER — FLUTICASONE FUROATE-VILANTEROL 200-25 MCG/ACT IN AEPB
1.0000 | INHALATION_SPRAY | Freq: Every day | RESPIRATORY_TRACT | Status: DC
  Administered 2024-06-15 – 2024-06-18 (×4): 1 via RESPIRATORY_TRACT
  Filled 2024-06-15: qty 28

## 2024-06-15 MED ORDER — IPRATROPIUM-ALBUTEROL 0.5-2.5 (3) MG/3ML IN SOLN
3.0000 mL | Freq: Four times a day (QID) | RESPIRATORY_TRACT | Status: DC
  Administered 2024-06-15 – 2024-06-18 (×10): 3 mL via RESPIRATORY_TRACT
  Filled 2024-06-15 (×22): qty 3

## 2024-06-15 MED ORDER — FUROSEMIDE 10 MG/ML IJ SOLN: 40.0000 mg | Freq: Once | INTRAMUSCULAR | Status: DC

## 2024-06-15 MED ORDER — OXYCODONE-ACETAMINOPHEN 5-325 MG PO TABS: 1.0000 | ORAL_TABLET | Freq: Four times a day (QID) | ORAL | Status: DC | PRN

## 2024-06-15 MED ORDER — LORAZEPAM 1 MG PO TABS
1.0000 mg | ORAL_TABLET | Freq: Once | ORAL | Status: AC
  Administered 2024-06-15: 1 mg via ORAL
  Filled 2024-06-15: qty 1

## 2024-06-15 MED ORDER — HYDROMORPHONE HCL 1 MG/ML IJ SOLN
2.0000 mg | Freq: Once | INTRAMUSCULAR | Status: AC
  Administered 2024-06-15: 2 mg via INTRAVENOUS
  Filled 2024-06-15: qty 2

## 2024-06-15 MED ORDER — ASPIRIN 81 MG PO CHEW
162.0000 mg | CHEWABLE_TABLET | Freq: Every day | ORAL | Status: DC
  Administered 2024-06-15 – 2024-06-18 (×4): 162 mg via ORAL
  Filled 2024-06-15 (×4): qty 2

## 2024-06-15 MED ORDER — ONDANSETRON 4 MG PO TBDP
4.0000 mg | ORAL_TABLET | Freq: Once | ORAL | Status: AC
  Administered 2024-06-15: 4 mg via ORAL
  Filled 2024-06-15: qty 1

## 2024-06-15 MED ORDER — LORAZEPAM 2 MG/ML IJ SOLN
2.0000 mg | Freq: Once | INTRAMUSCULAR | Status: AC
  Administered 2024-06-15: 2 mg via INTRAVENOUS
  Filled 2024-06-15: qty 1

## 2024-06-15 MED ORDER — IPRATROPIUM-ALBUTEROL 0.5-2.5 (3) MG/3ML IN SOLN
3.0000 mL | Freq: Four times a day (QID) | RESPIRATORY_TRACT | Status: DC | PRN
  Administered 2024-06-15: 3 mL via RESPIRATORY_TRACT
  Filled 2024-06-15 (×2): qty 3

## 2024-06-15 MED ORDER — OXYCODONE-ACETAMINOPHEN 5-325 MG PO TABS
1.0000 | ORAL_TABLET | Freq: Four times a day (QID) | ORAL | Status: DC | PRN
  Administered 2024-06-15 – 2024-06-17 (×8): 2 via ORAL
  Administered 2024-06-17: 1 via ORAL
  Administered 2024-06-17 – 2024-06-18 (×4): 2 via ORAL
  Filled 2024-06-15 (×14): qty 2

## 2024-06-15 MED ORDER — ONDANSETRON 4 MG PO TBDP
4.0000 mg | ORAL_TABLET | Freq: Once | ORAL | Status: DC
  Filled 2024-06-15: qty 1

## 2024-06-15 MED ORDER — PRENATAL MULTIVITAMIN CH
1.0000 | ORAL_TABLET | Freq: Every day | ORAL | Status: DC
  Administered 2024-06-15 – 2024-06-18 (×4): 1 via ORAL
  Filled 2024-06-15 (×4): qty 1

## 2024-06-15 MED ORDER — FUROSEMIDE 40 MG PO TABS
40.0000 mg | ORAL_TABLET | Freq: Two times a day (BID) | ORAL | Status: DC
  Administered 2024-06-15 – 2024-06-17 (×5): 40 mg via ORAL
  Filled 2024-06-15 (×5): qty 1

## 2024-06-15 MED ORDER — ENOXAPARIN SODIUM 100 MG/ML IJ SOSY
100.0000 mg | PREFILLED_SYRINGE | Freq: Two times a day (BID) | INTRAMUSCULAR | Status: DC
  Administered 2024-06-15 – 2024-06-18 (×7): 100 mg via SUBCUTANEOUS
  Filled 2024-06-15 (×7): qty 1

## 2024-06-15 MED ORDER — GUAIFENESIN ER 600 MG PO TB12
600.0000 mg | ORAL_TABLET | Freq: Two times a day (BID) | ORAL | Status: DC
  Administered 2024-06-15 – 2024-06-18 (×6): 600 mg via ORAL
  Filled 2024-06-15 (×8): qty 1

## 2024-06-15 NOTE — Significant Event (Addendum)
 Rapid Response Event Note   Reason for Call :  Pt with increased SOB and wheezing. spO2 89% on 2L- RN instructed to increase O2 for sats 93%>  Initial Focused Assessment:  On arrival, pt sitting in bed. A&O, able to follow commands. Lung sounds diminished with wheezing noted in upper lobes. Pt appears slightly anxious. She c/o pain to RUQ from fall prior to admission. She has a wet congested cough.   VS: HR 80, BP 92/55, spO2 100% on 4L [place], RR 24.   While in room, pt was able to get up and ambulate to bathroom with no change in respiratory status noted.   BP rechecked after using restroom- 138/75  Interventions:  Primary Md notified and to bedside CT chest/abd/pelvis ordered PTA Ativan  ordered PTA Pt had just received 40mg  PO Lasix  Duoneb- PTA TRH consulted and to bedside Additional IV Lasix  20mg  Solumedrol Resp PCR  Plan of Care:  Continue to monitor pt respiratory status. Educated pt on importance of obtaining CT scans. Pt agreeable to get scans today with medication to help relax her (Ativan  already ordered).   RN instructed to call with any changes or concerns. RRT will continue to follow and monitor as needed.    Event Summary:   MD Notified: PTA Call Time: 1020 Arrival Time: 1040 End Time: 1112  Nat Blunt, RN

## 2024-06-15 NOTE — Progress Notes (Signed)
 When I arrived in pt's room the DUONEB treatment was underway.  Pt. Has increased work of breathing.  Inspiratory and expiratory wheezing bilat. Audible wheezing also noted along with strong non productive cough.  SpO2 reading 100%. RN to give Breo Ellipta  following nebulizer treatment.  P.Lawson,RN at bedside and notified me that pt. Has a CT scan scheduled for today.

## 2024-06-15 NOTE — Progress Notes (Signed)
 FACULTY PRACTICE ANTEPARTUM PROGRESS NOTE  Brooke Sandoval is a 38 y.o. [phone] at [redacted]w[redacted]d who is admitted for abdominal pain, dyspnea and decreased fetal movement.  Estimated Date of Delivery: 09/06/24 Fetal presentation is unsure.  Length of Stay:  0 Days. Admitted 06/14/2024  Subjective: CTSP during rounding due to worsening respiratory status.  Pt has been seen by respiratory team once already this AM to receive breathing treatments.  Pt still has dyspnea.  Pt is currently on 4 liters of oxygen. Patient reports decreased fetal movement.  BPP today is pending. She denies uterine contractions, denies bleeding and leaking of fluid per vagina.  Vitals:  Blood pressure 118/88, pulse 84, temperature (!) 97.5 F (36.4 C), temperature source Oral, resp. rate (!) 24, height 5' 8 (1.727 m), weight (!) 185.5 kg, SpO2 96%. Physical Examination: CONSTITUTIONAL: Well-developed, obese,well-nourished female in moderate acute distress.  HENT:  Normocephalic, atraumatic, External right and left ear normal. Oropharynx is clear and moist EYES: Conjunctivae and EOM are normal.  NECK: Normal range of motion, supple, no masses. SKIN: Skin is warm and dry. No rash noted. Not diaphoretic. No erythema. No pallor. NEUROLGIC: Alert and oriented to person, place, and time. Normal reflexes, muscle tone coordination. No cranial nerve deficit noted. PSYCHIATRIC: Normal mood and affect. Normal behavior. Normal judgment and thought content. CARDIOVASCULAR: Normal heart rate noted, regular rhythm RESPIRATORY: increased respiratory effort noted.  Inspiratory and respiratory wheezes noted. MUSCULOSKELETAL: Normal range of motion. No edema and no tenderness. ABDOMEN: Soft, mildly tender, nondistended, gravid. Large hernia noted CERVIX: deferred  Fetal monitoring: FHR: 120 bpm, Variability: moderate, Accelerations: Present, Decelerations: Absent  Uterine activity: none  Results for orders placed or performed during the  hospital encounter of 06/14/24 (from the past 48 hours)  Protein / creatinine ratio, urine     Status: None   Collection Time: 06/14/24  8:11 PM  Result Value Ref Range   Creatinine, Urine 165 mg/dL    Comment: NO NORMAL RANGE ESTABLISHED FOR THIS TEST   Total Protein, Urine 23 mg/dL    Comment: NO NORMAL RANGE ESTABLISHED FOR THIS TEST   Protein Creatinine Ratio 0.1 <0.2 mg/mg    Comment: Please note change in reference range. Performed at [facility], [address], [place], [place] [address]   Urinalysis, Routine w reflex microscopic -Urine, Clean Catch     Status: Abnormal   Collection Time: 06/14/24  8:11 PM  Result Value Ref Range   Color, Urine YELLOW YELLOW   APPearance CLEAR CLEAR   Specific Gravity, Urine 1.020 1.005 - 1.030   pH 7.0 5.0 - 8.0   Glucose, UA NEGATIVE NEGATIVE mg/dL   Hgb urine dipstick NEGATIVE NEGATIVE   Bilirubin Urine NEGATIVE NEGATIVE   Ketones, ur NEGATIVE NEGATIVE mg/dL   Protein, ur NEGATIVE NEGATIVE mg/dL   Nitrite NEGATIVE NEGATIVE   Leukocytes,Ua NEGATIVE NEGATIVE    Comment: Microscopic not done on urines with negative protein, blood, leukocytes, nitrite, or glucose < 500 mg/dL.   RBC / HPF 0-5 0 - 5 RBC/hpf   WBC, UA 0-5 0 - 5 WBC/hpf   Bacteria, UA RARE (A) NONE SEEN   Squamous Epithelial / HPF 6-10 0 - 5 /HPF   Mucus PRESENT     Comment: Performed at [facility], [address] [place], [place] [address]  CBC     Status: Abnormal   Collection Time: 06/14/24  8:30 PM  Result Value Ref Range   WBC 11.7 (H) 4.0 - 10.5  K/uL   RBC 3.99 3.87 - 5.11 MIL/uL   Hemoglobin 10.8 (L) 12.0 - 15.0 g/dL   HCT 66.3 (L) 63.9 - 53.9 %   MCV 84.2 80.0 - 100.0 fL   MCH 27.1 26.0 - 34.0 pg   MCHC 32.1 30.0 - 36.0 g/dL   RDW 82.5 (H) 88.4 - 84.4 %   Platelets 227 150 - 400 K/uL   nRBC 0.0 0.0 - 0.2 %    Comment: Performed at [facility], [address], [place], [place] [address]  Comprehensive metabolic panel with GFR     Status:  Abnormal   Collection Time: 06/14/24  8:30 PM  Result Value Ref Range   Sodium 135 135 - 145 mmol/L   Potassium 4.8 3.5 - 5.1 mmol/L   Chloride 102 98 - 111 mmol/L   CO2 25 22 - 32 mmol/L   Glucose, Bld 100 (H) 70 - 99 mg/dL    Comment: Glucose reference range applies only to samples taken after fasting for at least 8 hours.   BUN 8 6 - 20 mg/dL   Creatinine, Ser 9.42 0.44 - 1.00 mg/dL   Calcium  9.1 8.9 - 10.3 mg/dL   Total Protein 6.2 (L) 6.5 - 8.1 g/dL   Albumin 2.9 (L) 3.5 - 5.0 g/dL   AST 17 15 - 41 U/L   ALT 9 0 - 44 U/L   Alkaline Phosphatase 82 38 - 126 U/L   Total Bilirubin 0.2 0.0 - 1.2 mg/dL   GFR, Estimated >39 >39 mL/min    Comment: (NOTE) Calculated using the CKD-EPI Creatinine Equation (2021)    Anion gap 8 5 - 15    Comment: Performed at [facility], [address], [place], [place] [address]  Pro Brain natriuretic peptide     Status: None   Collection Time: 06/14/24  8:30 PM  Result Value Ref Range   Pro Brain Natriuretic Peptide <50.0 <300.0 pg/mL    Comment: (NOTE) Age Group        Cut-Points    Interpretation  < 50 years     450 pg/mL       NT-proBNP > 450 pg/mL indicates                                ADHF is likely              50 to 75 years  900 pg/mL      NT-proBNP > 900 pg/mL indicates          ADHF is likely  > 75 years      1800 pg/mL     NT-proBNP > 1800 pg/mL indicates          ADHF is likely                           All ages    Results between       Indeterminate. Further clinical             300 and the cut-   information is needed to determine            point for age group   if ADHF is present.  Elecsys proBNP II/ Elecsys proBNP II STAT           Cut-Point                       Interpretation  300 pg/mL                    NT-proBNP <300pg/mL indicates                             ADHF is not likely  Performed at [facility], [address] [place],  [place] [address]   Troponin T, High Sensitivity     Status: None   Collection Time: 06/14/24  9:29 PM  Result Value Ref Range   Troponin T High Sensitivity <15 0 - 19 ng/L    Comment: (NOTE) Biotin concentrations > 1000 ng/mL falsely decrease TnT results.  Serial cardiac troponin measurements are suggested.  Refer to the Links section for chest pain algorithms and additional  guidance. Performed at [facility], [address] [place], [place] [address]   Type and screen [facility]     Status: None   Collection Time: 06/15/24  7:13 AM  Result Value Ref Range   ABO/RH(D) A POS    Antibody Screen NEG    Sample Expiration      06/18/2024,2359 Performed at [facility], [address], [place], [place] [address]   Lipase, blood     Status: None   Collection Time: 06/15/24  7:18 AM  Result Value Ref Range   Lipase 18 11 - 51 U/L    Comment: Performed at [facility], [address], [place], [place] [address]  Amylase     Status: None   Collection Time: 06/15/24  7:18 AM  Result Value Ref Range   Amylase 37 28 - 100 U/L    Comment: Performed at [facility], [address], [place], [place] [address]  Fibrinogen      Status: Abnormal   Collection Time: 06/15/24  7:18 AM  Result Value Ref Range   Fibrinogen  632 (H) 210 - 475 mg/dL    Comment: (NOTE) Fibrinogen  results may be underestimated in patients receiving thrombolytic therapy. Performed at [facility], [address] [place], [place] [address]     I have reviewed the patient's current medications.  ASSESSMENT: Principal Problem:   Abdominal pain Acute dyspnea  PLAN: Rapid response nurse called to reassess.  Hospitalist called to assist with recommendations as well.  CT PE protocol ordered to rule out PE. Abdominal pain: abdominal CT scheduled to eval hernia Decreased fetal movement: BPP today performed results pending If breathing stabilized and fetal testing is  adequate, anticipate d/c on 06/16/24   Continue routine antenatal care.   Jerilynn Buddle, MD [facility] Faculty Attending, [facility] 06/15/2024 10:26 AM

## 2024-06-15 NOTE — Consult Note (Signed)
 Initial Consultation Note   Patient: Brooke Sandoval FMW:[id number] DOB: 1985-07-21 PCP: Patient, No Pcp Per DOA: 06/14/2024 DOS: the patient was seen and examined on 06/15/2024 Primary service: Zina Jerilynn LABOR, MD  Referring physician: Dr. Zina Reason for consult: Shortness of breath   Assessment and Plan:  Intrauterine pregnancy - Per OB/GYN  Acute respiratory failure with hypoxia Diastolic congestive heart failure exacerbation and/or asthma exacerbation versus pulmonary embolism versus other Patient presented with worsening shortness of breath and wheezing.  Reports shortness of breath worsened when laying flat for which she feels like she may be drowning at times.  proBNP low, but patient is morbidly obese for which levels can falsely be low.  Chest x-ray appears to show more so peribronchial cuffing for which would suspect patient has edema present.  Reports compliance with Lasix .  At this time suspect symptoms may be multifactorial.  Also questioning if patient's abdominal hernia is factoring - Continue nasal cannula oxygen and wean to room air when stable - Follow-up CT angiogram of the chest rule out pulmonary embolism and CT of the abdomen pelvis - Check complete respiratory virus panel (addendum: Positive for rhinovirus/enterovirus) - Will give Lasix  20 mg IV x 1 dose, then continue current regimen of 40 mg twice daily - Give Solu-Medrol  125 mg IV x 1 dose, and then start prednisone  40 mg p.o. to be weaned - DuoNebs 4 times daily and as needed - Mucinex   History of DVT/pulmonary embolism Protein S deficiency - Follow-up CT angiogram of the chest - Continue  Lovenox    Hiatal hernia - Follow-up CT of the abdomen pelvis  TRH will continue to follow the patient.  HPI: Brooke Sandoval is a 38 y.o. female with past medical history of [id number] asthma, diastolic CHF, protein S deficiency, DVT/pulmonary embolism, anxiety, and morbid obesity  presents with shortness of breath, upper  abdominal pain, and right arm cramping after having a fall yesterday.  She experiences persistent wheezing and coughing. The patient reports difficulty breathing, especially when lying down or trying to sleep. She has been taking Lasix  as prescribed and denies missing any doses recently. She describes a sensation of 'drowning' when drinking.  She recalls a previous experience during pregnancy where she was on bedrest and had similar breathing difficulties, requiring oxygen. She also mentions a hernia that she feels is affecting her breathing.  She fell yesterday, injuring her shoulder, which has added to her discomfort. She notes that her voice has changed since yesterday, becoming hoarse.  Review of records note patient was positive for adenovirus when hospitalized for similar symptoms during hospitalization on 12/3 symptoms improved with.  Labs obtained yesterday noted WBC 11.7, hemoglobin 10.8, proBNP less than 50, and high-sensitivity troponin 15.  Chest x-ray noted bronchial vascular crowding versus mild pulmonary edema.  Currently on therapeutic Lovenox . Review of Systems: As mentioned in the history of present illness. All other systems reviewed and are negative. Past Medical History:  Diagnosis Date   ADHD (attention deficit hyperactivity disorder)    Anxiety    Attention deficit hyperactivity disorder (ADHD), 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  Prescribed by [facility] in [place].  She DC'd with + UPT but would like to restart.  Counseled not recommended in pregnancy.  Counseled to establish with [facility] ASAP as this clinic will not prescribe those types of meds.  Verbal and written information provided today.  Will discuss with M[facility] at [facility] .     If symptoms not well controlled with  counseling/behavioral therapy, re   CHF (congestive heart failure) (HCC)    after admission for vetnal hernia   Coagulation factor disorder    DVT (deep venous thrombosis) (HCC)    History of  chlamydia infection 09/12/2014   3/19 Called in Azithromycin Rx for positive chlamydia.   4/28:  TOC NEG         02/25/2015  TOC neg 8/10     History of gestational diabetes    History of pre-eclampsia    Miscarriage    Poor dentition 10/10/2014   Records note poor dentition in prior records. Please ensure she is getting regular dental care.     Poor intravenous access 12/09/2014   In last pregnancy, required port access.     Past Surgical History:  Procedure Laterality Date   APPENDECTOMY     CESAREAN SECTION     TONSILLECTOMY     VENTRAL HERNIA REPAIR     Social History:  reports that she has never smoked. She has never been exposed to tobacco smoke. She has never used smokeless tobacco. She reports that she does not currently use alcohol. She reports that she does not use drugs.  Allergies[1]  Family History  Problem Relation Age of Onset   Depression Mother    COPD Mother    Diabetes Mother    Schizophrenia Father    Drug abuse Sister     Prior to Admission medications  Medication Sig Start Date End Date Taking? Authorizing Provider  albuterol  (VENTOLIN  HFA) 108 (90 Base) MCG/ACT inhaler Inhale 1-2 puffs into the lungs every 6 (six) hours as needed for wheezing or shortness of breath. 06/13/24  Yes Lola Donnice HERO, MD  aspirin  (ASPIRIN  CHILDRENS) 81 MG chewable tablet Chew 2 tablets (162 mg total) by mouth daily. 06/13/24 06/13/25 Yes Lola Donnice HERO, MD  budesonide -formoterol  (SYMBICORT ) 160-4.5 MCG/ACT inhaler Inhale 2 puffs into the lungs 2 (two) times daily. 06/13/24  Yes Lola Donnice HERO, MD  docusate sodium  (COLACE) 100 MG capsule Take 1 capsule (100 mg total) by mouth 2 (two) times daily. 01/22/24 01/21/25 Yes Lorrane Pac, MD  enoxaparin  (LOVENOX ) 100 MG/ML injection Inject 1 mL (100 mg total) into the skin every 12 (twelve) hours. 06/13/24  Yes Lola Donnice HERO, MD  furosemide  (LASIX ) 40 MG tablet Take 1 tablet (40 mg total) by mouth 2 (two) times daily.  06/13/24  Yes Lola Donnice HERO, MD  hydrOXYzine  (ATARAX ) 25 MG tablet Take 1 tablet (25 mg total) by mouth 3 (three) times daily as needed for anxiety. 06/13/24  Yes Lola Donnice HERO, MD  ipratropium-albuterol  (DUONEB) 0.5-2.5 (3) MG/3ML SOLN Take 3 mLs by nebulization every 6 (six) hours as needed. 06/13/24  Yes Lola Donnice HERO, MD  Iron , Ferrous Sulfate , 325 (65 Fe) MG TABS Take 1 tablet by mouth every other day. 06/13/24 08/13/24 Yes Lola Donnice HERO, MD  labetalol  (NORMODYNE ) 200 MG tablet Take 1 tablet (200 mg total) by mouth 2 (two) times daily. 06/13/24  Yes Lola Donnice HERO, MD  ondansetron  (ZOFRAN -ODT) 4 MG disintegrating tablet Take 1 tablet (4 mg total) by mouth every 8 (eight) hours as needed for nausea vomitting 06/13/24  Yes Lola Donnice HERO, MD  Prenatal Vit-Fe Fumarate-FA (MULTIVITAMIN-PRENATAL) 27-0.8 MG TABS tablet Take 1 tablet by mouth daily at 12 noon.   Yes [provider]  Blood Pressure Monitoring DEVI 1 each by Does not apply route once a week. Patient not taking: Reported on 05/15/2024 03/20/24   Zina Jerilynn LABOR, MD  oxyCODONE  (OXY IR/ROXICODONE ) 5 MG immediate release tablet Take 1 tablet (5 mg total) by mouth every 4 (four) hours as needed for severe pain (pain score 7-10) or breakthrough pain. 05/29/24   Zina Jerilynn LABOR, MD    Physical Exam: Vitals:   06/15/24 1003 06/15/24 1010 06/15/24 1020 06/15/24 1042  BP: (!) 92/55     Pulse: 88     Resp:      Temp:      TempSrc:      SpO2:  (!) 89% 98% 92%  Weight:      Height:         Constitutional: Morbidly obese female who appears to be in some distress Eyes: PERRL, lids and conjunctivae normal ENMT: Mucous membranes are moist. Posterior pharynx clear of any exudate or lesions.Normal dentition.  Neck: normal, supple, no masses, no thyromegaly Respiratory: Tachypneic with decreased aeration with expiratory wheezes present.  Patient currently on 4 L nasal cannula oxygen to maintain O2  saturations.  Able to speak in short sentences. Cardiovascular: Regular rate and rhythm, no murmurs / rubs / gallops.  At least +1 pitting edema.  2+ pedal pulses.   Abdomen: Gravid abdomen with tenderness palpation epigastrically. Musculoskeletal: no clubbing / cyanosis. No joint deformity upper and lower extremities. Good ROM, no contractures. Normal muscle tone.  Skin: no rashes, lesions, ulcers. No induration Neurologic: CN 2-12 grossly intact. Strength 5/5 in all 4.  Psychiatric: Normal judgment and insight. Alert and oriented x 3. Normal mood.   Data Reviewed:   Reviewed labs, imaging, and pertinent records as documented   Family Communication:   Primary team communication:   Thank you very much for involving us  in the care of your patient.  Author: Maximino LABOR Sharps, MD 06/15/2024 11:12 AM  For on call review [email or web address].      [1]  Allergies Allergen Reactions   Prednisone  Swelling

## 2024-06-15 NOTE — Progress Notes (Signed)
 Patient sitting on the side of bed, just finished her breakfast, patient c/o increased SOB. Respiratory RT called to inform pt increased SOB.

## 2024-06-15 NOTE — Progress Notes (Signed)
 IV team received consult on behalf of pt. Pt had 20g 2.5 catheter placed last night (2118 12/20) by IV team. Per primary RN, IV flushes well and pt has no complaints. This RN called CT and spoke with tech about plan of care. CT tech stated that they usually check for blood return on PIVs to ensure patency. This RN stated that PIVs do not always have good blood return s/p insertion and flushing. Stated that if pt has no other symptoms (ie swelling at site, redness, burning, pain, etc.) then IV should be okay to continue to use. CT tech stated they would power flush it and double check with pt before injecting contrast. This RN stated to both CT tech & primary RN that if anyone feels uncomfortable about using current IV, then IV team could come and assess pt for alternate site when able. Encouraged to reach back out with any concerns or changes to plan of care.

## 2024-06-15 NOTE — MAU Note (Signed)
 Pt had an order to go to CT. Pt had voiced that she had some anxiety and felt uncomfortable. RN and CNM explained Procedure to Patient and patient understood. Pt was given medication before procedure to help calm patient. Once patient was in CT, pt was not able to go through the CT procedure.   [name], [place] Provider and Abigail, MD aware of situation.

## 2024-06-15 NOTE — Progress Notes (Signed)
 Patient with SOB, NICU RT called for neb treatment.

## 2024-06-15 NOTE — Progress Notes (Signed)
 Patient with dyspnea/SOB o2 sats to 77% RA, o2 [place] started at 4 LPM while calling Dr. Zina to update episode and that NICU RT was on the way.

## 2024-06-15 NOTE — Progress Notes (Signed)
 Orthopedic Tech Progress Note Patient Details:  Brooke Sandoval 09/27/1985 [id number]  Ortho Devices Type of Ortho Device: Arm sling Ortho Device/Splint Location: rue Ortho Device/Splint Interventions: Ordered, Application, Adjustment   Post Interventions Patient Tolerated: Well Instructions Provided: Care of device, Adjustment of device  Chandra Dorn PARAS 06/15/2024, 12:48 AM

## 2024-06-15 NOTE — H&P (Signed)
 FACULTY PRACTICE ANTEPARTUM ADMISSION HISTORY AND PHYSICAL NOTE   History of Present Illness: Brooke Sandoval is a 38 y.o. [id number] at [redacted]w[redacted]d admitted for abdominal pain in pregnancy.  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  She presented to maternity admissions reporting shortness of breath, upper abdominal pain, dizziness, and pain in her right arm. She fell yesterday and hit her knee into her abdomen and hit her left arm.  NST was normal in the office after the fall but the pain started in her abdomen and arm today.  While in MAU, she reported decreased fetal movement as well. She abdominal imaging was recommended as well as for r/o PE. She was unable to tolerate CT imaging. Respiratory symptoms were improved after breathing treatment. BPP was 6/8.    Patient Active Problem List   Diagnosis Date Noted   Abdominal pain 06/15/2024   Unwanted fertility 06/13/2024   Irreducible Spigelian hernia 05/28/2024   Pelvic adhesions 05/28/2024   Acute congestive heart failure (HCC) 05/27/2024   Hepatic steatosis during pregnancy 03/28/2024   Low fetal fraction on NIPS 03/20/2024   Advanced maternal age in multigravida 03/20/2024   RED chart patient 02/21/2024   At risk for injury related to surgical procedure, history of bowel injury 02/21/2024   Placenta positioned low in anterior wall of uterus 02/21/2024   Supervision of high risk pregnancy, antepartum 02/06/2024   History of congestive heart failure 01/22/2024   history of 5 c sections 12/27/2021   Ventral hernia without obstruction or gangrene 12/27/2021   History of substance abuse (HCC) 03/03/2015   History of gestational diabetes 02/16/2015   Asthma affecting pregnancy, antepartum 12/09/2014   Chronic hypertension during pregnancy, antepartum 12/09/2014   Anxiety 10/08/2014   Previous cesarean delivery affecting pregnancy, antepartum  10/08/2014   Hx of pulmonary embolus during pregnancy 10/08/2014   Protein S deficiency affecting pregnancy, antepartum 10/08/2014   BMI 50.0-59.9, adult (HCC) 08/28/2014   History of pre-eclampsia in prior pregnancy, currently pregnant in first trimester 08/28/2014    Past Medical History:  Diagnosis Date   ADHD (attention deficit hyperactivity disorder)    Anxiety    Attention deficit hyperactivity disorder (ADHD), 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  Prescribed by [facility] in [place].  She DC'd with + UPT but would like to restart.  Counseled not recommended in pregnancy.  Counseled to establish with [facility] ASAP as this clinic will not prescribe those types of meds.  Verbal and written information provided today.  Will discuss with MFM at [facility] .     If symptoms not well controlled with counseling/behavioral therapy, re   CHF (congestive heart failure) (HCC)    after admission for vetnal hernia   Coagulation factor disorder    DVT (deep venous thrombosis) (HCC)    History of chlamydia infection 09/12/2014   3/19 Called in Azithromycin Rx for positive chlamydia.   4/28:  TOC NEG         02/25/2015  TOC neg 8/10     History of gestational diabetes    History of pre-eclampsia    Miscarriage    Poor dentition 10/10/2014   Records note poor dentition in prior records. Please ensure she is getting regular dental care.     Poor intravenous access 12/09/2014   In last pregnancy, required port access.      Past Surgical History:  Procedure Laterality  Date   APPENDECTOMY     CESAREAN SECTION     TONSILLECTOMY     VENTRAL HERNIA REPAIR      OB History  Gravida Para Term Preterm AB Living  9 7  7 1 5   SAB IAB Ectopic Multiple Live Births  1    5    # Outcome Date GA Lbr Len/2nd Weight Sex Type Anes PTL Lv  9 Current           8 Preterm 04/06/15 [redacted]w[redacted]d   M CS-LTranv   LIV  7 SAB 2014 [redacted]w[redacted]d         6 Preterm 04/14/11 [redacted]w[redacted]d    CS-LTranv   LIV  5 Preterm 02/2010 [redacted]w[redacted]d    M Vag-Spont   FD  4 Preterm 09/23/07 [redacted]w[redacted]d   F CS-LTranv   LIV  3 Preterm 07/26/06 [redacted]w[redacted]d   M CS-LTranv   LIV  2 Preterm 2007 [redacted]w[redacted]d    Vag-Spont   FD     Complications: Clotting disorder  1 Preterm 09/15/03 [redacted]w[redacted]d   M CS-LTranv   LIV    Obstetric Comments  Hx GDM, pre-E, clotting disorder, two fetal demise due to blood clot in placenta per pt.  Past deliveries at [facility] and in [place] .    Social History   Socioeconomic History   Marital status: Single    Spouse name: Not on file   Number of children: Not on file   Years of education: Not on file   Highest education level: Not on file  Occupational History   Not on file  Tobacco Use   Smoking status: Never    Passive exposure: Never   Smokeless tobacco: Never  Vaping Use   Vaping status: Never Used  Substance and Sexual Activity   Alcohol use: Not Currently    Comment: socially before pregnancy   Drug use: No   Sexual activity: Not Currently    Comment: 1 mon ago  Other Topics Concern   Not on file  Social History Narrative   Not on file   Social Drivers of Health   Tobacco Use: Low Risk (06/14/2024)   Patient History    Smoking Tobacco Use: Never    Smokeless Tobacco Use: Never    Passive Exposure: Never  Financial Resource Strain: Not on file  Food Insecurity: No Food Insecurity (05/27/2024)   Epic    Worried About Programme Researcher, Broadcasting/film/video in the Last Year: Never true    Ran Out of Food in the Last Year: Never true  Transportation Needs: Unmet Transportation Needs (05/27/2024)   Epic    Lack of Transportation (Medical): Yes    Lack of Transportation (Non-Medical): Yes  Physical Activity: Not on file  Stress: Not on file  Social Connections: Not on file  Depression (PHQ2-9): Not on file  Alcohol Screen: Not on file  Housing: High Risk (05/27/2024)   Epic    Unable to Pay for Housing in the Last Year: Yes    Number of Times Moved in the Last Year: 1    Homeless in the Last Year: No  Utilities: At Risk  (05/27/2024)   Epic    Threatened with loss of utilities: Yes  Health Literacy: Not on file    Family History  Problem Relation Age of Onset   Depression Mother    COPD Mother    Diabetes Mother    Schizophrenia Father    Drug abuse Sister     Allergies[1]  Medications  Prior to Admission  Medication Sig Dispense Refill Last Dose/Taking   albuterol  (VENTOLIN  HFA) 108 (90 Base) MCG/ACT inhaler Inhale 1-2 puffs into the lungs every 6 (six) hours as needed for wheezing or shortness of breath. 6.7 g 1 Past Month   aspirin  (ASPIRIN  CHILDRENS) 81 MG chewable tablet Chew 2 tablets (162 mg total) by mouth daily. 60 tablet 11 Past Week   budesonide -formoterol  (SYMBICORT ) 160-4.5 MCG/ACT inhaler Inhale 2 puffs into the lungs 2 (two) times daily. 10.2 g 12 06/14/2024   docusate sodium  (COLACE) 100 MG capsule Take 1 capsule (100 mg total) by mouth 2 (two) times daily. 60 capsule 2 06/13/2024   enoxaparin  (LOVENOX ) 100 MG/ML injection Inject 1 mL (100 mg total) into the skin every 12 (twelve) hours. 60 mL 11 06/14/2024 at  2:00 PM   furosemide  (LASIX ) 40 MG tablet Take 1 tablet (40 mg total) by mouth 2 (two) times daily. 60 tablet 5 06/14/2024   hydrOXYzine  (ATARAX ) 25 MG tablet Take 1 tablet (25 mg total) by mouth 3 (three) times daily as needed for anxiety. 30 tablet 0 Past Week   ipratropium-albuterol  (DUONEB) 0.5-2.5 (3) MG/3ML SOLN Take 3 mLs by nebulization every 6 (six) hours as needed. 360 mL 0 06/14/2024 at  2:00 PM   Iron , Ferrous Sulfate , 325 (65 Fe) MG TABS Take 1 tablet by mouth every other day. 30 tablet 3 Past Week   labetalol  (NORMODYNE ) 200 MG tablet Take 1 tablet (200 mg total) by mouth 2 (two) times daily. 60 tablet 0 06/14/2024 at  4:30 PM   ondansetron  (ZOFRAN -ODT) 4 MG disintegrating tablet Take 1 tablet (4 mg total) by mouth every 8 (eight) hours as needed for nausea vomitting 90 tablet 0 Past Week   Prenatal Vit-Fe Fumarate-FA (MULTIVITAMIN-PRENATAL) 27-0.8 MG TABS tablet Take  1 tablet by mouth daily at 12 noon.   Past Week   Blood Pressure Monitoring DEVI 1 each by Does not apply route once a week. (Patient not taking: Reported on 05/15/2024) 1 each 0    oxyCODONE  (OXY IR/ROXICODONE ) 5 MG immediate release tablet Take 1 tablet (5 mg total) by mouth every 4 (four) hours as needed for severe pain (pain score 7-10) or breakthrough pain. 10 tablet 0     Review of Systems - per HPI  Vitals:  BP (!) 112/54 (BP Location: Left Arm)   Pulse 83   Temp 97.9 F (36.6 C) (Oral)   Resp 18   Ht 5' 8 (1.727 m)   Wt (!) 185.5 kg   LMP  (LMP Unknown)   SpO2 100%   BMI 62.19 kg/m  Physical Examination: Gen: appears well, no acute distress Abd: soft, gravid, nontender throughout with exception of RUQ with mild tenderness with deep palpation, no rebound/guarding, significant central obesity  CNM exam in MAU:  Constitutional: Well-developed, well-nourished female in no acute distress.  HEART: normal rate, heart sounds, regular rhythm RESP: normal effort, lung sounds clear and equal bilaterally  GI: Abd soft, non-tender, gravid appropriate for gestational age.  MS: Extremities nontender, no edema, normal ROM except for right arm, which pt cannot raise above shoulder level Neurologic: Alert and oriented x 4.  GU: Neg CVAT.  Fetal monitoring: FHR 130s, moderate variability, + 10 x 10 accelerations, no decelerations Toco: quiet  Labs:  Results for orders placed or performed during the hospital encounter of 06/14/24 (from the past 24 hours)  Protein / creatinine ratio, urine   Collection Time: 06/14/24  8:11 PM  Result Value Ref  Range   Creatinine, Urine 165 mg/dL   Total Protein, Urine 23 mg/dL   Protein Creatinine Ratio 0.1 <0.2 mg/mg  Urinalysis, Routine w reflex microscopic -Urine, Clean Catch   Collection Time: 06/14/24  8:11 PM  Result Value Ref Range   Color, Urine YELLOW YELLOW   APPearance CLEAR CLEAR   Specific Gravity, Urine 1.020 1.005 - 1.030   pH 7.0  5.0 - 8.0   Glucose, UA NEGATIVE NEGATIVE mg/dL   Hgb urine dipstick NEGATIVE NEGATIVE   Bilirubin Urine NEGATIVE NEGATIVE   Ketones, ur NEGATIVE NEGATIVE mg/dL   Protein, ur NEGATIVE NEGATIVE mg/dL   Nitrite NEGATIVE NEGATIVE   Leukocytes,Ua NEGATIVE NEGATIVE   RBC / HPF 0-5 0 - 5 RBC/hpf   WBC, UA 0-5 0 - 5 WBC/hpf   Bacteria, UA RARE (A) NONE SEEN   Squamous Epithelial / HPF 6-10 0 - 5 /HPF   Mucus PRESENT   CBC   Collection Time: 06/14/24  8:30 PM  Result Value Ref Range   WBC 11.7 (H) 4.0 - 10.5 K/uL   RBC 3.99 3.87 - 5.11 MIL/uL   Hemoglobin 10.8 (L) 12.0 - 15.0 g/dL   HCT 66.3 (L) 63.9 - 53.9 %   MCV 84.2 80.0 - 100.0 fL   MCH 27.1 26.0 - 34.0 pg   MCHC 32.1 30.0 - 36.0 g/dL   RDW 82.5 (H) 88.4 - 84.4 %   Platelets 227 150 - 400 K/uL   nRBC 0.0 0.0 - 0.2 %  Comprehensive metabolic panel with GFR   Collection Time: 06/14/24  8:30 PM  Result Value Ref Range   Sodium 135 135 - 145 mmol/L   Potassium 4.8 3.5 - 5.1 mmol/L   Chloride 102 98 - 111 mmol/L   CO2 25 22 - 32 mmol/L   Glucose, Bld 100 (H) 70 - 99 mg/dL   BUN 8 6 - 20 mg/dL   Creatinine, Ser 9.42 0.44 - 1.00 mg/dL   Calcium  9.1 8.9 - 10.3 mg/dL   Total Protein 6.2 (L) 6.5 - 8.1 g/dL   Albumin 2.9 (L) 3.5 - 5.0 g/dL   AST 17 15 - 41 U/L   ALT 9 0 - 44 U/L   Alkaline Phosphatase 82 38 - 126 U/L   Total Bilirubin 0.2 0.0 - 1.2 mg/dL   GFR, Estimated >39 >39 mL/min   Anion gap 8 5 - 15  Pro Brain natriuretic peptide   Collection Time: 06/14/24  8:30 PM  Result Value Ref Range   Pro Brain Natriuretic Peptide <50.0 <300.0 pg/mL  Troponin T, High Sensitivity   Collection Time: 06/14/24  9:29 PM  Result Value Ref Range   Troponin T High Sensitivity <15 0 - 19 ng/L    Imaging Studies: DG Chest Portable 1 View Result Date: 06/15/2024 EXAM: 1 VIEW(S) XRAY OF THE CHEST 06/15/2024 06:02:25 AM COMPARISON: 05/27/2024 CLINICAL HISTORY: RUQ pain, recent fall, eval ribs FINDINGS: LUNGS AND PLEURA: Low lung volumes.  Bronchovascular crowding versus mild pulmonary edema. No pleural effusion. No pneumothorax. HEART AND MEDIASTINUM: No acute abnormality of the cardiac and mediastinal silhouettes. BONES AND SOFT TISSUES: No acute osseous abnormality. IMPRESSION: 1. Bronchovascular crowding versus mild pulmonary edema. Electronically signed by: Waddell Calk MD 06/15/2024 06:11 AM EST RP Workstation: [id number]   DG Elbow 2 Views Right Result Date: 06/14/2024 EXAM: 1 or 2 VIEW(S) XRAY OF THE RIGHT ELBOW COMPARISON: 07/24/2020 CLINICAL HISTORY: right arm pain, fall FINDINGS: BONES AND JOINTS: Old healed distal humeral diaphyseal fracture partially  visualized. No malalignment. SOFT TISSUES: The soft tissues are unremarkable. IMPRESSION: 1. No acute fracture or dislocation. 2. Old healed distal humeral diaphyseal fracture partially visualized. Electronically signed by: Oneil Devonshire MD 06/14/2024 11:38 PM EST RP Workstation: [id number]   US  Abdomen Limited RUQ (LIVER/GB) Result Date: 06/14/2024 EXAM: Right Upper Quadrant Abdominal Ultrasound 06/14/2024 11:00:06 PM TECHNIQUE: Real-time ultrasonography of the right upper quadrant of the abdomen was performed. COMPARISON: None available. CLINICAL HISTORY: RUQ abdominal pain. FINDINGS: LIVER: Normal echogenicity. No intrahepatic biliary ductal dilatation. No evidence of mass. Patent portal vein with antegrade flow. BILIARY SYSTEM: No pericholecystic fluid or wall thickening. No cholelithiasis. Common bile duct measures 3 mm. Patient very tender to transducer pressure in the right upper quadrant. OTHER: No right upper quadrant ascites. IMPRESSION: 1. Right upper quadrant tenderness with transducer pressure. However, there is no wall thickening, pericholecystic fluid, or cholelithiasis. No biliary dilation. Electronically signed by: Norman Gatlin MD 06/14/2024 11:08 PM EST RP Workstation: [id number]   CT ABDOMEN PELVIS W CONTRAST Result Date: 05/30/2024 EXAM: CT ABDOMEN AND PELVIS  WITH CONTRAST 05/29/2024 03:29:00 PM TECHNIQUE: CT of the abdomen and pelvis was performed with the administration of 75 mL iohexol  (OMNIPAQUE ) 350 MG/ML injection. Multiplanar reformatted images are provided for review. Automated exposure control, iterative reconstruction, and/or weight-based adjustment of the mA/kV was utilized to reduce the radiation dose to as low as reasonably achievable. COMPARISON: Findings are compared to 07/24/2020. CLINICAL HISTORY: Hernia suspected, abdominal wall. FINDINGS: LOWER CHEST: No acute abnormality. LIVER: The liver is unremarkable. GALLBLADDER AND BILE DUCTS: Gallbladder is unremarkable. No biliary ductal dilatation. SPLEEN: No acute abnormality. PANCREAS: No acute abnormality. ADRENAL GLANDS: No acute abnormality. KIDNEYS, URETERS AND BLADDER: No stones in the kidneys or ureters. No hydronephrosis. No perinephric or periureteral stranding. Urinary bladder is unremarkable. GI AND BOWEL: A large right parasagittal infraumbilical abdominal wall hernia is again identified containing multiple unremarkable loops of small bowel. This appears similar to the prior examination, with the hernia defect measuring 6.3 x 9.4 cm and the hernia sac itself measuring 17.1 x 20.8 x 15.5 cm in greatest dimension. A broad-based, shallow, small left parasagittal infraumbilical fat-containing ventral hernia, is also noted . The stomach, small bowel, and large bowel are otherwise unremarkable. There is no bowel obstruction. PERITONEUM AND RETROPERITONEUM: No ascites. No free air. VASCULATURE: IS ALSO NOTED. Aorta is normal in caliber. LYMPH NODES: No lymphadenopathy. REPRODUCTIVE ORGANS: Single fetus in cephalic presentation within the gravid uterus. BONES AND SOFT TISSUES: No acute osseous abnormality. No focal soft tissue abnormality. IMPRESSION: 1. Large right parasagittal infraumbilical abdominal wall hernia containing multiple unremarkable loops of small bowel, similar to the prior examination.  2. Small left parasagittal infraumbilical fat-containing ventral hernia. Electronically signed by: Dorethia Molt MD 05/30/2024 04:34 AM EST RP Workstation: [id number]   US  MFM OB LIMITED Result Date: 05/29/2024 ----------------------------------------------------------------------  OBSTETRICS REPORT                       (Signed Final 05/29/2024 03:56 pm) ---------------------------------------------------------------------- Patient Info  ID #:       [id number]                          D.O.B.:  1986/02/08 (38 yrs)(F)  Name:       Brooke Sandoval                  Visit Date: 05/29/2024 10:17 am ---------------------------------------------------------------------- Performed By  Attending:  Burk Schaible DO       Ref. Address:     [address]                                                             [place], [place]                                                             [address]  Performed By:     Burnard LITTIE Custard          Secondary Phy.:   [facility]                    [facility]                                                             Care  Referred By:      GLENYS GORMAN BIRK          [facility] ---------------------------------------------------------------------- Orders  #  Description                           Code        Ordered By  1  US  MFM OB LIMITED                     76815.01    TANYA PRATT ----------------------------------------------------------------------  #  Order #                     Accession #                Episode #  1  [id number]                   [id number]                 [id number] ---------------------------------------------------------------------- Indications  Decreased fetal movement                       O36.8190  [redacted] weeks gestation of pregnancy                Z3A.25  Unable to hear fetal heart tones as reason     O76  for ultrasound  Obesity complicating pregnancy, second         O99.212   trimester (BMI 57) ---------------------------------------------------------------------- Fetal Evaluation  Num Of Fetuses:  1  Fetal Heart Rate(bpm):  140  Cardiac Activity:       Observed  Presentation:           Cephalic  Placenta:               Anterior  Amniotic Fluid  AFI FV:      Within normal limits                              Largest Pocket(cm)                              5.3  Comment:    Good fetal movement seen. ---------------------------------------------------------------------- OB History  Gravidity:    9         Term:   0        Prem:   7        SAB:   1  TOP:          0       Ectopic:  0        Living: 5 ---------------------------------------------------------------------- Gestational Age  Best:          25w 5d     Det. By:  Early Ultrasound         EDD:   09/06/24                                      (01/22/24) ---------------------------------------------------------------------- Anatomy  Stomach:               Appears normal, left   Bladder:                Appears normal                         sided ---------------------------------------------------------------------- Cervix Uterus Adnexa  Cervix  Not visualized (advanced GA >24wks)  Adnexa  No abnormality visualized ---------------------------------------------------------------------- Comments  Hospital Ultrasound  The patient presented to the MAU for decreased FM. US  was  ordered due to inability to find the fetal heart rate likely due to  maternal body habitus.  Sonographic findings  Single intrauterine pregnancy at 25w 5d  Fetal cardiac activity: Observed and appears normal.  Presentation: Cephalic.  Amniotic fluid volume: Within normal limits. MVP: 5.3 cm.  Placenta: Anterior.  Recommendations  - Continue clinical management per OB provider.  This was a limited ultrasound with a remote read. If an official  MFM consult is requested for any reason please call/place an  order in Epic.  ----------------------------------------------------------------------                  Delora Smaller, DO Electronically Signed Final Report   05/29/2024 03:56 pm ----------------------------------------------------------------------   CT Angio Chest Pulmonary Embolism (PE) W or WO Contrast Result Date: 05/29/2024 EXAM: CTA of the Chest with contrast for PE 05/29/2024 03:29:00 PM TECHNIQUE: CTA of the chest was performed after the administration of 75 mL of iohexol  (OMNIPAQUE ) 350 MG/ML injection. Multiplanar reformatted images are provided for review. MIP images are provided for review. Automated exposure control, iterative reconstruction, and/or weight based adjustment of the mA/kV was utilized to reduce the radiation dose to as low as reasonably achievable. COMPARISON: None available. CLINICAL HISTORY: Pulmonary embolism (PE) suspected, high prob. FINDINGS: PULMONARY ARTERIES:  Limited exam due to soft tissue attenuation artifact secondary to body habitus. There is no definite evidence of large pulmonary embolus seen in the main pulmonary artery or the proximal portions of the left and right pulmonary arteries. There is limited evaluation of the more peripheral branches due to above artifact and therefore smaller and more peripheral pulmonary emboli cannot be excluded on the basis of this exam. Main pulmonary artery is normal in caliber. MEDIASTINUM: The heart and pericardium demonstrate no acute abnormality. There is no acute abnormality of the thoracic aorta. LYMPH NODES: No mediastinal, hilar or axillary lymphadenopathy. LUNGS AND PLEURA: The lungs are without acute process. No focal consolidation or pulmonary edema. No pleural effusion or pneumothorax. UPPER ABDOMEN: Limited images of the upper abdomen are unremarkable. SOFT TISSUES AND BONES: No acute bone or soft tissue abnormality. IMPRESSION: 1. No definite evidence of large pulmonary embolus in the main pulmonary artery or proximal portions of the left  and right pulmonary arteries. 2. Evaluation of the peripheral pulmonary arterial branches is limited by soft tissue attenuation from body habitus, and smaller or more peripheral emboli cannot be excluded. Electronically signed by: Lynwood Seip MD 05/29/2024 03:51 PM EST RP Workstation: [id number]   VAS US  LOWER EXTREMITY VENOUS (DVT) Result Date: 05/28/2024  Lower Venous DVT Study Patient Name:  Brooke Sandoval  Date of Exam:   05/28/2024 Medical Rec #: [id number]       Accession #:    [id number] Date of Birth: Jan 23, 1986        Patient Gender: F Patient Age:   64 years Exam Location:  [facility] Procedure:      VAS US  LOWER EXTREMITY VENOUS (DVT) Referring Phys: SUZEN NEWTON --------------------------------------------------------------------------------  Indications: Pulmonary embolism. Other Indications: [redacted] weeks pregnant. Anticoagulation: Lovenox . Comparison Study: Previous study on the left lower extremity on 10.27.2023. Performing Technologist: Edilia Elden Appl  Examination Guidelines: A complete evaluation includes B-mode imaging, spectral Doppler, color Doppler, and power Doppler as needed of all accessible portions of each vessel. Bilateral testing is considered an integral part of a complete examination. Limited examinations for reoccurring indications may be performed as noted. The reflux portion of the exam is performed with the patient in reverse Trendelenburg.  +---------+---------------+---------+-----------+----------+----------------+ RIGHT    CompressibilityPhasicitySpontaneityPropertiesThrombus Aging   +---------+---------------+---------+-----------+----------+----------------+ CFV      Full           Yes      Yes                                   +---------+---------------+---------+-----------+----------+----------------+ SFJ      Full           Yes      Yes                                    +---------+---------------+---------+-----------+----------+----------------+ FV Prox  Full                                                          +---------+---------------+---------+-----------+----------+----------------+ FV Mid   Full                                                          +---------+---------------+---------+-----------+----------+----------------+  FV DistalFull                                                          +---------+---------------+---------+-----------+----------+----------------+ PFV      Full                                                          +---------+---------------+---------+-----------+----------+----------------+ POP      Full           Yes      Yes                                   +---------+---------------+---------+-----------+----------+----------------+ PTV                                                   Patent by color. +---------+---------------+---------+-----------+----------+----------------+ PERO                                                  Patent by color. +---------+---------------+---------+-----------+----------+----------------+   +---------+---------------+---------+-----------+----------+----------------+ LEFT     CompressibilityPhasicitySpontaneityPropertiesThrombus Aging   +---------+---------------+---------+-----------+----------+----------------+ CFV      Full           Yes      Yes                                   +---------+---------------+---------+-----------+----------+----------------+ SFJ      Full                                                          +---------+---------------+---------+-----------+----------+----------------+ FV Prox  Full                                                          +---------+---------------+---------+-----------+----------+----------------+ FV Mid   Full                                                           +---------+---------------+---------+-----------+----------+----------------+ FV DistalFull                                                          +---------+---------------+---------+-----------+----------+----------------+  PFV      Full                                                          +---------+---------------+---------+-----------+----------+----------------+ POP      Full           Yes      Yes                                   +---------+---------------+---------+-----------+----------+----------------+ PTV                                                   Patent by color. +---------+---------------+---------+-----------+----------+----------------+ PERO                                                  Patent by color. +---------+---------------+---------+-----------+----------+----------------+     Summary: RIGHT: - There is no evidence of deep vein thrombosis in the lower extremity. However, portions of this examination were limited- see technologist comments above.  - No cystic structure found in the popliteal fossa.  LEFT: - There is no evidence of deep vein thrombosis in the lower extremity. However, portions of this examination were limited- see technologist comments above.  - No cystic structure found in the popliteal fossa.  *See table(s) above for measurements and observations. Electronically signed by Debby Robertson on 05/28/2024 at 4:06:56 PM.    Final    ECHOCARDIOGRAM COMPLETE Result Date: 05/28/2024    ECHOCARDIOGRAM REPORT   Patient Name:   Brooke Sandoval Date of Exam: 05/28/2024 Medical Rec #:  [id number]      Height:       67.0 in Accession #:    [id number]     Weight:       392.4 lb Date of Birth:  05/02/1986       BSA:          2.693 m Patient Age:    38 years       BP:           123/55 mmHg Patient Gender: F              HR:           96 bpm. Exam Location:  Inpatient Procedure: 2D Echo, Cardiac Doppler and Color Doppler (Both Spectral and Color             Flow Doppler were utilized during procedure). Indications:    Congestive Heart Failure I50.9  History:        Patient has no prior history of Echocardiogram examinations.                 CHF.  Sonographer:    Jayson Gaskins Referring Phys: [id number] [facility] NILES NEWTON  Sonographer Comments: No Definity used due to pregnancy. IMPRESSIONS  1. Left ventricular ejection fraction, by estimation, is 55 to 60%. The left ventricle has normal function. Left ventricular endocardial border not optimally defined to evaluate regional wall  motion. Left ventricular diastolic parameters were normal.  2. Right ventricular systolic function is normal. The right ventricular size is normal.  3. The mitral valve is grossly normal. Trivial mitral valve regurgitation.  4. The aortic valve was not well visualized. Aortic valve regurgitation is not visualized. Comparison(s): Technically difficutl evaluation but overall normal echocardiogram. FINDINGS  Left Ventricle: Left ventricular ejection fraction, by estimation, is 55 to 60%. The left ventricle has normal function. Left ventricular endocardial border not optimally defined to evaluate regional wall motion. The left ventricular internal cavity size was normal in size. There is borderline left ventricular hypertrophy. Left ventricular diastolic parameters were normal. Right Ventricle: The right ventricular size is normal. No increase in right ventricular wall thickness. Right ventricular systolic function is normal. Left Atrium: Left atrial size was normal in size. Right Atrium: Right atrial size was normal in size. Pericardium: There is no evidence of pericardial effusion. Presence of epicardial fat layer. Mitral Valve: The mitral valve is grossly normal. Trivial mitral valve regurgitation. Tricuspid Valve: The tricuspid valve is grossly normal. Tricuspid valve regurgitation is trivial. Aortic Valve: The aortic valve was not well visualized. Aortic valve regurgitation is not  visualized. Aortic valve mean gradient measures 5.0 mmHg. Aortic valve peak gradient measures 8.3 mmHg. Aortic valve area, by VTI measures 3.37 cm. Pulmonic Valve: The pulmonic valve was not well visualized. Pulmonic valve regurgitation is not visualized. Aorta: The aortic root and ascending aorta are structurally normal, with no evidence of dilitation. IAS/Shunts: The interatrial septum was not well visualized.  LEFT VENTRICLE PLAX 2D LVIDd:         4.20 cm   Diastology LVIDs:         3.40 cm   LV e' medial:    7.40 cm/s LV PW:         1.20 cm   LV E/e' medial:  10.6 LV IVS:        0.90 cm   LV e' lateral:   11.20 cm/s LVOT diam:     2.00 cm   LV E/e' lateral: 7.0 LV SV:         89 LV SV Index:   33 LVOT Area:     3.14 cm  RIGHT VENTRICLE RV S prime:     13.40 cm/s TAPSE (M-mode): 2.4 cm LEFT ATRIUM             Index        RIGHT ATRIUM           Index LA Vol (A2C):   35.9 ml 13.33 ml/m  RA Area:     10.10 cm LA Vol (A4C):   51.1 ml 18.97 ml/m  RA Volume:   19.80 ml  7.35 ml/m LA Biplane Vol: 42.7 ml 15.86 ml/m  AORTIC VALVE AV Area (Vmax):    2.90 cm AV Area (Vmean):   2.92 cm AV Area (VTI):     3.37 cm AV Vmax:           144.00 cm/s AV Vmean:          112.000 cm/s AV VTI:            0.264 m AV Peak Grad:      8.3 mmHg AV Mean Grad:      5.0 mmHg LVOT Vmax:         133.00 cm/s LVOT Vmean:        104.000 cm/s LVOT VTI:          0.283  m LVOT/AV VTI ratio: 1.07  AORTA Ao Root diam: 2.70 cm Ao Asc diam:  3.70 cm MITRAL VALVE MV Area (PHT): 3.21 cm    SHUNTS MV Decel Time: 236 msec    Systemic VTI:  0.28 m MV E velocity: 78.10 cm/s  Systemic Diam: 2.00 cm MV A velocity: 91.20 cm/s MV E/A ratio:  0.86 Franck Azobou Tonleu Electronically signed by Joelle Cedars Tonleu Signature Date/Time: 05/28/2024/9:32:56 AM    Final    DG Chest Portable 1 View Result Date: 05/27/2024 CLINICAL DATA:  Shortness of breath.  Cough. EXAM: PORTABLE CHEST 1 VIEW COMPARISON:  Radiograph 05/15/2024 FINDINGS: The cardiomediastinal  contours are normal. The lungs are clear. Pulmonary vasculature is normal. No consolidation, pleural effusion, or pneumothorax. No acute osseous abnormalities are seen. IMPRESSION: No acute findings. Electronically Signed   By: Andrea Gasman M.D.   On: 05/27/2024 19:47   US  MFM OB FOLLOW UP Result Date: 05/27/2024 ----------------------------------------------------------------------  OBSTETRICS REPORT                       (Signed Final 05/27/2024 02:43 pm) ---------------------------------------------------------------------- Patient Info  ID #:       [id number]                          D.O.B.:  09/24/1985 (38 yrs)(F)  Name:       Brooke Sandoval                  Visit Date: 05/27/2024 02:04 pm ---------------------------------------------------------------------- Performed By  Attending:        Nathanel Fetters      Ref. Address:     [address]                    [place]                                                             [place], [place]                                                             [address]  Performed By:     Meade Parker       [facility]  Referred By:      [facility] ---------------------------------------------------------------------- Orders  #  Description                           Code        Ordered By  1  US  MFM OB FOLLOW UP                   [phone]    BABARA KEYS ----------------------------------------------------------------------  #  Order #                     Accession #                Episode #  1  [id number]                   [id number]                 [id number] ---------------------------------------------------------------------- Indications  [redacted] weeks gestation of pregnancy                Z3A.25  Abdominal  pain in pregnancy                    O99.89  Obesity complicating pregnancy, second         O99.212  trimester (BMI 83)  Advanced maternal age multigravida 32+,        O21.522  second trimester (38 yrs)  Previous cesarean delivery, antepartum (x5)    O76.219  Grand multiparity, antepartum                  O09.40  Poor obstetric history: Previous preterm       O09.219  delivery, antepartum (No term deliveries)  Pre-existing essential hypertension            O10.012  complicating pregnancy, second trimester  Medical complication of pregnancy (Hepatic     O26.90  steatosis, ventral hernia)(Hx of DVT, SUD,  CHF [Lasix ], bowel injury)  Poor obstetric history: Previous               O09.299  preeclampsia, GDM, PE (on Lovenox ),  HELLP  Protein S deficiency complicating pregnancy    O99.199  Poor obstetric history: Previous IUFD          O15.299  (stillbirth) x2, 24w 2007, 24w 2011)  Abnormal chromosomal and genetic finding       O28.5  on antenatal screening of mother (Low fetal  fraction on Panorama, LOW RISK [facility]  NIPS)  Encounter for other antenatal screening        Z36.2  follow-up  Obesity complicating pregnancy, second         O99.212  trimester (BMI 57)  Encounter for other antenatal screening        Z36.2  follow-up ---------------------------------------------------------------------- Fetal Evaluation  Num Of Fetuses:         1  Cardiac Activity:       Observed  Presentation:           Transverse, head to maternal left  Placenta:               Anterior  P. Cord Insertion:      Previously seen  Amniotic Fluid  AFI FV:      Within normal limits  Largest Pocket(cm)                              5.28 ---------------------------------------------------------------------- Biometry  BPD:      66.2  mm     G. Age:  26w 5d         82  %    CI:        80.28   %    70 - 86                                                          FL/HC:      20.3   %    18.7 - 20.3  HC:      233.4  mm     G. Age:   25w 3d         25  %    HC/AC:      1.09        1.04 - 1.22  AC:      214.3  mm     G. Age:  26w 0d         57  %    FL/BPD:     71.6   %    71 - 87  FL:       47.4  mm     G. Age:  25w 6d         49  %    FL/AC:      22.1   %    20 - 24  CER:      26.6  mm     G. Age:  23w 6d         14  %  LV:        4.8  mm  CM:        4.9  mm  Est. FW:     865  gm    1 lb 15 oz      60  % ---------------------------------------------------------------------- OB History  Gravidity:    9         Term:   0        Prem:   7        SAB:   1  TOP:          0       Ectopic:  0        Living: 5 ---------------------------------------------------------------------- Gestational Age  U/S Today:     26w 0d                                        EDD:   09/02/24  Best:          25w 3d     Det. By:  Early Ultrasound         EDD:   09/06/24                                      (01/22/24) ---------------------------------------------------------------------- Targeted Anatomy  Central Nervous System  Calvarium/Cranial V.:  Appears normal  Cereb./Vermis:          Not well visualized  Cavum:                 Appears normal         Cisterna Magna:         Not well visualized  Lateral Ventricles:    Appears normal         Midline Falx:           Appears normal  Choroid Plexus:        Not well visualized  Spine  Cervical:              Not well visualized    Sacral:                 Not well visualized  Thoracic:              Not well visualized    Shape/Curvature:        Not well visualized  Lumbar:                Not well visualized  Head/Neck  Lips:                  Not well visualized    Profile:                Appears normal  Neck:                  Not well visualized    Orbits/Eyes:            Lenses nwv  Nuchal Fold:           Not applicable         Mandible:               Not well visualized  Nasal Bone:            Present                Maxilla:                Not well visualized  Thorax  4 Chamber View:        Appears normal          Interventr. Septum:     Appears normal  Cardiac Rhythm:        Normal                 Cardiac Axis:           Appears normal  Cardiac Situs:         Appears normal         Diaphragm:              Appears normal  Rt Outflow Tract:      Not well visualized    3 Vessel View:          Appears normal  Lt Outflow Tract:      Appears normal         3 V Trachea View:       Not well visualized  Aortic Arch:           Not well visualized    IVC:                    Appears normal  Ductal Arch:           Previously seen  Crossing:               Appears normal  SVC:                   Appears normal  Abdomen  Ventral Wall:          Appears normal         Lt Kidney:              Not well visualized  Cord Insertion:        Appears normal         Rt Kidney:              Not well visualized  Situs:                 Appears normal         Bladder:                Previously seen  Stomach:               Appears normal  Extremities  Lt Humerus:            Previously seen        Lt Femur:               Previously seen  Rt Humerus:            Previously seen        Rt Femur:               Previously seen  Lt Forearm:            Previously seen        Lt Lower Leg:           Previously seen  Rt Forearm:            Previously seen        Rt Lower Leg:           Previously seen  Lt Hand:               Previously seen        Lt Foot:                limited lateral view  Rt Hand:               Not well visualized    Rt Foot:                Not well visualized  Other  Umbilical Cord:        Previously seen        Genitalia:              Female prev seen  Comment:     Technically difficult due to hernia in maternal RLQ, BMI 57, and               fetal position. ---------------------------------------------------------------------- Cervix Uterus Adnexa  Cervix  Not visualized (advanced GA >24wks)  Uterus  No abnormality visualized.  Right Ovary  Not visualized.  Left Ovary  Not visualized.  Cul De Sac  No free fluid seen.  Adnexa  No  abnormality visualized ---------------------------------------------------------------------- Impression  MFM Consultation  Ms. Fiorillo is a 38 yo G9P5 at 38 w 3 d with an EDD of  09/06/24.  She is here for follow up growth exam.  She has multiple issue complicating her pregnancy  Most notably  CHTN  BMI 50  CHF  managed with Lasix   H/o DVT on Lovenox .  She denies s/sx of PTL or Preeclampsia however, she  complains of worsening SOB and coughing at night when she  lays down. She says she cannot sleep at night either.  She has a f/u maternal echo today and scheduled to see  cardiology on Friday.           05/27/2024    1:17 PM       05/15/2024    9:30 PM  PM       05/15/2024    5:49 PM  Vitals with BMI  Systolic 136      124      145  Diastolic92       69       82  Pulse             89       95  Imaging:  SIUP with measurements consistent with dates  Good fetal movement and amniotic fluid again observed  Suboptimal views of the fetal anatomy was again seen  secondary to fetal position and maternal habitus.  Impression/Counseling:  1) CHF- I discussed with Ms Fonner her symptoms and  recommended she go to [facility] for further evaluation after her  echo (they will likely want to get one anyway).  May need additional Lasix . Will see after echocardiogram.  Consider consulting cards if admitted.  BNP on 11/20 was 20.  2) CHTN-BP stable on Labetalol   3) h/o PE on Lovenox .  -Needs AntiXa assessment again.  Repeat growth scheduled in 3-4 weeks.  I spent 30 minutes with > 50% in face to face consultation  Nathanel DOROTHA Fetters, MD ----------------------------------------------------------------------              Nathanel Fetters, MD Electronically Signed Final Report   05/27/2024 02:43 pm ----------------------------------------------------------------------     Assessment and Plan: 1. Abdominal pain: admit for pain control, differential includes hernia incarceration, abruption, preterm labor, gall bladder, round ligament among  others. Patient with significant past medical history. CT imaging recommended to r/o incarcerated hernia and/or evaluate for other causes of her pain. She unfortunately was unable to tolerate it. Unable to discharge patient given persistent severe pain. - CT imaging with premedication when patient feels she can tolerate it - add amylase, lipase, fibrinogen  - pain meds prn   2. FWB: NST reactive, BPP 6/8, repeat BPP this afternoon  3. Respiratory: albuterol  & duoneb prn, breo ordered, continue lasix   4. Heme: continue lovenox  100 mg BID   5. CHF: continue lasix , CXR with low lung volumes causing bronchovascular crowding vs pulmonary edema, more likely low lung volumes  6. GDMA: recent diagnosis, BG monitoring with SSI as needed  7. CHTN: Bps normal, continue labetalol  200 mg BID  8. Dispo: admit  Rollo ONEIDA Bring, MD, FACOG Obstetrician & Gynecologist, [facility] for [facility], [facility]    [1]  Allergies Allergen Reactions   Prednisone  Swelling

## 2024-06-15 NOTE — Plan of Care (Signed)
  Problem: Education: Goal: Ability to describe self-care measures that may prevent or decrease complications (Diabetes Survival Skills Education) will improve Outcome: Progressing Goal: Individualized Educational Video(s) Outcome: Progressing   Problem: Coping: Goal: Ability to adjust to condition or change in health will improve Outcome: Progressing   Problem: Fluid Volume: Goal: Ability to maintain a balanced intake and output will improve Outcome: Progressing   Problem: Health Behavior/Discharge Planning: Goal: Ability to identify and utilize available resources and services will improve Outcome: Progressing Goal: Ability to manage health-related needs will improve Outcome: Progressing   Problem: Metabolic: Goal: Ability to maintain appropriate glucose levels will improve Outcome: Progressing   Problem: Nutritional: Goal: Maintenance of adequate nutrition will improve Outcome: Progressing Goal: Progress toward achieving an optimal weight will improve Outcome: Progressing   Problem: Skin Integrity: Goal: Risk for impaired skin integrity will decrease Outcome: Progressing   Problem: Tissue Perfusion: Goal: Adequacy of tissue perfusion will improve Outcome: Progressing   Problem: Education: Goal: Knowledge of disease or condition will improve Outcome: Progressing Goal: Knowledge of the prescribed therapeutic regimen will improve Outcome: Progressing Goal: Individualized Educational Video(s) Outcome: Progressing   Problem: Clinical Measurements: Goal: Complications related to the disease process, condition or treatment will be avoided or minimized Outcome: Progressing   Problem: Education: Goal: Knowledge of General Education information will improve Description: Including pain rating scale, medication(s)/side effects and non-pharmacologic comfort measures Outcome: Progressing   Problem: Health Behavior/Discharge Planning: Goal: Ability to manage health-related  needs will improve Outcome: Progressing   Problem: Clinical Measurements: Goal: Ability to maintain clinical measurements within normal limits will improve Outcome: Progressing Goal: Will remain free from infection Outcome: Progressing Goal: Diagnostic test results will improve Outcome: Progressing Goal: Respiratory complications will improve Outcome: Progressing Goal: Cardiovascular complication will be avoided Outcome: Progressing   Problem: Activity: Goal: Risk for activity intolerance will decrease Outcome: Progressing   Problem: Nutrition: Goal: Adequate nutrition will be maintained Outcome: Progressing   Problem: Coping: Goal: Level of anxiety will decrease Outcome: Progressing   Problem: Elimination: Goal: Will not experience complications related to bowel motility Outcome: Progressing Goal: Will not experience complications related to urinary retention Outcome: Progressing   Problem: Pain Managment: Goal: General experience of comfort will improve and/or be controlled Outcome: Progressing   Problem: Safety: Goal: Ability to remain free from injury will improve Outcome: Progressing   Problem: Skin Integrity: Goal: Risk for impaired skin integrity will decrease Outcome: Progressing

## 2024-06-15 NOTE — Progress Notes (Signed)
 Adult [facility] called to come and assess patient. Audible wheezes still heard after her nebs, o2 [place] increased to 3LPM at this time because patient continues to sat down to 88-90 on 2 LPM. Dr. Zina walking in to assess patient. Patient c/o decreased FM, ultrasound to room for BPP due to the patient breathing issues.

## 2024-06-16 ENCOUNTER — Other Ambulatory Visit (HOSPITAL_COMMUNITY): Payer: Self-pay

## 2024-06-16 ENCOUNTER — Ambulatory Visit: Payer: Self-pay | Admitting: Family Medicine

## 2024-06-16 DIAGNOSIS — R0600 Dyspnea, unspecified: Secondary | ICD-10-CM | POA: Diagnosis not present

## 2024-06-16 DIAGNOSIS — O26893 Other specified pregnancy related conditions, third trimester: Secondary | ICD-10-CM | POA: Diagnosis not present

## 2024-06-16 DIAGNOSIS — Z7951 Long term (current) use of inhaled steroids: Secondary | ICD-10-CM | POA: Diagnosis not present

## 2024-06-16 DIAGNOSIS — O99513 Diseases of the respiratory system complicating pregnancy, third trimester: Secondary | ICD-10-CM | POA: Diagnosis present

## 2024-06-16 DIAGNOSIS — O10913 Unspecified pre-existing hypertension complicating pregnancy, third trimester: Secondary | ICD-10-CM

## 2024-06-16 DIAGNOSIS — Z6841 Body Mass Index (BMI) 40.0 and over, adult: Secondary | ICD-10-CM | POA: Diagnosis not present

## 2024-06-16 DIAGNOSIS — O24410 Gestational diabetes mellitus in pregnancy, diet controlled: Secondary | ICD-10-CM | POA: Insufficient documentation

## 2024-06-16 DIAGNOSIS — O99213 Obesity complicating pregnancy, third trimester: Secondary | ICD-10-CM | POA: Diagnosis present

## 2024-06-16 DIAGNOSIS — T380X5A Adverse effect of glucocorticoids and synthetic analogues, initial encounter: Secondary | ICD-10-CM | POA: Diagnosis present

## 2024-06-16 DIAGNOSIS — O99413 Diseases of the circulatory system complicating pregnancy, third trimester: Secondary | ICD-10-CM | POA: Diagnosis present

## 2024-06-16 DIAGNOSIS — Z833 Family history of diabetes mellitus: Secondary | ICD-10-CM | POA: Diagnosis not present

## 2024-06-16 DIAGNOSIS — O09293 Supervision of pregnancy with other poor reproductive or obstetric history, third trimester: Secondary | ICD-10-CM | POA: Diagnosis not present

## 2024-06-16 DIAGNOSIS — Z3A28 28 weeks gestation of pregnancy: Secondary | ICD-10-CM | POA: Diagnosis not present

## 2024-06-16 DIAGNOSIS — I110 Hypertensive heart disease with heart failure: Secondary | ICD-10-CM | POA: Diagnosis present

## 2024-06-16 DIAGNOSIS — J9601 Acute respiratory failure with hypoxia: Secondary | ICD-10-CM | POA: Diagnosis present

## 2024-06-16 DIAGNOSIS — O99113 Other diseases of the blood and blood-forming organs and certain disorders involving the immune mechanism complicating pregnancy, third trimester: Secondary | ICD-10-CM | POA: Diagnosis present

## 2024-06-16 DIAGNOSIS — R109 Unspecified abdominal pain: Secondary | ICD-10-CM | POA: Diagnosis present

## 2024-06-16 DIAGNOSIS — K469 Unspecified abdominal hernia without obstruction or gangrene: Secondary | ICD-10-CM | POA: Diagnosis not present

## 2024-06-16 DIAGNOSIS — B348 Other viral infections of unspecified site: Secondary | ICD-10-CM | POA: Diagnosis not present

## 2024-06-16 DIAGNOSIS — O2233 Deep phlebothrombosis in pregnancy, third trimester: Secondary | ICD-10-CM

## 2024-06-16 DIAGNOSIS — R1013 Epigastric pain: Secondary | ICD-10-CM | POA: Diagnosis not present

## 2024-06-16 DIAGNOSIS — Z86711 Personal history of pulmonary embolism: Secondary | ICD-10-CM | POA: Diagnosis not present

## 2024-06-16 DIAGNOSIS — I5033 Acute on chronic diastolic (congestive) heart failure: Secondary | ICD-10-CM | POA: Diagnosis not present

## 2024-06-16 DIAGNOSIS — G8929 Other chronic pain: Secondary | ICD-10-CM | POA: Diagnosis present

## 2024-06-16 DIAGNOSIS — I5032 Chronic diastolic (congestive) heart failure: Secondary | ICD-10-CM | POA: Diagnosis present

## 2024-06-16 DIAGNOSIS — W19XXXA Unspecified fall, initial encounter: Secondary | ICD-10-CM | POA: Diagnosis present

## 2024-06-16 DIAGNOSIS — R0609 Other forms of dyspnea: Secondary | ICD-10-CM | POA: Diagnosis not present

## 2024-06-16 DIAGNOSIS — O34219 Maternal care for unspecified type scar from previous cesarean delivery: Secondary | ICD-10-CM | POA: Diagnosis present

## 2024-06-16 DIAGNOSIS — J45901 Unspecified asthma with (acute) exacerbation: Secondary | ICD-10-CM | POA: Diagnosis present

## 2024-06-16 DIAGNOSIS — O24419 Gestational diabetes mellitus in pregnancy, unspecified control: Secondary | ICD-10-CM | POA: Diagnosis present

## 2024-06-16 DIAGNOSIS — K439 Ventral hernia without obstruction or gangrene: Secondary | ICD-10-CM | POA: Diagnosis present

## 2024-06-16 DIAGNOSIS — D6859 Other primary thrombophilia: Secondary | ICD-10-CM | POA: Diagnosis present

## 2024-06-16 DIAGNOSIS — O0990 Supervision of high risk pregnancy, unspecified, unspecified trimester: Secondary | ICD-10-CM

## 2024-06-16 DIAGNOSIS — Z1152 Encounter for screening for COVID-19: Secondary | ICD-10-CM | POA: Diagnosis not present

## 2024-06-16 DIAGNOSIS — Z79899 Other long term (current) drug therapy: Secondary | ICD-10-CM | POA: Diagnosis not present

## 2024-06-16 DIAGNOSIS — O368130 Decreased fetal movements, third trimester, not applicable or unspecified: Secondary | ICD-10-CM | POA: Diagnosis present

## 2024-06-16 LAB — CBC
HCT: 30.6 % — ABNORMAL LOW (ref 36.0–46.0)
Hemoglobin: 9.8 g/dL — ABNORMAL LOW (ref 12.0–15.0)
MCH: 27.2 pg (ref 26.0–34.0)
MCHC: 32 g/dL (ref 30.0–36.0)
MCV: 85 fL (ref 80.0–100.0)
Platelets: 217 K/uL (ref 150–400)
RBC: 3.6 MIL/uL — ABNORMAL LOW (ref 3.87–5.11)
RDW: 17.8 % — ABNORMAL HIGH (ref 11.5–15.5)
WBC: 10 K/uL (ref 4.0–10.5)
nRBC: 0.3 % — ABNORMAL HIGH (ref 0.0–0.2)

## 2024-06-16 LAB — GLUCOSE, CAPILLARY
Glucose-Capillary: 93 mg/dL (ref 70–99)
Glucose-Capillary: 143 mg/dL — ABNORMAL HIGH (ref 70–99)
Glucose-Capillary: 140 mg/dL — ABNORMAL HIGH (ref 70–99)
Glucose-Capillary: 151 mg/dL — ABNORMAL HIGH (ref 70–99)

## 2024-06-16 LAB — COMPREHENSIVE METABOLIC PANEL WITH GFR
ALT: 12 U/L (ref 0–44)
AST: 18 U/L (ref 15–41)
Albumin: 3.1 g/dL — ABNORMAL LOW (ref 3.5–5.0)
Alkaline Phosphatase: 75 U/L (ref 38–126)
Anion gap: 9 (ref 5–15)
BUN: 14 mg/dL (ref 6–20)
CO2: 26 mmol/L (ref 22–32)
Calcium: 8.9 mg/dL (ref 8.9–10.3)
Chloride: 99 mmol/L (ref 98–111)
Creatinine, Ser: 0.7 mg/dL (ref 0.44–1.00)
GFR, Estimated: >60 mL/min
Glucose, Bld: 131 mg/dL — ABNORMAL HIGH (ref 70–99)
Potassium: 4.4 mmol/L (ref 3.5–5.1)
Sodium: 134 mmol/L — ABNORMAL LOW (ref 135–145)
Total Bilirubin: <0.2 mg/dL (ref 0.0–1.2)
Total Protein: 6.2 g/dL — ABNORMAL LOW (ref 6.5–8.1)

## 2024-06-16 MED ORDER — BISACODYL 5 MG PO TBEC
5.0000 mg | DELAYED_RELEASE_TABLET | Freq: Every day | ORAL | Status: DC | PRN
  Administered 2024-06-16 – 2024-06-18 (×2): 5 mg via ORAL
  Filled 2024-06-16 (×2): qty 1

## 2024-06-16 NOTE — Progress Notes (Signed)
 " PROGRESS NOTE    Brooke Sandoval  FMW:[id number] DOB: Oct 26, 1985 DOA: 06/14/2024 PCP: Patient, No Pcp Per   Brief Narrative:     Brooke Sandoval is a 38 y.o. female with past medical history of G9P0715 asthma, diastolic CHF, protein S deficiency, DVT/pulmonary embolism, anxiety, and morbid obesity  presents with shortness of breath, upper abdominal pain, and right arm cramping after having a fall yesterday.   She experiences persistent wheezing and coughing. The patient reports difficulty breathing, especially when lying down or trying to sleep. She has been taking Lasix  as prescribed and denies missing any doses recently. She describes a sensation of 'drowning' when drinking.   Labs obtained yesterday noted WBC 11.7, hemoglobin 10.8, proBNP less than 50, and high-sensitivity troponin 15.  Chest x-ray noted bronchial vascular crowding versus mild pulmonary edema.  Currently on therapeutic Lovenox .  She is currently using 4 L/min of supplemental oxygen, initiated on scheduled nebulizers as well as prednisone  therapy.  Tested positive for rhinovirus/enterovirus.  Hospitalist consulted in regards to acute respiratory failure.   Assessment & Plan:   Principal Problem:   Abdominal pain Active Problems:   Hernia of abdominal cavity   Decreased fetal movements in third trimester   Rhinovirus infection   Intrauterine pregnancy - Per OB/GYN   Acute respiratory failure with hypoxia Diastolic congestive heart failure exacerbation and/or asthma exacerbation versus pulmonary embolism versus other Rhinovirus positive  -Multifactorial with chronic diastolic heart failure, obesity, pregnancy status send positive for rhinovirus/enterovirus.  Also having large ventral hernia with some pain likely causing some breathing issues. - Continue nasal cannula oxygen and wean to room air when stable.  Currently stable 4 L/min -CT angiogram chest negative for PE.  Heterogeneous lung parenchyma, likely small  airway disease. -Will continue scheduled nebulizer, also initiated on Breo Ellipta  - Currently Lasix  40 mg twice daily which is her home dose.   -Continue prednisone  40 mg daily - DuoNebs 4 times daily and as needed - Mucinex    History of DVT/pulmonary embolism Protein S deficiency - CT angiogram chest is negative for PE - Continue  Lovenox      Hiatal hernia - CT abdomen pelvis shows ventral hernia, no complications.  Generalized body edema worse than before   TRH will continue to follow the patient.   DVT prophylaxis: lovenox  Code Status:  full Family Communication: none Disposition Plan: Status is: Inpatient Remains inpatient appropriate because: Acute hypoxic respiratory failure/ongoing pain  Subjective:  Patient seen and examined at the bedside.  Her main issue is abdominal pain which she thinks is because of large hiatal hernia.  She complains of right upper quadrant pain which is worse at times.  No nausea vomiting.  Vital signs are stable oxygen requirement 4 L/min  Objective: Vitals:   06/16/24 0501 06/16/24 0822 06/16/24 0929 06/16/24 1229  BP: (!) 115/51 (!) 99/40 120/60 (!) 143/78  Pulse: 87 91 84 79  Resp: (!) 23 20  20   Temp: 98.1 F (36.7 C) 98.1 F (36.7 C)  98.1 F (36.7 C)  TempSrc: Oral Oral  Oral  SpO2: 95% 96%  99%  Weight:      Height:        Intake/Output Summary (Last 24 hours) at 06/16/2024 1602 Last data filed at 06/15/2024 1743 Gross per 24 hour  Intake --  Output 600 ml  Net -600 ml   Filed Weights   06/14/24 1807  Weight: (!) 185.5 kg    Examination:  General: Alert, oriented not in any acute  distress Chest: Mildly diminished bilaterally, no wheezing or crackles heard CVS: S1, S2, no murmur, regular rhythm Abdomen: Distended, nontender to palpation, large ventral hernia Extremities: Bilateral pitting edema  Data Reviewed: I have personally reviewed following labs and imaging studies  CBC: Recent Labs  Lab 06/13/24 0815  06/14/24 2030 06/16/24 0459  WBC 9.5 11.7* 10.0  HGB 10.3* 10.8* 9.8*  HCT 33.9* 33.6* 30.6*  MCV 88 84.2 85.0  PLT 220 227 217   Basic Metabolic Panel: Recent Labs  Lab 06/13/24 1052 06/14/24 2030 06/16/24 0459  NA 137 135 134*  K 4.3 4.8 4.4  CL 99 102 99  CO2 23 25 26   GLUCOSE 165* 100* 131*  BUN 6 8 14   CREATININE 0.64 0.57 0.70  CALCIUM  8.6* 9.1 8.9   GFR: Estimated Creatinine Clearance: 166 mL/min (by C-G formula based on SCr of 0.7 mg/dL). Liver Function Tests: Recent Labs  Lab 06/13/24 1052 06/14/24 2030 06/16/24 0459  AST 11 17 18   ALT 10 9 12   ALKPHOS 81 82 75  BILITOT <0.2 0.2 <0.2  PROT 5.6* 6.2* 6.2*  ALBUMIN 3.0* 2.9* 3.1*   Recent Labs  Lab 06/15/24 0718  LIPASE 18  AMYLASE 37   No results for input(s): AMMONIA in the last 168 hours. Coagulation Profile: No results for input(s): INR, PROTIME in the last 168 hours. Cardiac Enzymes: No results for input(s): CKTOTAL, CKMB, CKMBINDEX, TROPONINI in the last 168 hours. BNP (last 3 results) Recent Labs    06/14/24 2030  PROBNP <50.0   HbA1C: No results for input(s): HGBA1C in the last 72 hours. CBG: Recent Labs  Lab 06/15/24 1128 06/15/24 1452 06/15/24 1836 06/16/24 0819 06/16/24 1245  GLUCAP 89 114* 182* 93 143*   Lipid Profile: No results for input(s): CHOL, HDL, LDLCALC, TRIG, CHOLHDL, LDLDIRECT in the last 72 hours. Thyroid Function Tests: No results for input(s): TSH, T4TOTAL, FREET4, T3FREE, THYROIDAB in the last 72 hours. Anemia Panel: No results for input(s): VITAMINB12, FOLATE, FERRITIN, TIBC, IRON , RETICCTPCT in the last 72 hours. Sepsis Labs: No results for input(s): PROCALCITON, LATICACIDVEN in the last 168 hours.  Recent Results (from the past 240 hours)  SARS Coronavirus 2 by RT PCR (hospital order, performed in [facility] hospital lab) *cepheid single result test* Anterior Nasal Swab     Status: None    Collection Time: 06/15/24 12:19 PM   Specimen: Anterior Nasal Swab  Result Value Ref Range Status   SARS Coronavirus 2 by RT PCR NEGATIVE NEGATIVE Final    Comment: Performed at [facility], [address], [place], [place] [address]  Respiratory (~20 pathogens) panel by PCR     Status: Abnormal   Collection Time: 06/15/24 12:19 PM   Specimen: Nasopharyngeal Swab; Respiratory  Result Value Ref Range Status   Adenovirus NOT DETECTED NOT DETECTED Final   Coronavirus 229E NOT DETECTED NOT DETECTED Final    Comment: (NOTE) The Coronavirus on the Respiratory Panel, DOES NOT test for the novel  Coronavirus (2019 nCoV)    Coronavirus HKU1 NOT DETECTED NOT DETECTED Final   Coronavirus NL63 NOT DETECTED NOT DETECTED Final   Coronavirus OC43 NOT DETECTED NOT DETECTED Final   Metapneumovirus NOT DETECTED NOT DETECTED Final   Rhinovirus / Enterovirus DETECTED (A) NOT DETECTED Final   Influenza A NOT DETECTED NOT DETECTED Final   Influenza B NOT DETECTED NOT DETECTED Final   Parainfluenza Virus 1 NOT DETECTED NOT DETECTED Final   Parainfluenza Virus 2 NOT DETECTED NOT DETECTED  Final   Parainfluenza Virus 3 NOT DETECTED NOT DETECTED Final   Parainfluenza Virus 4 NOT DETECTED NOT DETECTED Final   Respiratory Syncytial Virus NOT DETECTED NOT DETECTED Final   Bordetella pertussis NOT DETECTED NOT DETECTED Final   Bordetella Parapertussis NOT DETECTED NOT DETECTED Final   Chlamydophila pneumoniae NOT DETECTED NOT DETECTED Final   Mycoplasma pneumoniae NOT DETECTED NOT DETECTED Final    Comment: Performed at [facility], [address], [place], [place] [address]         Radiology Studies: US  MFM FETAL BPP WO NON STRESS Result Date: 06/15/2024 ----------------------------------------------------------------------  OBSTETRICS REPORT                        (Signed Final 06/15/2024 10:06 pm) ---------------------------------------------------------------------- Patient Info  ID #:        [id number]                          D.O.B.:  04/06/1986 (38 yrs)(F)  Name:       HARLENE Hinchliffe                  Visit Date: 06/15/2024 07:15 am ---------------------------------------------------------------------- Performed By  Attending:        Nathanel Fetters      Secondary Phy.:    [facility]                    MD                                                              Care  Performed By:     Metta Chang RDMS         [facility]  Referred By:      [facility] MAU/Triage ---------------------------------------------------------------------- Orders  #  Description                           Code        Ordered By  1  US  MFM FETAL BPP WO NON               23180.98    ROLLO BRING     STRESS ----------------------------------------------------------------------  #  Order #                     Accession #                Episode #  1  [id number]                   [id number]                 [id number] ---------------------------------------------------------------------- Indications  Decreased fetal movement  N63.1809  Abdominal pain in pregnancy                     O99.89  Obesity complicating pregnancy, second          O99.212  trimester (BMI 57)  [redacted] weeks gestation of pregnancy                 Z3A.28 ---------------------------------------------------------------------- Fetal Evaluation  Num Of Fetuses:          1  Cardiac Activity:        Observed  Presentation:            Cephalic  Placenta:                Anterior  Amniotic Fluid  AFI FV:      Within normal limits                              Largest Pocket(cm)                              6 ---------------------------------------------------------------------- Biophysical Evaluation  Amniotic F.V:   Pocket => 2 cm             F. Tone:         Observed  F. Movement:    Observed                   Score:           8/8  F. Breathing:   Observed  ---------------------------------------------------------------------- OB History  Gravidity:    9         Term:   0        Prem:   7        SAB:   1  TOP:          0       Ectopic:  0        Living: 5 ---------------------------------------------------------------------- Gestational Age  Best:          28w 1d     Det. By:  Early Ultrasound         EDD:   09/06/24                                      (01/22/24) ---------------------------------------------------------------------- Impression  Antenatal testing performed given decreased fetal movement.  The biophysical profile was 8/8 with good fetal movement and  amniotic fluid volume. ---------------------------------------------------------------------- Recommendations  Clinical correlation recommended. ----------------------------------------------------------------------               Nathanel Fetters, MD Electronically Signed Final Report   06/15/2024 10:06 pm ----------------------------------------------------------------------   US  MFM Fetal BPP Wo Non Stress Result Date: 06/15/2024 ----------------------------------------------------------------------  OBSTETRICS REPORT                    (Corrected Final 06/15/2024 09:37 pm) ---------------------------------------------------------------------- Patient Info  ID #:       [id number]                          D.O.B.:  03/21/1986 (38 yrs)(F)  Name:       Odis Parcher  Visit Date: 06/14/2024 10:17 pm ---------------------------------------------------------------------- Performed By  Attending:        Nathanel Fetters      Referred By:       [facility] MAU/Triage                    MD  Performed By:     Nat GORMAN Plant     [facility]                    BS, RDMS                                  [facility] ---------------------------------------------------------------------- Orders  #  Description                           Code        Ordered By  1  US  MFM FETAL BPP WO  NON               76819.01    LISA LEFTWICH-     STRESS                                            KIRBY ----------------------------------------------------------------------  #  Order #                     Accession #                Episode #  1  [id number]                   [id number]                 [id number] ---------------------------------------------------------------------- Indications  Abdominal pain in pregnancy                     O99.89  [redacted] weeks gestation of pregnancy                 Z3A.28  Decreased fetal movement                        O36.8190  Obesity complicating pregnancy, second          O99.212  trimester (BMI 57) ---------------------------------------------------------------------- Fetal Evaluation  Num Of Fetuses:          1  Fetal Heart Rate(bpm):   133  Cardiac Activity:        Observed  Presentation:            Cephalic  Placenta:                Anterior  P. Cord Insertion:       Previously seen  Amniotic Fluid  AFI FV:      Within normal limits                              Largest Pocket(cm)                              7.6 ---------------------------------------------------------------------- Biophysical Evaluation  Amniotic F.V:  Within normal limits       F. Tone:         Observed  F. Movement:    Observed                   Score:           6/8  F. Breathing:   Not Observed ---------------------------------------------------------------------- OB History  Gravidity:    9         Term:   0        Prem:   7        SAB:   1  TOP:          0       Ectopic:  0        Living: 5 ---------------------------------------------------------------------- Gestational Age  Best:          28w 0d     Det. By:  Early Ultrasound         EDD:   09/06/24                                      (01/22/24) ---------------------------------------------------------------------- Impression  Antenatal testing performed given decreased fetal movement.  The biophysical profile was 6/8 with good fetal movement and   amniotic fluid volume ---------------------------------------------------------------------- Recommendations  Clinical correlation recommended. ----------------------------------------------------------------------                    Nathanel Fetters, MD Electronically Signed Corrected Final Report  06/15/2024 09:37 pm ----------------------------------------------------------------------   CT ABDOMEN PELVIS W CONTRAST Result Date: 06/15/2024 CLINICAL DATA:  Acute abdominal pain.  Known hernia. EXAM: CT ABDOMEN AND PELVIS WITH CONTRAST TECHNIQUE: Multidetector CT imaging of the abdomen and pelvis was performed using the standard protocol following bolus administration of intravenous contrast. RADIATION DOSE REDUCTION: This exam was performed according to the departmental dose-optimization program which includes automated exposure control, adjustment of the mA and/or kV according to patient size and/or use of iterative reconstruction technique. CONTRAST:  OMNIPAQUE  IOHEXOL  350 MG/ML SOLN COMPARISON:  CT 05/29/2024 FINDINGS: Lower chest: Assessed on concurrent chest CT, reported separately. Hepatobiliary: The liver is enlarged spanning 23 cm cranial caudal with mild steatosis. No focal liver lesion. Decompressed gallbladder. No calcified gallstone. No biliary dilatation. Pancreas: No ductal dilatation or inflammation. Spleen: Enlarged, 13.7 cm AP.  No focal abnormality. Adrenals/Urinary Tract: No adrenal nodule. No hydronephrosis. No renal calculi. No evidence of focal renal lesion. Partially distended urinary bladder, normal for degree of distension. Stomach/Bowel: The stomach is distended with ingested material. Large right lower abdominal ventral abdominal wall hernia contains nonobstructed noninflamed small bowel. Hernia size is similar to prior but cannot be accurately assessed. The anterolateral portion of the hernia is not included in the field of view due to habitus. No small bowel wall thickening,  mesenteric inflammation or obstruction. Moderate volume of stool throughout the colon. No colonic inflammation. Vascular/Lymphatic: Normal caliber abdominal aorta. Patent portal, splenic and mesenteric veins. No bulky lymphadenopathy. Reproductive: Gravid uterus. The fetus is in cephalic presentation. No adnexal mass. Other: No free air or free fluid. Generalized body wall edema which is increased from prior exam. Left lower ventral abdominal wall hernia contains only fat. Musculoskeletal: There are no acute or suspicious osseous abnormalities. IMPRESSION: 1. Large right lower abdominal ventral abdominal wall hernia containing nonobstructed noninflamed small bowel. Hernia size is similar to prior but cannot be accurately  assessed, the anterolateral portion of the hernia is not included in the field of view due to habitus. 2. Gravid uterus. The fetus is in cephalic presentation. 3. Hepatosplenomegaly and hepatic steatosis. 4. Generalized body wall edema which is increased from prior exam. Electronically Signed   By: Andrea Gasman M.D.   On: 06/15/2024 18:12   CT Angio Chest Pulmonary Embolism (PE) W or WO Contrast Result Date: 06/15/2024 CLINICAL DATA:  Provided history: Dyspnea, chronic, chest wall or pleura disease suspected EXAM: CT ANGIOGRAPHY CHEST WITH CONTRAST TECHNIQUE: Multidetector CT imaging of the chest was performed using the standard protocol during bolus administration of intravenous contrast. Multiplanar CT image reconstructions and MIPs were obtained to evaluate the vascular anatomy. RADIATION DOSE REDUCTION: This exam was performed according to the departmental dose-optimization program which includes automated exposure control, adjustment of the mA and/or kV according to patient size and/or use of iterative reconstruction technique. CONTRAST:  OMNIPAQUE  IOHEXOL  350 MG/ML SOLN COMPARISON:  Radiograph earlier today.  Chest CTA 05/29/2024 FINDINGS: Cardiovascular: There are no filling  defects within the pulmonary arteries to suggest pulmonary embolus. Normal caliber thoracic aorta without acute aortic findings. The heart is upper normal in size. No pericardial effusion. Mediastinum/Nodes: No mediastinal or hilar lymphadenopathy. Unremarkable appearance of the esophagus. Lungs/Pleura: Mild heterogeneous pulmonary parenchyma. Subsegmental atelectasis in the left lower lobe. No consolidative airspace disease. No features of pulmonary edema. No pleural effusion. The trachea and central airways are clear. Upper Abdomen: Assessed on concurrent abdominopelvic CT, reported separately Musculoskeletal: Degenerative change in the spine. There are no acute or suspicious osseous abnormalities. Soft tissue attenuation from habitus limits detailed assessment. Review of the MIP images confirms the above findings. IMPRESSION: 1. No pulmonary embolus. 2. Mild heterogeneous pulmonary parenchyma, can be seen with small airways disease. 3. Subsegmental atelectasis in the left lower lobe. Electronically Signed   By: Andrea Gasman M.D.   On: 06/15/2024 18:05   DG Chest Portable 1 View Result Date: 06/15/2024 EXAM: 1 VIEW(S) XRAY OF THE CHEST 06/15/2024 06:02:25 AM COMPARISON: 05/27/2024 CLINICAL HISTORY: RUQ pain, recent fall, eval ribs FINDINGS: LUNGS AND PLEURA: Low lung volumes. Bronchovascular crowding versus mild pulmonary edema. No pleural effusion. No pneumothorax. HEART AND MEDIASTINUM: No acute abnormality of the cardiac and mediastinal silhouettes. BONES AND SOFT TISSUES: No acute osseous abnormality. IMPRESSION: 1. Bronchovascular crowding versus mild pulmonary edema. Electronically signed by: Waddell Calk MD 06/15/2024 06:11 AM EST RP Workstation: [id number]   DG Elbow 2 Views Right Result Date: 06/14/2024 EXAM: 1 or 2 VIEW(S) XRAY OF THE RIGHT ELBOW COMPARISON: 07/24/2020 CLINICAL HISTORY: right arm pain, fall FINDINGS: BONES AND JOINTS: Old healed distal humeral diaphyseal fracture partially  visualized. No malalignment. SOFT TISSUES: The soft tissues are unremarkable. IMPRESSION: 1. No acute fracture or dislocation. 2. Old healed distal humeral diaphyseal fracture partially visualized. Electronically signed by: Oneil Devonshire MD 06/14/2024 11:38 PM EST RP Workstation: [id number]   US  Abdomen Limited RUQ (LIVER/GB) Result Date: 06/14/2024 EXAM: Right Upper Quadrant Abdominal Ultrasound 06/14/2024 11:00:06 PM TECHNIQUE: Real-time ultrasonography of the right upper quadrant of the abdomen was performed. COMPARISON: None available. CLINICAL HISTORY: RUQ abdominal pain. FINDINGS: LIVER: Normal echogenicity. No intrahepatic biliary ductal dilatation. No evidence of mass. Patent portal vein with antegrade flow. BILIARY SYSTEM: No pericholecystic fluid or wall thickening. No cholelithiasis. Common bile duct measures 3 mm. Patient very tender to transducer pressure in the right upper quadrant. OTHER: No right upper quadrant ascites. IMPRESSION: 1. Right upper quadrant tenderness with transducer pressure. However, there  is no wall thickening, pericholecystic fluid, or cholelithiasis. No biliary dilation. Electronically signed by: Norman Gatlin MD 06/14/2024 11:08 PM EST RP Workstation: [id number]        Scheduled Meds:  aspirin   162 mg Oral Daily   enoxaparin   100 mg Subcutaneous Q12H   ferrous sulfate   325 mg Oral QODAY   fluticasone  furoate-vilanterol  1 puff Inhalation Daily   furosemide   40 mg Oral BID   guaiFENesin   600 mg Oral BID   insulin  aspart  0-14 Units Subcutaneous TID PC   ipratropium-albuterol   3 mL Nebulization QID   labetalol   200 mg Oral BID   oxyCODONE -acetaminophen   1 tablet Oral Once   predniSONE   40 mg Oral Q breakfast   prenatal multivitamin  1 tablet Oral Q1200   Continuous Infusions:        Derryl Duval, MD [facility] 06/16/2024, 4:02 PM   "

## 2024-06-16 NOTE — Progress Notes (Signed)
 FACULTY PRACTICE ANTEPARTUM(COMPREHENSIVE) NOTE  Mishael Krysiak is a 38 y.o. H0E9284 with Estimated Date of Delivery: 09/06/24   By  early ultrasound [redacted]w[redacted]d  who is admitted for abdominal pain/dyspnea.    Fetal presentation is cephalic. Length of Stay:  0  Days  Date of admission:06/14/2024  Subjective: Pt resting comfortably in bed on 4L [place]- still notes difficulty breathing with movement.  Still notes some mild abdominal pain, but improved from previously.  States she is feeling a little better this am.  No acute OB concerns.    Patient reports the fetal movement as active. Patient reports uterine contraction  activity as none. Patient reports  vaginal bleeding as none. Patient describes fluid per vagina as None.  Vitals:  Blood pressure (!) 99/40, pulse 91, temperature 98.1 F (36.7 C), temperature source Oral, resp. rate 20, height 5' 8 (1.727 m), weight (!) 185.5 kg, SpO2 96%. Vitals:   06/15/24 2356 06/16/24 0322 06/16/24 0501 06/16/24 0822  BP: (!) 133/57  (!) 115/51 (!) 99/40  Pulse: 82  87 91  Resp: (!) 21  (!) 23 20  Temp:   98.1 F (36.7 C) 98.1 F (36.7 C)  TempSrc: Oral  Oral Oral  SpO2: 96% 96% 95% 96%  Weight:      Height:       Physical Examination:  General appearance - alert, well appearing, and in no distress Mental status - normal mood, behavior, speech, dress, motor activity, and thought processes Chest - CTAB Heart - normal rate and regular rhythm Abdomen - morbid obese, edema noted lower abdomen Musculoskeletal - no calf tenderness bilaterally Extremities - 2+ edema with slight brawning Skin - warm and dry   Fetal Monitoring:  to be completed today Reassuring on 12/21  Labs:  Results for orders placed or performed during the hospital encounter of 06/14/24 (from the past 24 hours)  Glucose, capillary   Collection Time: 06/15/24 11:28 AM  Result Value Ref Range   Glucose-Capillary 89 70 - 99 mg/dL  SARS Coronavirus 2 by RT PCR (hospital order,  performed in [facility] hospital lab) *cepheid single result test* Anterior Nasal Swab   Collection Time: 06/15/24 12:19 PM   Specimen: Anterior Nasal Swab  Result Value Ref Range   SARS Coronavirus 2 by RT PCR NEGATIVE NEGATIVE  Respiratory (~20 pathogens) panel by PCR   Collection Time: 06/15/24 12:19 PM   Specimen: Nasopharyngeal Swab; Respiratory  Result Value Ref Range   Adenovirus NOT DETECTED NOT DETECTED   Coronavirus 229E NOT DETECTED NOT DETECTED   Coronavirus HKU1 NOT DETECTED NOT DETECTED   Coronavirus NL63 NOT DETECTED NOT DETECTED   Coronavirus OC43 NOT DETECTED NOT DETECTED   Metapneumovirus NOT DETECTED NOT DETECTED   Rhinovirus / Enterovirus DETECTED (A) NOT DETECTED   Influenza A NOT DETECTED NOT DETECTED   Influenza B NOT DETECTED NOT DETECTED   Parainfluenza Virus 1 NOT DETECTED NOT DETECTED   Parainfluenza Virus 2 NOT DETECTED NOT DETECTED   Parainfluenza Virus 3 NOT DETECTED NOT DETECTED   Parainfluenza Virus 4 NOT DETECTED NOT DETECTED   Respiratory Syncytial Virus NOT DETECTED NOT DETECTED   Bordetella pertussis NOT DETECTED NOT DETECTED   Bordetella Parapertussis NOT DETECTED NOT DETECTED   Chlamydophila pneumoniae NOT DETECTED NOT DETECTED   Mycoplasma pneumoniae NOT DETECTED NOT DETECTED  Glucose, capillary   Collection Time: 06/15/24  2:52 PM  Result Value Ref Range   Glucose-Capillary 114 (H) 70 - 99 mg/dL  Glucose, capillary   Collection Time:  06/15/24  6:36 PM  Result Value Ref Range   Glucose-Capillary 182 (H) 70 - 99 mg/dL  Comprehensive metabolic panel   Collection Time: 06/16/24  4:59 AM  Result Value Ref Range   Sodium 134 (L) 135 - 145 mmol/L   Potassium 4.4 3.5 - 5.1 mmol/L   Chloride 99 98 - 111 mmol/L   CO2 26 22 - 32 mmol/L   Glucose, Bld 131 (H) 70 - 99 mg/dL   BUN 14 6 - 20 mg/dL   Creatinine, Ser 9.29 0.44 - 1.00 mg/dL   Calcium  8.9 8.9 - 10.3 mg/dL   Total Protein 6.2 (L) 6.5 - 8.1 g/dL   Albumin 3.1 (L) 3.5 - 5.0 g/dL    AST 18 15 - 41 U/L   ALT 12 0 - 44 U/L   Alkaline Phosphatase 75 38 - 126 U/L   Total Bilirubin <0.2 0.0 - 1.2 mg/dL   GFR, Estimated >39 >39 mL/min   Anion gap 9 5 - 15  CBC   Collection Time: 06/16/24  4:59 AM  Result Value Ref Range   WBC 10.0 4.0 - 10.5 K/uL   RBC 3.60 (L) 3.87 - 5.11 MIL/uL   Hemoglobin 9.8 (L) 12.0 - 15.0 g/dL   HCT 69.3 (L) 63.9 - 53.9 %   MCV 85.0 80.0 - 100.0 fL   MCH 27.2 26.0 - 34.0 pg   MCHC 32.0 30.0 - 36.0 g/dL   RDW 82.1 (H) 88.4 - 84.4 %   Platelets 217 150 - 400 K/uL   nRBC 0.3 (H) 0.0 - 0.2 %  Glucose, capillary   Collection Time: 06/16/24  8:19 AM  Result Value Ref Range   Glucose-Capillary 93 70 - 99 mg/dL    Imaging Studies:    US  MFM FETAL BPP WO NON STRESS Result Date: 06/15/2024 ----------------------------------------------------------------------  OBSTETRICS REPORT                        (Signed Final 06/15/2024 10:06 pm) ---------------------------------------------------------------------- Patient Info  ID #:       [id number]                          D.O.B.:  11-05-1985 (38 yrs)(F)  Name:       HARLENE Lepage                  Visit Date: 06/15/2024 07:15 am ---------------------------------------------------------------------- Performed By  Attending:        Nathanel Fetters      Secondary Phy.:    [facility]                    MD                                                              Care  Performed By:     Metta Chang RDMS         [facility]  Referred By:      [facility] MAU/Triage ---------------------------------------------------------------------- Orders  #  Description                           Code        Ordered By  1  US  MFM FETAL BPP WO NON               76819.01    KATHLEEN DUNN     STRESS ----------------------------------------------------------------------  #  Order #                     Accession #                Episode #  1   [id number]                   [id number]                 [id number] ---------------------------------------------------------------------- Indications  Decreased fetal movement                        O36.8190  Abdominal pain in pregnancy                     O99.89  Obesity complicating pregnancy, second          O99.212  trimester (BMI 57)  [redacted] weeks gestation of pregnancy                 Z3A.28 ---------------------------------------------------------------------- Fetal Evaluation  Num Of Fetuses:          1  Cardiac Activity:        Observed  Presentation:            Cephalic  Placenta:                Anterior  Amniotic Fluid  AFI FV:      Within normal limits                              Largest Pocket(cm)                              6 ---------------------------------------------------------------------- Biophysical Evaluation  Amniotic F.V:   Pocket => 2 cm             F. Tone:         Observed  F. Movement:    Observed                   Score:           8/8  F. Breathing:   Observed ---------------------------------------------------------------------- OB History  Gravidity:    9         Term:   0        Prem:   7        SAB:   1  TOP:          0       Ectopic:  0        Living: 5 ---------------------------------------------------------------------- Gestational Age  Best:          28w 1d     Det. By:  Early Ultrasound         EDD:   09/06/24                                      (  01/22/24) ---------------------------------------------------------------------- Impression  Antenatal testing performed given decreased fetal movement.  The biophysical profile was 8/8 with good fetal movement and  amniotic fluid volume. ---------------------------------------------------------------------- Recommendations  Clinical correlation recommended. ----------------------------------------------------------------------               Nathanel Fetters, MD Electronically Signed Final Report   06/15/2024 10:06 pm  ----------------------------------------------------------------------   US  MFM Fetal BPP Wo Non Stress Result Date: 06/15/2024 ----------------------------------------------------------------------  OBSTETRICS REPORT                    (Corrected Final 06/15/2024 09:37 pm) ---------------------------------------------------------------------- Patient Info  ID #:       [id number]                          D.O.B.:  1986-04-06 (38 yrs)(F)  Name:       HARLENE Peter                  Visit Date: 06/14/2024 10:17 pm ---------------------------------------------------------------------- Performed By  Attending:        Nathanel Fetters      Referred By:       [facility] MAU/Triage                    MD  Performed By:     Nat GORMAN Plant     [facility], RDMS                                  [facility] ---------------------------------------------------------------------- Orders  #  Description                           Code        Ordered By  1  US  MFM FETAL BPP WO NON               76819.01    LISA LEFTWICH-     STRESS                                            KIRBY ----------------------------------------------------------------------  #  Order #                     Accession #                Episode #  1  [id number]                   [id number]                 [id number] ---------------------------------------------------------------------- Indications  Abdominal pain in pregnancy                     O99.89  [redacted] weeks gestation of pregnancy                 Z3A.28  Decreased fetal movement                        O36.8190  Obesity complicating pregnancy, second  N00.787  trimester (BMI 57) ---------------------------------------------------------------------- Fetal Evaluation  Num Of Fetuses:          1  Fetal Heart Rate(bpm):   133  Cardiac Activity:        Observed  Presentation:            Cephalic  Placenta:                Anterior  P. Cord Insertion:        Previously seen  Amniotic Fluid  AFI FV:      Within normal limits                              Largest Pocket(cm)                              7.6 ---------------------------------------------------------------------- Biophysical Evaluation  Amniotic F.V:   Within normal limits       F. Tone:         Observed  F. Movement:    Observed                   Score:           6/8  F. Breathing:   Not Observed ---------------------------------------------------------------------- OB History  Gravidity:    9         Term:   0        Prem:   7        SAB:   1  TOP:          0       Ectopic:  0        Living: 5 ---------------------------------------------------------------------- Gestational Age  Best:          28w 0d     Det. By:  Early Ultrasound         EDD:   09/06/24                                      (01/22/24) ---------------------------------------------------------------------- Impression  Antenatal testing performed given decreased fetal movement.  The biophysical profile was 6/8 with good fetal movement and  amniotic fluid volume ---------------------------------------------------------------------- Recommendations  Clinical correlation recommended. ----------------------------------------------------------------------                    Nathanel Fetters, MD Electronically Signed Corrected Final Report  06/15/2024 09:37 pm ----------------------------------------------------------------------   CT ABDOMEN PELVIS W CONTRAST Result Date: 06/15/2024 CLINICAL DATA:  Acute abdominal pain.  Known hernia. EXAM: CT ABDOMEN AND PELVIS WITH CONTRAST TECHNIQUE: Multidetector CT imaging of the abdomen and pelvis was performed using the standard protocol following bolus administration of intravenous contrast. RADIATION DOSE REDUCTION: This exam was performed according to the departmental dose-optimization program which includes automated exposure control, adjustment of the mA and/or kV according to patient size and/or  use of iterative reconstruction technique. CONTRAST:  OMNIPAQUE  IOHEXOL  350 MG/ML SOLN COMPARISON:  CT 05/29/2024 FINDINGS: Lower chest: Assessed on concurrent chest CT, reported separately. Hepatobiliary: The liver is enlarged spanning 23 cm cranial caudal with mild steatosis. No focal liver lesion. Decompressed gallbladder. No calcified gallstone. No biliary dilatation. Pancreas: No ductal dilatation or inflammation. Spleen: Enlarged, 13.7 cm AP.  No focal abnormality. Adrenals/Urinary Tract: No adrenal nodule. No hydronephrosis. No renal calculi. No evidence  of focal renal lesion. Partially distended urinary bladder, normal for degree of distension. Stomach/Bowel: The stomach is distended with ingested material. Large right lower abdominal ventral abdominal wall hernia contains nonobstructed noninflamed small bowel. Hernia size is similar to prior but cannot be accurately assessed. The anterolateral portion of the hernia is not included in the field of view due to habitus. No small bowel wall thickening, mesenteric inflammation or obstruction. Moderate volume of stool throughout the colon. No colonic inflammation. Vascular/Lymphatic: Normal caliber abdominal aorta. Patent portal, splenic and mesenteric veins. No bulky lymphadenopathy. Reproductive: Gravid uterus. The fetus is in cephalic presentation. No adnexal mass. Other: No free air or free fluid. Generalized body wall edema which is increased from prior exam. Left lower ventral abdominal wall hernia contains only fat. Musculoskeletal: There are no acute or suspicious osseous abnormalities. IMPRESSION: 1. Large right lower abdominal ventral abdominal wall hernia containing nonobstructed noninflamed small bowel. Hernia size is similar to prior but cannot be accurately assessed, the anterolateral portion of the hernia is not included in the field of view due to habitus. 2. Gravid uterus. The fetus is in cephalic presentation. 3. Hepatosplenomegaly and  hepatic steatosis. 4. Generalized body wall edema which is increased from prior exam. Electronically Signed   By: Andrea Gasman M.D.   On: 06/15/2024 18:12   CT Angio Chest Pulmonary Embolism (PE) W or WO Contrast Result Date: 06/15/2024 CLINICAL DATA:  Provided history: Dyspnea, chronic, chest wall or pleura disease suspected EXAM: CT ANGIOGRAPHY CHEST WITH CONTRAST TECHNIQUE: Multidetector CT imaging of the chest was performed using the standard protocol during bolus administration of intravenous contrast. Multiplanar CT image reconstructions and MIPs were obtained to evaluate the vascular anatomy. RADIATION DOSE REDUCTION: This exam was performed according to the departmental dose-optimization program which includes automated exposure control, adjustment of the mA and/or kV according to patient size and/or use of iterative reconstruction technique. CONTRAST:  OMNIPAQUE  IOHEXOL  350 MG/ML SOLN COMPARISON:  Radiograph earlier today.  Chest CTA 05/29/2024 FINDINGS: Cardiovascular: There are no filling defects within the pulmonary arteries to suggest pulmonary embolus. Normal caliber thoracic aorta without acute aortic findings. The heart is upper normal in size. No pericardial effusion. Mediastinum/Nodes: No mediastinal or hilar lymphadenopathy. Unremarkable appearance of the esophagus. Lungs/Pleura: Mild heterogeneous pulmonary parenchyma. Subsegmental atelectasis in the left lower lobe. No consolidative airspace disease. No features of pulmonary edema. No pleural effusion. The trachea and central airways are clear. Upper Abdomen: Assessed on concurrent abdominopelvic CT, reported separately Musculoskeletal: Degenerative change in the spine. There are no acute or suspicious osseous abnormalities. Soft tissue attenuation from habitus limits detailed assessment. Review of the MIP images confirms the above findings. IMPRESSION: 1. No pulmonary embolus. 2. Mild heterogeneous pulmonary parenchyma, can be  seen with small airways disease. 3. Subsegmental atelectasis in the left lower lobe. Electronically Signed   By: Andrea Gasman M.D.   On: 06/15/2024 18:05   DG Chest Portable 1 View Result Date: 06/15/2024 EXAM: 1 VIEW(S) XRAY OF THE CHEST 06/15/2024 06:02:25 AM COMPARISON: 05/27/2024 CLINICAL HISTORY: RUQ pain, recent fall, eval ribs FINDINGS: LUNGS AND PLEURA: Low lung volumes. Bronchovascular crowding versus mild pulmonary edema. No pleural effusion. No pneumothorax. HEART AND MEDIASTINUM: No acute abnormality of the cardiac and mediastinal silhouettes. BONES AND SOFT TISSUES: No acute osseous abnormality. IMPRESSION: 1. Bronchovascular crowding versus mild pulmonary edema. Electronically signed by: Waddell Calk MD 06/15/2024 06:11 AM EST RP Workstation: [id number]   DG Elbow 2 Views Right Result Date: 06/14/2024 EXAM: 1 or 2  VIEW(S) XRAY OF THE RIGHT ELBOW COMPARISON: 07/24/2020 CLINICAL HISTORY: right arm pain, fall FINDINGS: BONES AND JOINTS: Old healed distal humeral diaphyseal fracture partially visualized. No malalignment. SOFT TISSUES: The soft tissues are unremarkable. IMPRESSION: 1. No acute fracture or dislocation. 2. Old healed distal humeral diaphyseal fracture partially visualized. Electronically signed by: Oneil Devonshire MD 06/14/2024 11:38 PM EST RP Workstation: [id number]   US  Abdomen Limited RUQ (LIVER/GB) Result Date: 06/14/2024 EXAM: Right Upper Quadrant Abdominal Ultrasound 06/14/2024 11:00:06 PM TECHNIQUE: Real-time ultrasonography of the right upper quadrant of the abdomen was performed. COMPARISON: None available. CLINICAL HISTORY: RUQ abdominal pain. FINDINGS: LIVER: Normal echogenicity. No intrahepatic biliary ductal dilatation. No evidence of mass. Patent portal vein with antegrade flow. BILIARY SYSTEM: No pericholecystic fluid or wall thickening. No cholelithiasis. Common bile duct measures 3 mm. Patient very tender to transducer pressure in the right upper quadrant. OTHER:  No right upper quadrant ascites. IMPRESSION: 1. Right upper quadrant tenderness with transducer pressure. However, there is no wall thickening, pericholecystic fluid, or cholelithiasis. No biliary dilation. Electronically signed by: Norman Gatlin MD 06/14/2024 11:08 PM EST RP Workstation: [id number]      ASSESSMENT: H0E9284 [redacted]w[redacted]d Estimated Date of Delivery: 09/06/24  Patient Active Problem List   Diagnosis Date Noted   Diet controlled gestational diabetes mellitus (GDM) in third trimester 06/16/2024   Abdominal pain 06/15/2024   Hernia of abdominal cavity 06/15/2024   Decreased fetal movements in third trimester 06/15/2024   Rhinovirus infection 06/15/2024   Unwanted fertility 06/13/2024   Irreducible Spigelian hernia 05/28/2024   Pelvic adhesions 05/28/2024   Acute congestive heart failure (HCC) 05/27/2024   Hepatic steatosis during pregnancy 03/28/2024   Low fetal fraction on NIPS 03/20/2024   Advanced maternal age in multigravida 03/20/2024   RED chart patient 02/21/2024   At risk for injury related to surgical procedure, history of bowel injury 02/21/2024   Placenta positioned low in anterior wall of uterus 02/21/2024   Supervision of high risk pregnancy, antepartum 02/06/2024   History of congestive heart failure 01/22/2024   history of 5 c sections 12/27/2021   Ventral hernia without obstruction or gangrene 12/27/2021   History of substance abuse (HCC) 03/03/2015   History of gestational diabetes 02/16/2015   Asthma affecting pregnancy, antepartum 12/09/2014   Chronic hypertension during pregnancy, antepartum 12/09/2014   Anxiety 10/08/2014   Previous cesarean delivery affecting pregnancy, antepartum 10/08/2014   Hx of pulmonary embolus during pregnancy 10/08/2014   Protein S deficiency affecting pregnancy, antepartum 10/08/2014   BMI 50.0-59.9, adult (HCC) 08/28/2014   History of pre-eclampsia in prior pregnancy, currently pregnant in first trimester 08/28/2014    Principal problem: -Acute dyspnea on [place] - Rhinovirus - Abdominal pain  Secondary concerns: - Intrauterine pregnancy at 28W 2D - History of congestive heart failure - Morbid obesity - History of prior C-sections x 5 with complications - Ventral hernia without obstruction - Chronic hypertension - History of pulmonary embolism during pregnancy - Gestational diabetes   PLAN: 1) Dyspnea - Currently on 4 L [place], with plan to wean as tolerated - Appreciate management per medicine team - Orders include steroid taper, DuoNebs, IV Lasix  x 1 and Mucinex  - Follow-up CT angiogram  2) FWB - Continue daily monitoring -BPP 8/8 on 12/21  3) Maternal care -continue home medications including Lovenox , Lasix , and labetalol  - Encourage ambulation as tolerated - Routine antenatal care  DISP: Continue inpatient management until patient is able to wean off [place]  Delon HERO Damare Serano 06/16/2024,9:06 AM

## 2024-06-16 NOTE — Hospital Course (Signed)
 Brooke Sandoval

## 2024-06-17 ENCOUNTER — Other Ambulatory Visit (HOSPITAL_COMMUNITY): Payer: Self-pay

## 2024-06-17 ENCOUNTER — Telehealth (HOSPITAL_COMMUNITY): Payer: Self-pay

## 2024-06-17 ENCOUNTER — Other Ambulatory Visit: Payer: Self-pay | Admitting: Family Medicine

## 2024-06-17 DIAGNOSIS — K469 Unspecified abdominal hernia without obstruction or gangrene: Secondary | ICD-10-CM | POA: Diagnosis not present

## 2024-06-17 DIAGNOSIS — J45909 Unspecified asthma, uncomplicated: Secondary | ICD-10-CM

## 2024-06-17 DIAGNOSIS — B348 Other viral infections of unspecified site: Secondary | ICD-10-CM | POA: Diagnosis not present

## 2024-06-17 DIAGNOSIS — R0609 Other forms of dyspnea: Secondary | ICD-10-CM

## 2024-06-17 DIAGNOSIS — K439 Ventral hernia without obstruction or gangrene: Secondary | ICD-10-CM

## 2024-06-17 DIAGNOSIS — Z6841 Body Mass Index (BMI) 40.0 and over, adult: Secondary | ICD-10-CM

## 2024-06-17 DIAGNOSIS — J45901 Unspecified asthma with (acute) exacerbation: Secondary | ICD-10-CM

## 2024-06-17 DIAGNOSIS — Z86711 Personal history of pulmonary embolism: Secondary | ICD-10-CM

## 2024-06-17 DIAGNOSIS — R1013 Epigastric pain: Secondary | ICD-10-CM | POA: Diagnosis not present

## 2024-06-17 LAB — CBC WITH DIFFERENTIAL/PLATELET
Abs Immature Granulocytes: 0.24 K/uL — ABNORMAL HIGH (ref 0.00–0.07)
Basophils Absolute: 0 K/uL (ref 0.0–0.1)
Basophils Relative: 0 %
Eosinophils Absolute: 0.1 K/uL (ref 0.0–0.5)
Eosinophils Relative: 1 %
HCT: 30.2 % — ABNORMAL LOW (ref 36.0–46.0)
Hemoglobin: 9.5 g/dL — ABNORMAL LOW (ref 12.0–15.0)
Immature Granulocytes: 2 %
Lymphocytes Relative: 17 %
Lymphs Abs: 2 K/uL (ref 0.7–4.0)
MCH: 28 pg (ref 26.0–34.0)
MCHC: 31.5 g/dL (ref 30.0–36.0)
MCV: 89.1 fL (ref 80.0–100.0)
Monocytes Absolute: 0.5 K/uL (ref 0.1–1.0)
Monocytes Relative: 4 %
Neutro Abs: 9.1 K/uL — ABNORMAL HIGH (ref 1.7–7.7)
Neutrophils Relative %: 76 %
Platelets: 202 K/uL (ref 150–400)
RBC: 3.39 MIL/uL — ABNORMAL LOW (ref 3.87–5.11)
RDW: 17.9 % — ABNORMAL HIGH (ref 11.5–15.5)
WBC: 12 K/uL — ABNORMAL HIGH (ref 4.0–10.5)
nRBC: 0.2 % (ref 0.0–0.2)

## 2024-06-17 LAB — GLUCOSE, CAPILLARY
Glucose-Capillary: 128 mg/dL — ABNORMAL HIGH (ref 70–99)
Glucose-Capillary: 99 mg/dL (ref 70–99)
Glucose-Capillary: 151 mg/dL — ABNORMAL HIGH (ref 70–99)
Glucose-Capillary: 195 mg/dL — ABNORMAL HIGH (ref 70–99)

## 2024-06-17 LAB — BASIC METABOLIC PANEL WITH GFR
Anion gap: 9 (ref 5–15)
BUN: 16 mg/dL (ref 6–20)
CO2: 28 mmol/L (ref 22–32)
Calcium: 8.9 mg/dL (ref 8.9–10.3)
Chloride: 98 mmol/L (ref 98–111)
Creatinine, Ser: 0.77 mg/dL (ref 0.44–1.00)
GFR, Estimated: >60 mL/min
Glucose, Bld: 124 mg/dL — ABNORMAL HIGH (ref 70–99)
Potassium: 3.9 mmol/L (ref 3.5–5.1)
Sodium: 135 mmol/L (ref 135–145)

## 2024-06-17 MED ORDER — FUROSEMIDE 10 MG/ML IJ SOLN
40.0000 mg | Freq: Two times a day (BID) | INTRAMUSCULAR | Status: DC
  Administered 2024-06-17 – 2024-06-18 (×2): 40 mg via INTRAVENOUS
  Filled 2024-06-17 (×2): qty 4

## 2024-06-17 MED ORDER — BLOOD GLUCOSE TEST VI STRP: 1.0000 | ORAL_STRIP | 0 refills | Status: AC

## 2024-06-17 MED ORDER — LANCET DEVICE MISC: 1.0000 | 0 refills | Status: AC

## 2024-06-17 MED ORDER — BLOOD GLUCOSE MONITORING SUPPL DEVI: 1.0000 | 0 refills | Status: AC

## 2024-06-17 MED ORDER — LANCETS MISC: 1.0000 | 0 refills | Status: AC

## 2024-06-17 MED ORDER — FREESTYLE LIBRE 3 PLUS SENSOR MISC: 3 refills | Status: AC

## 2024-06-17 MED ORDER — FUROSEMIDE 10 MG/ML IJ SOLN
40.0000 mg | Freq: Once | INTRAMUSCULAR | Status: AC
  Administered 2024-06-17: 40 mg via INTRAVENOUS
  Filled 2024-06-17: qty 4

## 2024-06-17 NOTE — Plan of Care (Addendum)
 Nutrition Education Note  RD consulted for nutrition education regarding diet for GDM.  Lab Results  Component Value Date   HGBA1C 5.6 02/21/2024    Marthe Dant is a 38 y.o. H0E9284 at [redacted]w[redacted]d IUP with PMHx of asthma, diastolic CHF, protein S deficiency, DVT/pulmonary embolism, anxiety, morbid obesity, GDM admitted with shortness of breath, upper abdominal pain, right arm cramping after a fall found to have diastolic congestive heart failure exacerbation and/or asthma exacerbation also complicated by rhino/enterovirus and large ventral hernia. Noted plan for pt to be set up on home oxygen.  Met with pt at bedside. Of note, pt very tired during assessment and education and fell asleep multiple times during RD visit. She reports she had GDM once before in a prior pregnancy but she delivered shortly after so she did not have long to learn about diet for GDM. She reports she is learning about carbohydrates now and has already identified changes she would like to make. She reports she has a good appetite and intake at baseline. Typically eats 2 meals per day. For breakfast she has a granola bar or Fruity Pebbles or Cocoa Pebbles with milk. For dinner she has chicken tenders or hamburger. Limited intake of fruits and vegetables. Reports drinking lemonade, water, soda (Dr. Nunzio), and juice. She reports food allergies to fish and mustard, which were not yet entered in chart so RD entered as allergies. Reports nausea and emesis in first trimester but reports that has improved now. Reports difficulty with constipation throughout pregnancy (on bowel regimen). Reports taking prenatal multivitamin and iron  supplement daily. Pt with significant edema on assessment today. Mild pitting edema to bilateral upper extremities and severe pitting edema to bilateral lower extremities. Pt on IV Lasix  today and reports she takes PO Lasix  daily at home. Noted wt of 179.7 kg on 09/13/23. Current wt 185.5 kg, may be impacted by by  edema.  RD provided My Food Plan for Gestational Diabetes handout from [facility]. Discussed different food groups and their effects on blood sugar, emphasizing carbohydrate-containing foods. Provided list of carbohydrates and recommended serving sizes of common foods.  Discussed importance of controlled and consistent carbohydrate intake throughout the day. Provided examples of ways to balance meals/snacks and encouraged intake of high-fiber, whole grain complex carbohydrates. Encouraged intake of adequate protein at meals and snacks. Teach back method used.  Recommended Nutrition Prescription for GDM: Breakfast: 30-45 grams carbohydrates Morning Snack: 15-30 grams carbohydrates Lunch: 50-60 grams carbohydrates Afternoon Snack: 15-30 grams carbohydrates Dinner: 50-60 grams of carbohydrates Evening Snack: 15-30 grams of carbohydrates  Expect fair compliance.  Current diet order is gestational carb modified. She reports good appetite and intake at this time. Labs and medications reviewed. No further nutrition interventions warranted at this time. If additional nutrition issues arise, please re-consult RD.  Christon Parada King Deloyce Walthers, MS, RD, LDN, CNSC If unable to reach, please contact NICU RD secure chat group between 7 am-3 pm daily

## 2024-06-17 NOTE — Progress Notes (Addendum)
 Transition of Care [facility]) - Inpatient Brief Assessment   Patient Details  Name: Brooke Sandoval MRN: [id number] Date of Birth: [birth year]  Transition of Care [facility]) CM/SW Contact:    Rosaline JONELLE Joe, RN Phone Number: 06/17/2024, 12:33 PM   Clinical Narrative: CM met with the patient at the bedside and patient will likely be discharged back home tomorrow - pending medical stability per MD.  Patient lives at home with her children ages, 33, 73, 83, 78 and 66 years old.  Patient states that she plans to return home tomorrow.  Patient does not drive and will need transportation assistance to home.  Taxi voucher given to bedside nurse to attach to hard chart for discharge tomorrow.  Patient has nebulizer machine at home.  Patient's oxygen drops at nighttime and will need walk test today and MD note stating patient's need for nighttime oxygen.  I reached out to [employer] and home oxygen will be ordered today - pending walk test, proper documentation.  Patient is aware.  Patient states that she will need a shower chair for home as well.  [employer] will be updated.  Patient states that she does not smoke.  Patient remain on room air at this time.  Patient states that she does not have a PCP.  I placed follow up in the AVS for patient to call and schedule since patient will have OB/Gyn appointment follow up as well.  Patient will discharge home tomorrow via taxi.  Resources provided in the AVS.  06/17/24 1449 - [employer] was called and requested to deliver a portable oxygen tank to the hospital room.  The oxygen concentrator and shower chair will be delivered to the patient's home tomorrow when she arrives home.  Patient is aware that taxi voucher is at nursing station to nurse to arrange transportation tomorrow.    Transition of Care Asessment: Insurance and Status: (P) Insurance coverage has been reviewed Patient has primary care physician: (P) No (PCP placed in AVs for PCP follow up) Home  environment has been reviewed: (P) from home with children Prior level of function:: (P) self Prior/Current Home Services: (P) No current home services Social Drivers of Health Review: (P) SDOH reviewed needs interventions Readmission risk has been reviewed: (P) Yes Transition of care needs: (P) transition of care needs identified, TOC will continue to follow

## 2024-06-17 NOTE — Plan of Care (Signed)
  Problem: Education: Goal: Ability to describe self-care measures that may prevent or decrease complications (Diabetes Survival Skills Education) will improve Outcome: Progressing Goal: Individualized Educational Video(s) Outcome: Progressing   Problem: Coping: Goal: Ability to adjust to condition or change in health will improve Outcome: Progressing   Problem: Fluid Volume: Goal: Ability to maintain a balanced intake and output will improve Outcome: Progressing   Problem: Health Behavior/Discharge Planning: Goal: Ability to identify and utilize available resources and services will improve Outcome: Progressing Goal: Ability to manage health-related needs will improve Outcome: Progressing   Problem: Metabolic: Goal: Ability to maintain appropriate glucose levels will improve Outcome: Progressing   Problem: Nutritional: Goal: Maintenance of adequate nutrition will improve Outcome: Progressing Goal: Progress toward achieving an optimal weight will improve Outcome: Progressing   Problem: Skin Integrity: Goal: Risk for impaired skin integrity will decrease Outcome: Progressing   Problem: Tissue Perfusion: Goal: Adequacy of tissue perfusion will improve Outcome: Progressing   Problem: Education: Goal: Knowledge of disease or condition will improve Outcome: Progressing Goal: Knowledge of the prescribed therapeutic regimen will improve Outcome: Progressing Goal: Individualized Educational Video(s) Outcome: Progressing   Problem: Clinical Measurements: Goal: Complications related to the disease process, condition or treatment will be avoided or minimized Outcome: Progressing   Problem: Education: Goal: Knowledge of General Education information will improve Description: Including pain rating scale, medication(s)/side effects and non-pharmacologic comfort measures Outcome: Progressing   Problem: Health Behavior/Discharge Planning: Goal: Ability to manage health-related  needs will improve Outcome: Progressing   Problem: Clinical Measurements: Goal: Ability to maintain clinical measurements within normal limits will improve Outcome: Progressing Goal: Will remain free from infection Outcome: Progressing Goal: Diagnostic test results will improve Outcome: Progressing Goal: Respiratory complications will improve Outcome: Progressing Goal: Cardiovascular complication will be avoided Outcome: Progressing   Problem: Activity: Goal: Risk for activity intolerance will decrease Outcome: Progressing   Problem: Nutrition: Goal: Adequate nutrition will be maintained Outcome: Progressing   Problem: Coping: Goal: Level of anxiety will decrease Outcome: Progressing   Problem: Elimination: Goal: Will not experience complications related to bowel motility Outcome: Progressing Goal: Will not experience complications related to urinary retention Outcome: Progressing   Problem: Pain Managment: Goal: General experience of comfort will improve and/or be controlled Outcome: Progressing   Problem: Safety: Goal: Ability to remain free from injury will improve Outcome: Progressing   Problem: Skin Integrity: Goal: Risk for impaired skin integrity will decrease Outcome: Progressing

## 2024-06-17 NOTE — Progress Notes (Signed)
 " PROGRESS NOTE    Brooke Sandoval  FMW:[id number] DOB: 25-Jan-1986 DOA: 06/14/2024 PCP: Patient, No Pcp Per   Brief Narrative:     Brooke Sandoval is a 38 y.o. female with past medical history of G9P0715 asthma, diastolic CHF, protein S deficiency, DVT/pulmonary embolism, anxiety, and morbid obesity  presents with shortness of breath, upper abdominal pain, and right arm cramping after having a fall yesterday.   She experiences persistent wheezing and coughing. The patient reports difficulty breathing, especially when lying down or trying to sleep. She has been taking Lasix  as prescribed and denies missing any doses recently. She describes a sensation of 'drowning' when drinking.   Labs obtained yesterday noted WBC 11.7, hemoglobin 10.8, proBNP less than 50, and high-sensitivity troponin 15.  Chest x-ray noted bronchial vascular crowding versus mild pulmonary edema.  Currently on therapeutic Lovenox .  She is currently using 4 L/min of supplemental oxygen, initiated on scheduled nebulizers as well as prednisone  therapy.  Tested positive for rhinovirus/enterovirus.  Hospitalist consulted in regards to acute respiratory failure.   Assessment & Plan:   Principal Problem:   Abdominal pain Active Problems:   Hernia of abdominal cavity   Decreased fetal movements in third trimester   Rhinovirus infection   Intrauterine pregnancy - Per OB/GYN   Acute respiratory failure with hypoxia Diastolic congestive heart failure exacerbation and/or asthma exacerbation versus pulmonary embolism versus other Rhinovirus positive  -Multifactorial with chronic diastolic heart failure, obesity, pregnancy status send positive for rhinovirus/enterovirus.  Also having large ventral hernia with some pain likely causing some breathing issues. -CT angiogram chest negative for PE.  Heterogeneous lung parenchyma, likely small airway disease. -Required up to 4 L/min supplemental oxygen.  Had a walking oximetry today.   She is satting 93 to 95% on room air while walking.  At rest she is anywhere between 87 to 92%. -Will set up home oxygen 2 L/min continuous. -Patient likely has obstructive sleep apnea and will need formal sleep study outpatient -Will continue scheduled nebulizer, also initiated on Breo Ellipta  - Currently Lasix  40 mg twice daily which is her home dose.  Patient states p.o. Lasix  has not been working well.  She does have persistent lower extremity edema.  We will give her IV Lasix  twice daily today.  Will check BMP, magnesium  in the a.m. -Continue prednisone  40 mg daily for now, has some diminished air entry bilaterally, no wheezing  - DuoNebs 4 times daily and as needed - Mucinex    History of DVT/pulmonary embolism Protein S deficiency - CT angiogram chest is negative for PE - Continue  Lovenox   Leukocytosis likely from steroids     Hiatal hernia - CT abdomen pelvis shows ventral hernia, no complications.  Generalized body edema worse than before   TRH will continue to follow the patient.   DVT prophylaxis: lovenox  Code Status:  full Family Communication: none Disposition Plan: Status is: Inpatient Remains inpatient appropriate because: Acute hypoxic respiratory failure/ongoing pain  Subjective:  Patient seen and examined at the bedside.  Bilateral lower extremity edema is persistent.  She does report of some redness in the feet especially on the left side.  This is likely secondary to increasing edema.  Has ongoing abdominal discomfort issues, no chest pain   objective: Vitals:   06/17/24 1320 06/17/24 1321 06/17/24 1324 06/17/24 1354  BP:      Pulse: 76  93   Resp:      Temp:      TempSrc:  SpO2: (!) 87% 93% 95% 97%  Weight:      Height:        Intake/Output Summary (Last 24 hours) at 06/17/2024 1448 Last data filed at 06/17/2024 1152 Gross per 24 hour  Intake --  Output 1750 ml  Net -1750 ml   Filed Weights   06/14/24 1807  Weight: (!) 185.5 kg     Examination:  General: Alert, oriented not in any acute distress Chest: Mildly diminished bilaterally, no wheezing or crackles heard CVS: S1, S2, no murmur, regular rhythm Abdomen: Distended, nontender to palpation, large ventral hernia Extremities: Bilateral pitting edema  Data Reviewed: I have personally reviewed following labs and imaging studies  CBC: Recent Labs  Lab 06/13/24 0815 06/14/24 2030 06/16/24 0459 06/17/24 0451  WBC 9.5 11.7* 10.0 12.0*  NEUTROABS  --   --   --  9.1*  HGB 10.3* 10.8* 9.8* 9.5*  HCT 33.9* 33.6* 30.6* 30.2*  MCV 88 84.2 85.0 89.1  PLT 220 227 217 202   Basic Metabolic Panel: Recent Labs  Lab 06/13/24 1052 06/14/24 2030 06/16/24 0459 06/17/24 0451  NA 137 135 134* 135  K 4.3 4.8 4.4 3.9  CL 99 102 99 98  CO2 23 25 26 28   GLUCOSE 165* 100* 131* 124*  BUN 6 8 14 16   CREATININE 0.64 0.57 0.70 0.77  CALCIUM  8.6* 9.1 8.9 8.9   GFR: Estimated Creatinine Clearance: 166 mL/min (by C-G formula based on SCr of 0.77 mg/dL). Liver Function Tests: Recent Labs  Lab 06/13/24 1052 06/14/24 2030 06/16/24 0459  AST 11 17 18   ALT 10 9 12   ALKPHOS 81 82 75  BILITOT <0.2 0.2 <0.2  PROT 5.6* 6.2* 6.2*  ALBUMIN 3.0* 2.9* 3.1*   Recent Labs  Lab 06/15/24 0718  LIPASE 18  AMYLASE 37   No results for input(s): AMMONIA in the last 168 hours. Coagulation Profile: No results for input(s): INR, PROTIME in the last 168 hours. Cardiac Enzymes: No results for input(s): CKTOTAL, CKMB, CKMBINDEX, TROPONINI in the last 168 hours. BNP (last 3 results) Recent Labs    06/14/24 2030  PROBNP <50.0   HbA1C: No results for input(s): HGBA1C in the last 72 hours. CBG: Recent Labs  Lab 06/16/24 1651 06/16/24 2009 06/17/24 0652 06/17/24 0900 06/17/24 1402  GLUCAP 140* 151* 128* 99 151*   Lipid Profile: No results for input(s): CHOL, HDL, LDLCALC, TRIG, CHOLHDL, LDLDIRECT in the last 72 hours. Thyroid Function  Tests: No results for input(s): TSH, T4TOTAL, FREET4, T3FREE, THYROIDAB in the last 72 hours. Anemia Panel: No results for input(s): VITAMINB12, FOLATE, FERRITIN, TIBC, IRON , RETICCTPCT in the last 72 hours. Sepsis Labs: No results for input(s): PROCALCITON, LATICACIDVEN in the last 168 hours.  Recent Results (from the past 240 hours)  SARS Coronavirus 2 by RT PCR (hospital order, performed in [facility] hospital lab) *cepheid single result test* Anterior Nasal Swab     Status: None   Collection Time: 06/15/24 12:19 PM   Specimen: Anterior Nasal Swab  Result Value Ref Range Status   SARS Coronavirus 2 by RT PCR NEGATIVE NEGATIVE Final    Comment: Performed at [facility], [address] [place], [place] [address]  Respiratory (~20 pathogens) panel by PCR     Status: Abnormal   Collection Time: 06/15/24 12:19 PM   Specimen: Nasopharyngeal Swab; Respiratory  Result Value Ref Range Status   Adenovirus NOT DETECTED NOT DETECTED Final   Coronavirus 229E NOT DETECTED NOT DETECTED Final  Comment: (NOTE) The Coronavirus on the Respiratory Panel, DOES NOT test for the novel  Coronavirus (2019 nCoV)    Coronavirus HKU1 NOT DETECTED NOT DETECTED Final   Coronavirus NL63 NOT DETECTED NOT DETECTED Final   Coronavirus OC43 NOT DETECTED NOT DETECTED Final   Metapneumovirus NOT DETECTED NOT DETECTED Final   Rhinovirus / Enterovirus DETECTED (A) NOT DETECTED Final   Influenza A NOT DETECTED NOT DETECTED Final   Influenza B NOT DETECTED NOT DETECTED Final   Parainfluenza Virus 1 NOT DETECTED NOT DETECTED Final   Parainfluenza Virus 2 NOT DETECTED NOT DETECTED Final   Parainfluenza Virus 3 NOT DETECTED NOT DETECTED Final   Parainfluenza Virus 4 NOT DETECTED NOT DETECTED Final   Respiratory Syncytial Virus NOT DETECTED NOT DETECTED Final   Bordetella pertussis NOT DETECTED NOT DETECTED Final   Bordetella Parapertussis NOT DETECTED NOT DETECTED Final    Chlamydophila pneumoniae NOT DETECTED NOT DETECTED Final   Mycoplasma pneumoniae NOT DETECTED NOT DETECTED Final    Comment: Performed at [facility], [address], [place], [place] [address]         Radiology Studies: CT ABDOMEN PELVIS W CONTRAST Result Date: 06/15/2024 CLINICAL DATA:  Acute abdominal pain.  Known hernia. EXAM: CT ABDOMEN AND PELVIS WITH CONTRAST TECHNIQUE: Multidetector CT imaging of the abdomen and pelvis was performed using the standard protocol following bolus administration of intravenous contrast. RADIATION DOSE REDUCTION: This exam was performed according to the departmental dose-optimization program which includes automated exposure control, adjustment of the mA and/or kV according to patient size and/or use of iterative reconstruction technique. CONTRAST:  OMNIPAQUE  IOHEXOL  350 MG/ML SOLN COMPARISON:  CT 05/29/2024 FINDINGS: Lower chest: Assessed on concurrent chest CT, reported separately. Hepatobiliary: The liver is enlarged spanning 23 cm cranial caudal with mild steatosis. No focal liver lesion. Decompressed gallbladder. No calcified gallstone. No biliary dilatation. Pancreas: No ductal dilatation or inflammation. Spleen: Enlarged, 13.7 cm AP.  No focal abnormality. Adrenals/Urinary Tract: No adrenal nodule. No hydronephrosis. No renal calculi. No evidence of focal renal lesion. Partially distended urinary bladder, normal for degree of distension. Stomach/Bowel: The stomach is distended with ingested material. Large right lower abdominal ventral abdominal wall hernia contains nonobstructed noninflamed small bowel. Hernia size is similar to prior but cannot be accurately assessed. The anterolateral portion of the hernia is not included in the field of view due to habitus. No small bowel wall thickening, mesenteric inflammation or obstruction. Moderate volume of stool throughout the colon. No colonic inflammation. Vascular/Lymphatic: Normal caliber abdominal  aorta. Patent portal, splenic and mesenteric veins. No bulky lymphadenopathy. Reproductive: Gravid uterus. The fetus is in cephalic presentation. No adnexal mass. Other: No free air or free fluid. Generalized body wall edema which is increased from prior exam. Left lower ventral abdominal wall hernia contains only fat. Musculoskeletal: There are no acute or suspicious osseous abnormalities. IMPRESSION: 1. Large right lower abdominal ventral abdominal wall hernia containing nonobstructed noninflamed small bowel. Hernia size is similar to prior but cannot be accurately assessed, the anterolateral portion of the hernia is not included in the field of view due to habitus. 2. Gravid uterus. The fetus is in cephalic presentation. 3. Hepatosplenomegaly and hepatic steatosis. 4. Generalized body wall edema which is increased from prior exam. Electronically Signed   By: Andrea Gasman M.D.   On: 06/15/2024 18:12   CT Angio Chest Pulmonary Embolism (PE) W or WO Contrast Result Date: 06/15/2024 CLINICAL DATA:  Provided history: Dyspnea, chronic, chest wall or pleura  disease suspected EXAM: CT ANGIOGRAPHY CHEST WITH CONTRAST TECHNIQUE: Multidetector CT imaging of the chest was performed using the standard protocol during bolus administration of intravenous contrast. Multiplanar CT image reconstructions and MIPs were obtained to evaluate the vascular anatomy. RADIATION DOSE REDUCTION: This exam was performed according to the departmental dose-optimization program which includes automated exposure control, adjustment of the mA and/or kV according to patient size and/or use of iterative reconstruction technique. CONTRAST:  OMNIPAQUE  IOHEXOL  350 MG/ML SOLN COMPARISON:  Radiograph earlier today.  Chest CTA 05/29/2024 FINDINGS: Cardiovascular: There are no filling defects within the pulmonary arteries to suggest pulmonary embolus. Normal caliber thoracic aorta without acute aortic findings. The heart is upper normal in  size. No pericardial effusion. Mediastinum/Nodes: No mediastinal or hilar lymphadenopathy. Unremarkable appearance of the esophagus. Lungs/Pleura: Mild heterogeneous pulmonary parenchyma. Subsegmental atelectasis in the left lower lobe. No consolidative airspace disease. No features of pulmonary edema. No pleural effusion. The trachea and central airways are clear. Upper Abdomen: Assessed on concurrent abdominopelvic CT, reported separately Musculoskeletal: Degenerative change in the spine. There are no acute or suspicious osseous abnormalities. Soft tissue attenuation from habitus limits detailed assessment. Review of the MIP images confirms the above findings. IMPRESSION: 1. No pulmonary embolus. 2. Mild heterogeneous pulmonary parenchyma, can be seen with small airways disease. 3. Subsegmental atelectasis in the left lower lobe. Electronically Signed   By: Andrea Gasman M.D.   On: 06/15/2024 18:05        Scheduled Meds:  aspirin   162 mg Oral Daily   enoxaparin   100 mg Subcutaneous Q12H   ferrous sulfate   325 mg Oral QODAY   fluticasone  furoate-vilanterol  1 puff Inhalation Daily   furosemide   40 mg Intravenous Q12H   guaiFENesin   600 mg Oral BID   insulin  aspart  0-14 Units Subcutaneous TID PC   ipratropium-albuterol   3 mL Nebulization QID   labetalol   200 mg Oral BID   oxyCODONE -acetaminophen   1 tablet Oral Once   predniSONE   40 mg Oral Q breakfast   prenatal multivitamin  1 tablet Oral Q1200   Continuous Infusions:        Derryl Duval, MD [facility] 06/17/2024, 2:48 PM   "

## 2024-06-17 NOTE — Progress Notes (Signed)
 FACULTY PRACTICE ANTEPARTUM(COMPREHENSIVE) NOTE  Sarrinah Gardin is a 38 y.o. [id number] with Estimated Date of Delivery: 09/06/24   By  early ultrasound [redacted]w[redacted]d  who is admitted for acute dyspnea, abdominal pain.    Fetal presentation is cephalic. Length of Stay:  1  Days  Date of admission:06/14/2024  Subjective: Notes that her breathing is better and was able to ween of O2 during the day.  Notes that she still required O2 overnight.  Denies CP or SOB.  She is still noting pain on the right side of her abdomen that she notes as hernia pain.  Denies contractions or lower abdominal pain. Patient reports the fetal movement as active. Patient reports  vaginal bleeding as none. Patient describes fluid per vagina as None.  Vitals:  Blood pressure 124/76, pulse 79, temperature 97.9 F (36.6 C), temperature source Oral, resp. rate (!) 22, height 5' 8 (1.727 m), weight (!) 185.5 kg, SpO2 97%. Vitals:   06/17/24 0830 06/17/24 0832 06/17/24 0900 06/17/24 1013  BP:      Pulse:      Resp:      Temp:      TempSrc:      SpO2: (!) 89% 95% 97% 97%  Weight:      Height:       Physical Examination:  General appearance - alert, well appearing, and in no distress Mental status - normal mood, behavior, speech, dress, motor activity, and thought processes Chest - no tachypnea or retractions, bilateral rales noted- bilateral bases Heart - normal rate and regular rhythm Abdomen - obese, notes discomfort on left-side near hernia, no uterine tenderness, no rebound or guarding Musculoskeletal - no calf tenderness bilaterally Extremities - 2+ edema with slight brawning- unchanged from yesterday Skin - warm and dry   Fetal Monitoring:  Baseline: 145 bpm, Variability: moderate  Labs:  Results for orders placed or performed during the hospital encounter of 06/14/24 (from the past 24 hours)  Glucose, capillary   Collection Time: 06/16/24 12:45 PM  Result Value Ref Range   Glucose-Capillary 143 (H) 70 - 99  mg/dL  Glucose, capillary   Collection Time: 06/16/24  4:51 PM  Result Value Ref Range   Glucose-Capillary 140 (H) 70 - 99 mg/dL  Glucose, capillary   Collection Time: 06/16/24  8:09 PM  Result Value Ref Range   Glucose-Capillary 151 (H) 70 - 99 mg/dL  CBC with Differential/Platelet   Collection Time: 06/17/24  4:51 AM  Result Value Ref Range   WBC 12.0 (H) 4.0 - 10.5 K/uL   RBC 3.39 (L) 3.87 - 5.11 MIL/uL   Hemoglobin 9.5 (L) 12.0 - 15.0 g/dL   HCT 69.7 (L) 63.9 - 53.9 %   MCV 89.1 80.0 - 100.0 fL   MCH 28.0 26.0 - 34.0 pg   MCHC 31.5 30.0 - 36.0 g/dL   RDW 82.0 (H) 88.4 - 84.4 %   Platelets 202 150 - 400 K/uL   nRBC 0.2 0.0 - 0.2 %   Neutrophils Relative % 76 %   Neutro Abs 9.1 (H) 1.7 - 7.7 K/uL   Lymphocytes Relative 17 %   Lymphs Abs 2.0 0.7 - 4.0 K/uL   Monocytes Relative 4 %   Monocytes Absolute 0.5 0.1 - 1.0 K/uL   Eosinophils Relative 1 %   Eosinophils Absolute 0.1 0.0 - 0.5 K/uL   Basophils Relative 0 %   Basophils Absolute 0.0 0.0 - 0.1 K/uL   Immature Granulocytes 2 %   Abs Immature Granulocytes 0.24 (  H) 0.00 - 0.07 K/uL  Basic metabolic panel with GFR   Collection Time: 06/17/24  4:51 AM  Result Value Ref Range   Sodium 135 135 - 145 mmol/L   Potassium 3.9 3.5 - 5.1 mmol/L   Chloride 98 98 - 111 mmol/L   CO2 28 22 - 32 mmol/L   Glucose, Bld 124 (H) 70 - 99 mg/dL   BUN 16 6 - 20 mg/dL   Creatinine, Ser 9.22 0.44 - 1.00 mg/dL   Calcium  8.9 8.9 - 10.3 mg/dL   GFR, Estimated >39 >39 mL/min   Anion gap 9 5 - 15  Glucose, capillary   Collection Time: 06/17/24  6:52 AM  Result Value Ref Range   Glucose-Capillary 128 (H) 70 - 99 mg/dL  Glucose, capillary   Collection Time: 06/17/24  9:00 AM  Result Value Ref Range   Glucose-Capillary 99 70 - 99 mg/dL    Imaging Studies:    US  MFM FETAL BPP WO NON STRESS Result Date: 06/15/2024 ----------------------------------------------------------------------  OBSTETRICS REPORT                        (Signed Final  06/15/2024 10:06 pm) ---------------------------------------------------------------------- Patient Info  ID #:       [id number]                          D.O.B.:  1986/01/26 (38 yrs)(F)  Name:       HARLENE Koos                  Visit Date: 06/15/2024 07:15 am ---------------------------------------------------------------------- Performed By  Attending:        Nathanel Fetters      Secondary Phy.:    [facility]                    MD                                                              Care  Performed By:     Metta Chang RDMS         [facility]  Referred By:      [facility] MAU/Triage ---------------------------------------------------------------------- Orders  #  Description                           Code        Ordered By  1  US  MFM FETAL BPP WO NON               23180.98    KATHLEEN DUNN     STRESS ----------------------------------------------------------------------  #  Order #                     Accession #  Episode #  1  [id number]                   [id number]                 [id number] ---------------------------------------------------------------------- Indications  Decreased fetal movement                        O36.8190  Abdominal pain in pregnancy                     O99.89  Obesity complicating pregnancy, second          O99.212  trimester (BMI 57)  [redacted] weeks gestation of pregnancy                 Z3A.28 ---------------------------------------------------------------------- Fetal Evaluation  Num Of Fetuses:          1  Cardiac Activity:        Observed  Presentation:            Cephalic  Placenta:                Anterior  Amniotic Fluid  AFI FV:      Within normal limits                              Largest Pocket(cm)                              6 ---------------------------------------------------------------------- Biophysical Evaluation  Amniotic F.V:   Pocket => 2 cm             F.  Tone:         Observed  F. Movement:    Observed                   Score:           8/8  F. Breathing:   Observed ---------------------------------------------------------------------- OB History  Gravidity:    9         Term:   0        Prem:   7        SAB:   1  TOP:          0       Ectopic:  0        Living: 5 ---------------------------------------------------------------------- Gestational Age  Best:          28w 1d     Det. By:  Early Ultrasound         EDD:   09/06/24                                      (01/22/24) ---------------------------------------------------------------------- Impression  Antenatal testing performed given decreased fetal movement.  The biophysical profile was 8/8 with good fetal movement and  amniotic fluid volume. ---------------------------------------------------------------------- Recommendations  Clinical correlation recommended. ----------------------------------------------------------------------               Nathanel Fetters, MD Electronically Signed Final Report   06/15/2024 10:06 pm ----------------------------------------------------------------------   US  MFM Fetal BPP Wo Non Stress Result Date: 06/15/2024 ----------------------------------------------------------------------  OBSTETRICS REPORT                    (Corrected Final 06/15/2024 09:37  pm) ---------------------------------------------------------------------- Patient Info  ID #:       [id number]                          D.O.B.:  11-07-1985 (38 yrs)(F)  Name:       Suzanna Gambone                  Visit Date: 06/14/2024 10:17 pm ---------------------------------------------------------------------- Performed By  Attending:        Nathanel Fetters      Referred By:       [facility] MAU/Triage                    MD  Performed By:     Nat GORMAN Plant     [facility]                    BS, RDMS                                  [facility]  ---------------------------------------------------------------------- Orders  #  Description                           Code        Ordered By  1  US  MFM FETAL BPP WO NON               76819.01    LISA LEFTWICH-     STRESS                                            KIRBY ----------------------------------------------------------------------  #  Order #                     Accession #                Episode #  1  [id number]                   [id number]                 [id number] ---------------------------------------------------------------------- Indications  Abdominal pain in pregnancy                     O99.89  [redacted] weeks gestation of pregnancy                 Z3A.28  Decreased fetal movement                        O36.8190  Obesity complicating pregnancy, second          O99.212  trimester (BMI 57) ---------------------------------------------------------------------- Fetal Evaluation  Num Of Fetuses:          1  Fetal Heart Rate(bpm):   133  Cardiac Activity:        Observed  Presentation:            Cephalic  Placenta:                Anterior  P. Cord Insertion:       Previously seen  Amniotic Fluid  AFI FV:  Within normal limits                              Largest Pocket(cm)                              7.6 ---------------------------------------------------------------------- Biophysical Evaluation  Amniotic F.V:   Within normal limits       F. Tone:         Observed  F. Movement:    Observed                   Score:           6/8  F. Breathing:   Not Observed ---------------------------------------------------------------------- OB History  Gravidity:    9         Term:   0        Prem:   7        SAB:   1  TOP:          0       Ectopic:  0        Living: 5 ---------------------------------------------------------------------- Gestational Age  Best:          28w 0d     Det. By:  Early Ultrasound         EDD:   09/06/24                                      (01/22/24)  ---------------------------------------------------------------------- Impression  Antenatal testing performed given decreased fetal movement.  The biophysical profile was 6/8 with good fetal movement and  amniotic fluid volume ---------------------------------------------------------------------- Recommendations  Clinical correlation recommended. ----------------------------------------------------------------------                    Nathanel Fetters, MD Electronically Signed Corrected Final Report  06/15/2024 09:37 pm ----------------------------------------------------------------------   CT ABDOMEN PELVIS W CONTRAST Result Date: 06/15/2024 CLINICAL DATA:  Acute abdominal pain.  Known hernia. EXAM: CT ABDOMEN AND PELVIS WITH CONTRAST TECHNIQUE: Multidetector CT imaging of the abdomen and pelvis was performed using the standard protocol following bolus administration of intravenous contrast. RADIATION DOSE REDUCTION: This exam was performed according to the departmental dose-optimization program which includes automated exposure control, adjustment of the mA and/or kV according to patient size and/or use of iterative reconstruction technique. CONTRAST:  OMNIPAQUE  IOHEXOL  350 MG/ML SOLN COMPARISON:  CT 05/29/2024 FINDINGS: Lower chest: Assessed on concurrent chest CT, reported separately. Hepatobiliary: The liver is enlarged spanning 23 cm cranial caudal with mild steatosis. No focal liver lesion. Decompressed gallbladder. No calcified gallstone. No biliary dilatation. Pancreas: No ductal dilatation or inflammation. Spleen: Enlarged, 13.7 cm AP.  No focal abnormality. Adrenals/Urinary Tract: No adrenal nodule. No hydronephrosis. No renal calculi. No evidence of focal renal lesion. Partially distended urinary bladder, normal for degree of distension. Stomach/Bowel: The stomach is distended with ingested material. Large right lower abdominal ventral abdominal wall hernia contains nonobstructed noninflamed  small bowel. Hernia size is similar to prior but cannot be accurately assessed. The anterolateral portion of the hernia is not included in the field of view due to habitus. No small bowel wall thickening, mesenteric inflammation or obstruction. Moderate volume of stool throughout the colon. No colonic inflammation. Vascular/Lymphatic: Normal caliber abdominal aorta. Patent portal, splenic and mesenteric veins. No bulky lymphadenopathy.  Reproductive: Gravid uterus. The fetus is in cephalic presentation. No adnexal mass. Other: No free air or free fluid. Generalized body wall edema which is increased from prior exam. Left lower ventral abdominal wall hernia contains only fat. Musculoskeletal: There are no acute or suspicious osseous abnormalities. IMPRESSION: 1. Large right lower abdominal ventral abdominal wall hernia containing nonobstructed noninflamed small bowel. Hernia size is similar to prior but cannot be accurately assessed, the anterolateral portion of the hernia is not included in the field of view due to habitus. 2. Gravid uterus. The fetus is in cephalic presentation. 3. Hepatosplenomegaly and hepatic steatosis. 4. Generalized body wall edema which is increased from prior exam. Electronically Signed   By: Andrea Gasman M.D.   On: 06/15/2024 18:12   CT Angio Chest Pulmonary Embolism (PE) W or WO Contrast Result Date: 06/15/2024 CLINICAL DATA:  Provided history: Dyspnea, chronic, chest wall or pleura disease suspected EXAM: CT ANGIOGRAPHY CHEST WITH CONTRAST TECHNIQUE: Multidetector CT imaging of the chest was performed using the standard protocol during bolus administration of intravenous contrast. Multiplanar CT image reconstructions and MIPs were obtained to evaluate the vascular anatomy. RADIATION DOSE REDUCTION: This exam was performed according to the departmental dose-optimization program which includes automated exposure control, adjustment of the mA and/or kV according to patient size and/or  use of iterative reconstruction technique. CONTRAST:  OMNIPAQUE  IOHEXOL  350 MG/ML SOLN COMPARISON:  Radiograph earlier today.  Chest CTA 05/29/2024 FINDINGS: Cardiovascular: There are no filling defects within the pulmonary arteries to suggest pulmonary embolus. Normal caliber thoracic aorta without acute aortic findings. The heart is upper normal in size. No pericardial effusion. Mediastinum/Nodes: No mediastinal or hilar lymphadenopathy. Unremarkable appearance of the esophagus. Lungs/Pleura: Mild heterogeneous pulmonary parenchyma. Subsegmental atelectasis in the left lower lobe. No consolidative airspace disease. No features of pulmonary edema. No pleural effusion. The trachea and central airways are clear. Upper Abdomen: Assessed on concurrent abdominopelvic CT, reported separately Musculoskeletal: Degenerative change in the spine. There are no acute or suspicious osseous abnormalities. Soft tissue attenuation from habitus limits detailed assessment. Review of the MIP images confirms the above findings. IMPRESSION: 1. No pulmonary embolus. 2. Mild heterogeneous pulmonary parenchyma, can be seen with small airways disease. 3. Subsegmental atelectasis in the left lower lobe. Electronically Signed   By: Andrea Gasman M.D.   On: 06/15/2024 18:05   DG Chest Portable 1 View Result Date: 06/15/2024 EXAM: 1 VIEW(S) XRAY OF THE CHEST 06/15/2024 06:02:25 AM COMPARISON: 05/27/2024 CLINICAL HISTORY: RUQ pain, recent fall, eval ribs FINDINGS: LUNGS AND PLEURA: Low lung volumes. Bronchovascular crowding versus mild pulmonary edema. No pleural effusion. No pneumothorax. HEART AND MEDIASTINUM: No acute abnormality of the cardiac and mediastinal silhouettes. BONES AND SOFT TISSUES: No acute osseous abnormality. IMPRESSION: 1. Bronchovascular crowding versus mild pulmonary edema. Electronically signed by: Waddell Calk MD 06/15/2024 06:11 AM EST RP Workstation: [id number]   DG Elbow 2 Views Right Result Date:  06/14/2024 EXAM: 1 or 2 VIEW(S) XRAY OF THE RIGHT ELBOW COMPARISON: 07/24/2020 CLINICAL HISTORY: right arm pain, fall FINDINGS: BONES AND JOINTS: Old healed distal humeral diaphyseal fracture partially visualized. No malalignment. SOFT TISSUES: The soft tissues are unremarkable. IMPRESSION: 1. No acute fracture or dislocation. 2. Old healed distal humeral diaphyseal fracture partially visualized. Electronically signed by: Oneil Devonshire MD 06/14/2024 11:38 PM EST RP Workstation: [id number]   US  Abdomen Limited RUQ (LIVER/GB) Result Date: 06/14/2024 EXAM: Right Upper Quadrant Abdominal Ultrasound 06/14/2024 11:00:06 PM TECHNIQUE: Real-time ultrasonography of the right upper quadrant of the abdomen was  performed. COMPARISON: None available. CLINICAL HISTORY: RUQ abdominal pain. FINDINGS: LIVER: Normal echogenicity. No intrahepatic biliary ductal dilatation. No evidence of mass. Patent portal vein with antegrade flow. BILIARY SYSTEM: No pericholecystic fluid or wall thickening. No cholelithiasis. Common bile duct measures 3 mm. Patient very tender to transducer pressure in the right upper quadrant. OTHER: No right upper quadrant ascites. IMPRESSION: 1. Right upper quadrant tenderness with transducer pressure. However, there is no wall thickening, pericholecystic fluid, or cholelithiasis. No biliary dilation. Electronically signed by: Norman Gatlin MD 06/14/2024 11:08 PM EST RP Workstation: [id number]      ASSESSMENT: [id number] [redacted]w[redacted]d Estimated Date of Delivery: 09/06/24  Principal problem: -Acute dyspnea- asthma exacerbation vs CHF - Rhinovirus - Abdominal pain   Secondary concerns: - Intrauterine pregnancy at 28W 2D - History of congestive heart failure - Morbid obesity - History of prior C-sections x 5 with complications - Ventral hernia without obstruction - Chronic hypertension - History of pulmonary embolism during pregnancy - Gestational diabetes   PLAN: 1) Acute dyspnea- concern for asthma  exacerbation vs CHF -clinically doing better today -management per [facility] team -still requiring O2 at night- will discuss with hospitalist regarding sleep apnea work up -on prednisone  daily -DuoNebs as needed and Mucinex   2) Abdominal pain with known ventral hernia  -no acute abnormalities noted -discussed with patient that some of pain may likely be pregnancy related complicated by co-morbidities including hernia -encourage heating pack, tylenol  as needed  3) Fetal-well being -reassuring  4) HTN, h/o PE in pregnancy -continue home medications including Lovenox , Lasix  and Labetalol  -encourage ambulation as tolerated  -GDMA1 Some elevation likely due to steroid Currently on sliding scale and will continue to monitor Working to obtain CGM sensor for outpatient monitoring      DISP: Continue inpatient care as outlined above  Delon HERO Kenslee Achorn 06/17/2024,10:29 AM

## 2024-06-17 NOTE — Inpatient Diabetes Management (Addendum)
 Inpatient Diabetes Program Recommendations  Diabetes Treatment Program Recommendations  ADA Standards of Care Diabetes in Pregnancy Target Glucose Ranges:  Fasting: 70 - 95 mg/dL 1 hr postprandial: Less than 140mg /dL (from first bite of meal) 2 hr postprandial: Less than 120 mg/dL (from first bite of meal)     Latest Reference Range & Units 06/16/24 08:19 06/16/24 12:45 06/16/24 16:51 06/16/24 20:09 06/17/24 06:52  Glucose-Capillary 70 - 99 mg/dL 93 856 (H) 859 (H) 848 (H) 128 (H)   Review of Glycemic Control  Diabetes history: No; prior GDM hx; [redacted]W[redacted]D Outpatient Diabetes medications: None Current orders for Inpatient glycemic control: Novolog  0-14 units TID 2 hour after meals; Prednisone  40 mg QAM  Inpatient Diabetes Program Recommendations:    Insulin : If steroids are continued, please consider ordering Novolog  3 units TID with meals for meal coverage if patient eats at least 50% of meals.  Addendum 06/17/24@13 :00--Spoke with patient at bedside regarding glucose and GDM. Patient states that she had GDM 16 years ago with prior pregnancy.  Patient states that she did not take any DM medication when she had GDM.   Patient states that she failed her 1 hour glucose test recently and she is suppose to go back and do the 3 hour glucose test.  Informed patient that she has been getting steroids as inpatient and they are contributing to hyperglycemia.  Discussed basic pathophysiology of GDM, glucose goals, importance of glycemic control, glycemic impact on mother and neonate, potential complications if glucose is not well controlled during pregnancy, gestation carb modified diet, and long term risk of developing DM2 later in life. Discussed checking glucose fasting and 2 hour post prandial. Patient drinks regular sodas and juice. She had an [employer] fruit juice at bedside and was not aware that it had carbs in it. Discussed reading nutrition labels and paying attention to carbohydrates in food and  drinks.  Patient reports that the only vegetables that she likes are corn and potatoes. Patient reports that she has been eating Christmas candy recently; discussed looking at the packages to see how many carbohydrates are in the candy and to eat it sparingly. Discussed cutting back on carbs and eating them in moderation. Encouraged patient to eliminate sugary beverages from diet. Patient done finger sticks during prior GDM dx. Discussed doing finger sticks and possibly using a CGM sensor. Patient states she would be interested in using a CGM sensor if covered by insurance. Informed patient I would ask outpatient [facility] pharmacy to see if it is covered and then discuss with attending provider if covered.  Asked patient to be sure to stay in close contact with OB regarding glucose after discharge so medications can be adjusted if needed. Informed patient that RD would be consulted for further diet education.  Patient verbalized understanding of information and has no questions at this time.    Per outpatient [facility] pharmacy check, patient's insurance requires PA for FreeStyle Libre 3 Plus sensors. Dr. Ozan provided permission for outpatient [facility] pharmacy to run a PA under her name. Inpatient diabetes coordinator team to follow up with patient on 12/24 regarding FreeStyle Libre 3 Plus if PA approved by insurance.   Addendum 06/17/24@14 :40-PA for FreeStyle Libre 3 Plus sensor was approved. Educated patient on [employer] 3 Plus CGM regarding application and changing CGM sensor (alternate every 14 days on back of arms), 1 hour warm-up, how to scan sensor to start a new sensor, and how to use app to check glucose.   Patient has been  given Freestyle Libre 3 Plus sensor samples (2).  Provided educational packet regarding FreeStyle Libre 3 Plus CGM.  Patient plans to use android FreeStyle Libre3 app to read FreeStyle Libre 3 Plus sensor and was able to download the app and create an account. Patient was able to apply a new  sensor to back of left upper arm.  Informed patient that it would be requested that attending provider provide Rx for first month of FreeStyle Libre 3 Plus sensors and that she have PCP/OB continue to provide Rx for FreeStyle Libre 3 Plus sensors going forward. Asked patient to be sure to let PCP/OB know about [place] 3 and allow provider to review reports from [place] 3 app so the provider can use the information to continue to make adjustments with DM medications if needed. Informed patient that it would be requested that she receive a Rx for the glucose monitoring kit as well. Patient verbalized understanding of information and has no questions at this time.  Thanks,   Earnie Gainer, RN, MSN, CDCES Diabetes Coordinator Inpatient Diabetes Program [phone] (Team Pager from 8am to 5pm)

## 2024-06-17 NOTE — Telephone Encounter (Signed)
 Pharmacy Patient Advocate Encounter  Received notification from [employer] that Prior Authorization for FreeStyle Libre 3 Plus Sensor has been APPROVED from 06/17/24 to 12/14/24. Ran test claim, Copay is $0. This test claim was processed through [facility]- copay amounts may vary at other pharmacies due to pharmacy/plan contracts, or as the patient moves through the different stages of their insurance plan.   PA #/Case ID/Reference #: [id number]

## 2024-06-17 NOTE — Progress Notes (Signed)
 RN spoke with Dr. Lola in clinic, GDM results will be addressed while pt is admitted.    Waddell, RN

## 2024-06-17 NOTE — Progress Notes (Addendum)
 SATURATION QUALIFICATIONS: (This note is used to comply with regulatory documentation for home oxygen)  Patient Saturations on Room Air at Rest = 87%  Patient Saturations on Room Air while Ambulating = 93%   Please briefly explain why patient needs home oxygen:  Pt requires o2 while sleeping. Pt o2 sats dropped to 89% overnight (06/17/2024) and required 4L of o2.   On 06/15/2024 at 0933- pt o2 sats dropped as low 77% RA.  Lauraine Drew, RN

## 2024-06-17 NOTE — Progress Notes (Addendum)
" °  °  Durable Medical Equipment  (From admission, onward)           Start     Ordered   06/17/24 1443  For home use only DME oxygen  Once       Question Answer Comment  Length of Need 6 Months   Mode or (Route) Nasal cannula   Liters per Minute 2   Frequency Continuous (stationary and portable oxygen unit needed)   Oxygen delivery system: Gas   Oxygen delivery system: Portable concentrator (POC)      06/17/24 1443   06/17/24 1219  For home use only DME Shower stool  Once        06/17/24 1219   06/17/24 0000  For home use only DME oxygen       Question Answer Comment  Length of Need 6 Months   Mode or (Route) Nasal cannula   Frequency Continuous (stationary and portable oxygen unit needed)   Oxygen delivery system: Gas   Oxygen delivery system: Portable concentrator (POC)      06/17/24 1407                Sharin Lauraine SAILOR, RN  Registered Nurse Nursing   Progress Notes    Signed   Date of Service: 06/17/2024 12:04 PM   Signed      SATURATION QUALIFICATIONS: (This note is used to comply with regulatory documentation for home oxygen)   Patient Saturations on Room Air at Rest = 87%   Patient Saturations on Room Air while Ambulating = 93%     Please briefly explain why patient needs home oxygen:   Pt requires o2 while sleeping. Pt o2 sats dropped to 89% overnight (06/17/2024) and required 4L of o2.    On 06/15/2024 at 0933- pt o2 sats dropped as low 77% RA.   Lauraine Sharin, RN         "

## 2024-06-18 ENCOUNTER — Other Ambulatory Visit (HOSPITAL_COMMUNITY): Payer: Self-pay

## 2024-06-18 ENCOUNTER — Other Ambulatory Visit: Payer: Self-pay

## 2024-06-18 ENCOUNTER — Other Ambulatory Visit (HOSPITAL_BASED_OUTPATIENT_CLINIC_OR_DEPARTMENT_OTHER): Payer: Self-pay

## 2024-06-18 DIAGNOSIS — B348 Other viral infections of unspecified site: Secondary | ICD-10-CM | POA: Diagnosis not present

## 2024-06-18 DIAGNOSIS — R1013 Epigastric pain: Secondary | ICD-10-CM | POA: Diagnosis not present

## 2024-06-18 DIAGNOSIS — I5033 Acute on chronic diastolic (congestive) heart failure: Secondary | ICD-10-CM | POA: Diagnosis not present

## 2024-06-18 LAB — BASIC METABOLIC PANEL WITH GFR
Anion gap: 9 (ref 5–15)
BUN: 17 mg/dL (ref 6–20)
CO2: 29 mmol/L (ref 22–32)
Calcium: 9.2 mg/dL (ref 8.9–10.3)
Chloride: 98 mmol/L (ref 98–111)
Creatinine, Ser: 0.63 mg/dL (ref 0.44–1.00)
GFR, Estimated: >60 mL/min
Glucose, Bld: 93 mg/dL (ref 70–99)
Potassium: 3.7 mmol/L (ref 3.5–5.1)
Sodium: 136 mmol/L (ref 135–145)

## 2024-06-18 LAB — GLUCOSE, CAPILLARY
Glucose-Capillary: 93 mg/dL (ref 70–99)
Glucose-Capillary: 118 mg/dL — ABNORMAL HIGH (ref 70–99)

## 2024-06-18 MED ORDER — GUAIFENESIN ER 600 MG PO TB12
600.0000 mg | ORAL_TABLET | Freq: Two times a day (BID) | ORAL | 0 refills | Status: AC
  Filled 2024-06-18: qty 30, 15d supply, fill #0

## 2024-06-18 MED ORDER — BISACODYL 5 MG PO TBEC
5.0000 mg | DELAYED_RELEASE_TABLET | Freq: Every day | ORAL | 0 refills | Status: AC | PRN
  Filled 2024-06-18: qty 30, 30d supply, fill #0

## 2024-06-18 MED ORDER — IPRATROPIUM-ALBUTEROL 0.5-2.5 (3) MG/3ML IN SOLN
3.0000 mL | Freq: Four times a day (QID) | RESPIRATORY_TRACT | 0 refills | Status: AC | PRN
  Filled 2024-06-18: qty 360, 30d supply, fill #0

## 2024-06-18 MED ORDER — FUROSEMIDE 40 MG PO TABS
40.0000 mg | ORAL_TABLET | Freq: Three times a day (TID) | ORAL | 5 refills | Status: DC
  Filled 2024-06-18: qty 60, 20d supply, fill #0

## 2024-06-18 MED ORDER — GABAPENTIN 100 MG PO CAPS
200.0000 mg | ORAL_CAPSULE | Freq: Two times a day (BID) | ORAL | 0 refills | Status: AC | PRN
  Filled 2024-06-18: qty 60, 15d supply, fill #0

## 2024-06-18 MED ORDER — PREDNISONE 20 MG PO TABS
40.0000 mg | ORAL_TABLET | Freq: Every day | ORAL | 0 refills | Status: AC
  Filled 2024-06-18: qty 4, 2d supply, fill #0

## 2024-06-18 MED ORDER — LABETALOL HCL 200 MG PO TABS
200.0000 mg | ORAL_TABLET | Freq: Two times a day (BID) | ORAL | 0 refills | Status: AC
  Filled 2024-06-18: qty 60, 30d supply, fill #0

## 2024-06-18 MED ORDER — DULERA 200-5 MCG/ACT IN AERO
1.0000 | INHALATION_SPRAY | Freq: Two times a day (BID) | RESPIRATORY_TRACT | 0 refills | Status: AC
  Filled 2024-06-18: qty 13, 30d supply, fill #0

## 2024-06-18 MED ORDER — CYCLOBENZAPRINE HCL 10 MG PO TABS
10.0000 mg | ORAL_TABLET | Freq: Three times a day (TID) | ORAL | 0 refills | Status: AC | PRN
  Filled 2024-06-18: qty 30, 10d supply, fill #0

## 2024-06-18 MED ORDER — CYCLOBENZAPRINE HCL 10 MG PO TABS: 10.0000 mg | ORAL_TABLET | Freq: Three times a day (TID) | ORAL | Status: DC | PRN

## 2024-06-18 MED ORDER — TERBINAFINE HCL 1 % EX CREA
1.0000 | TOPICAL_CREAM | Freq: Two times a day (BID) | CUTANEOUS | 0 refills | Status: AC
  Filled 2024-06-18: qty 15, 8d supply, fill #0

## 2024-06-18 MED ORDER — OXYCODONE HCL 5 MG PO TABS
5.0000 mg | ORAL_TABLET | ORAL | 0 refills | Status: AC | PRN
  Filled 2024-06-18: qty 30, 3d supply, fill #0

## 2024-06-18 MED ORDER — FUROSEMIDE 40 MG PO TABS
40.0000 mg | ORAL_TABLET | Freq: Three times a day (TID) | ORAL | 5 refills | Status: AC
  Filled 2024-06-18: qty 60, 20d supply, fill #0

## 2024-06-18 MED ORDER — IPRATROPIUM-ALBUTEROL 0.5-2.5 (3) MG/3ML IN SOLN
3.0000 mL | Freq: Four times a day (QID) | RESPIRATORY_TRACT | 0 refills | Status: DC | PRN
  Filled 2024-06-18: qty 360, 30d supply, fill #0

## 2024-06-18 MED ORDER — PREDNISONE 20 MG PO TABS
40.0000 mg | ORAL_TABLET | Freq: Every day | ORAL | 0 refills | Status: DC
  Filled 2024-06-18: qty 4, 2d supply, fill #0

## 2024-06-18 MED ORDER — INSULIN ASPART 100 UNIT/ML IJ SOLN
INTRAMUSCULAR | Status: AC
  Administered 2024-06-18: 100 [IU]
  Filled 2024-06-18: qty 1

## 2024-06-18 MED ORDER — GABAPENTIN 100 MG PO CAPS
200.0000 mg | ORAL_CAPSULE | Freq: Two times a day (BID) | ORAL | 0 refills | Status: DC
  Filled 2024-06-18: qty 60, 15d supply, fill #0

## 2024-06-18 MED ORDER — OXYCODONE HCL 5 MG PO TABS
5.0000 mg | ORAL_TABLET | ORAL | Status: DC | PRN
  Administered 2024-06-18 (×2): 10 mg via ORAL
  Filled 2024-06-18 (×2): qty 2

## 2024-06-18 MED ORDER — GABAPENTIN 100 MG PO CAPS
200.0000 mg | ORAL_CAPSULE | Freq: Two times a day (BID) | ORAL | Status: DC
  Filled 2024-06-18: qty 2

## 2024-06-18 MED ORDER — ACETAMINOPHEN 325 MG PO TABS: 650.0000 mg | ORAL_TABLET | Freq: Four times a day (QID) | ORAL | Status: DC | PRN

## 2024-06-18 MED ORDER — ACETAMINOPHEN 325 MG PO TABS
650.0000 mg | ORAL_TABLET | ORAL | 0 refills | Status: AC | PRN
  Filled 2024-06-18: qty 30, 3d supply, fill #0

## 2024-06-18 NOTE — TOC Transition Note (Signed)
 Transition of Care [facility]) - Discharge Note   Patient Details  Name: Brooke Sandoval MRN: [id number] Date of Birth: [birth year]  [facility] CM/SW Contact:  Corean JAYSON Canary, RN Phone Number: 06/18/2024, 1:03 PM   Clinical Narrative:     Florence Evan to bring oxygen to room for discharge. The shower stool with be delivered to home. Updated nursing, Cab voucher at desk for transportation home  Final next level of care: Home/Self Care Barriers to Discharge: No Barriers Identified   Patient Goals and CMS Choice            Discharge Placement                       Discharge Plan and Services Additional resources added to the After Visit Summary for                                       Social Drivers of Health (SDOH) Interventions SDOH Screenings   Food Insecurity: No Food Insecurity (06/15/2024)  Housing: High Risk (06/15/2024)  Transportation Needs: Unmet Transportation Needs (06/15/2024)  Utilities: At Risk (06/15/2024)  Tobacco Use: Low Risk (06/14/2024)     Readmission Risk Interventions    06/17/2024   12:32 PM  Readmission Risk Prevention Plan  Post Dischage Appt Complete  Medication Screening Complete  Transportation Screening Complete

## 2024-06-18 NOTE — Plan of Care (Signed)
  Problem: Education: Goal: Ability to describe self-care measures that may prevent or decrease complications (Diabetes Survival Skills Education) will improve Outcome: Adequate for Discharge Goal: Individualized Educational Video(s) Outcome: Adequate for Discharge   Problem: Coping: Goal: Ability to adjust to condition or change in health will improve Outcome: Adequate for Discharge   Problem: Fluid Volume: Goal: Ability to maintain a balanced intake and output will improve Outcome: Adequate for Discharge   Problem: Health Behavior/Discharge Planning: Goal: Ability to identify and utilize available resources and services will improve Outcome: Adequate for Discharge Goal: Ability to manage health-related needs will improve Outcome: Adequate for Discharge   Problem: Metabolic: Goal: Ability to maintain appropriate glucose levels will improve Outcome: Adequate for Discharge   Problem: Nutritional: Goal: Maintenance of adequate nutrition will improve Outcome: Adequate for Discharge Goal: Progress toward achieving an optimal weight will improve Outcome: Adequate for Discharge   Problem: Skin Integrity: Goal: Risk for impaired skin integrity will decrease Outcome: Adequate for Discharge   Problem: Tissue Perfusion: Goal: Adequacy of tissue perfusion will improve Outcome: Adequate for Discharge   Problem: Education: Goal: Knowledge of disease or condition will improve Outcome: Adequate for Discharge Goal: Knowledge of the prescribed therapeutic regimen will improve Outcome: Adequate for Discharge Goal: Individualized Educational Video(s) Outcome: Adequate for Discharge   Problem: Clinical Measurements: Goal: Complications related to the disease process, condition or treatment will be avoided or minimized Outcome: Adequate for Discharge   Problem: Education: Goal: Knowledge of General Education information will improve Description: Including pain rating scale,  medication(s)/side effects and non-pharmacologic comfort measures Outcome: Adequate for Discharge   Problem: Health Behavior/Discharge Planning: Goal: Ability to manage health-related needs will improve Outcome: Adequate for Discharge   Problem: Clinical Measurements: Goal: Ability to maintain clinical measurements within normal limits will improve Outcome: Adequate for Discharge Goal: Will remain free from infection Outcome: Adequate for Discharge Goal: Diagnostic test results will improve Outcome: Adequate for Discharge Goal: Respiratory complications will improve Outcome: Adequate for Discharge Goal: Cardiovascular complication will be avoided Outcome: Adequate for Discharge   Problem: Activity: Goal: Risk for activity intolerance will decrease Outcome: Adequate for Discharge   Problem: Nutrition: Goal: Adequate nutrition will be maintained Outcome: Adequate for Discharge   Problem: Coping: Goal: Level of anxiety will decrease Outcome: Adequate for Discharge   Problem: Elimination: Goal: Will not experience complications related to bowel motility Outcome: Adequate for Discharge Goal: Will not experience complications related to urinary retention Outcome: Adequate for Discharge   Problem: Pain Managment: Goal: General experience of comfort will improve and/or be controlled Outcome: Adequate for Discharge   Problem: Safety: Goal: Ability to remain free from injury will improve Outcome: Adequate for Discharge   Problem: Skin Integrity: Goal: Risk for impaired skin integrity will decrease Outcome: Adequate for Discharge

## 2024-06-18 NOTE — Discharge Summary (Signed)
 Antenatal Physician Discharge Summary  Patient ID: Brooke Sandoval MRN: [id number] DOB/AGE: [birth year] 38 y.o.  Admit date: 06/14/2024 Discharge date: 06/18/2024  Admission Diagnoses: Abdominal pain [R10.9] Shortness of breath Rhinovirus Pregnancy at 28 weeks  Discharge Diagnoses:  Abdominal pain [R10.9] Shortness of breath Chronic diastolic heart failure exacerbation vs asthma exacerbation Rhinovirus Pregnancy at 28 weeks  Prenatal Procedures: ultrasound, CTPE, CXR, labs, CT abdomen and pelvis  Consults: [facility] Hospitalists, DM, Case Management  Hospital Course:  Brooke Sandoval is a 38 y.o. [id number] with IUP at [redacted]w[redacted]d admitted for abdominal pain and shortness of breath. For the abdominal pain is following a fall on 12/21.      She noted DFM. She had an initial BPP which was 6/8. Repeat BPP on 12/21 was 8/8. FHT monitoring throughout her admission was reactive and without decels.  She was also noted to be hypoxic and was started on oxygen. She was on 4L of O2. She had a CTPE which was negative. She had a CXR which showed pulmonary edema versus bronchovascular crowding.  She was ultimately positive for rhinovirus. Her BNP was normal. [facility] was consulted and recommendations following. She was able to be weaned off of oxygen during the day but still had O2 needs overnight (ranging up to 5L). She reported she likely has chronic issues with apnea as she normally wakes frequently at night. She has not had a sleep study. Outpatient referral placed. By the time of discharge, [facility] felt she was stable for discharge with home O2 and shower stool. These were both arranged. Her lasix  was also increased. She has an appt on 12/30 at which time we can do follow up labs as recommended by [facility]. I updated her appt notes to reflect this.   For GDM, she was on SSI (it is a new diagnosis). She is on a short course of oral steroids, but will only continue for 2 more days. If she was going to remain on steroids, inpt  diabetes team recommended insulin . Since the course will be short, will hold on home insulin  at this time. Growth has been normal. She has next US  on 06/27/24 with MFM.   For her abdominal pain she has had this throughout her pregnancy in the setting of her 20 cm hernia. General surgery was previously consulted and recommend the patient have delivery at tertiary care center. She previously delivered at [facility] and had a history of bowel injury during that c-section. We discussed in detail the importance of her having delivery at an tertiary care center for these reasons. She voiced understanding. Patient reports she does not drive and takes an ambulance. We discussed she can request transport to [facility].   She notes intense foot itching - foot had red rash on it (left foot). Topical medication given for home.   For her history of PE, she was on Lovenox  during her hospitalization and this will be continued at home.   I did call [facility] to see about inpatient transfer to help facilitate her care. Dr. Mathew and charge nurse discussed the patient with me and due to critical bed shortage, declined the transfer. They recommended outpatient transfer. We did discuss that we have made multiple calls on her behalf to their office/nurse navigator and have not heard back. I have sent another message to [facility] clinical pool to try again to help arrange care for this patient.   She was deemed stable for discharge to home with outpatient follow up.  Discharge Exam: Temp:  [97.4 F (  36.3 C)-98.3 F (36.8 C)] 98.2 F (36.8 C) (12/24 1159) Pulse Rate:  [68-81] 76 (12/24 1159) Resp:  [19-20] 20 (12/24 1159) BP: (108-135)/(44-77) 123/64 (12/24 1159) SpO2:  [91 %-100 %] 94 % (12/24 1159)  Physical Examination: CONSTITUTIONAL: Well-developed, well-nourished female in no acute distress.  HENT:  Normocephalic, atraumatic, External right and left ear normal.  EYES: Conjunctivae and EOM are normal. Pupils are equal, round,  and reactive to light. No scleral icterus.  NECK: Normal range of motion, supple, no masses SKIN: Skin is warm and dry. No rash noted except on right foot which appeared with a fungal foot infection (red, itchy, satellite like lesions). Not diaphoretic. No erythema. No pallor. NEUROLOGIC: Alert and oriented to person, place, and time. Normal reflexes, muscle tone coordination. No cranial nerve deficit noted. PSYCHIATRIC: Normal mood and affect. Normal behavior. Normal judgment and thought content. CARDIOVASCULAR: Normal heart rate noted RESPIRATORY: Effort normal, no problems with respiration noted - breathing comfortably on RA MUSCULOSKELETAL: Normal range of motion. No edema and no tenderness. 2+ distal pulses. ABDOMEN: Not examined  Fetal monitoringBaseline: 140 bpm, Variability: Good {> 6 bpm), Accelerations: Reactive, and Decelerations: Absent Uterine activityNone  Significant Diagnostic Studies:  Results for orders placed or performed during the hospital encounter of 06/14/24 (from the past week)  Protein / creatinine ratio, urine   Collection Time: 06/14/24  8:11 PM  Result Value Ref Range   Creatinine, Urine 165 mg/dL   Total Protein, Urine 23 mg/dL   Protein Creatinine Ratio 0.1 <0.2 mg/mg  Urinalysis, Routine w reflex microscopic -Urine, Clean Catch   Collection Time: 06/14/24  8:11 PM  Result Value Ref Range   Color, Urine YELLOW YELLOW   APPearance CLEAR CLEAR   Specific Gravity, Urine 1.020 1.005 - 1.030   pH 7.0 5.0 - 8.0   Glucose, UA NEGATIVE NEGATIVE mg/dL   Hgb urine dipstick NEGATIVE NEGATIVE   Bilirubin Urine NEGATIVE NEGATIVE   Ketones, ur NEGATIVE NEGATIVE mg/dL   Protein, ur NEGATIVE NEGATIVE mg/dL   Nitrite NEGATIVE NEGATIVE   Leukocytes,Ua NEGATIVE NEGATIVE   RBC / HPF 0-5 0 - 5 RBC/hpf   WBC, UA 0-5 0 - 5 WBC/hpf   Bacteria, UA RARE (A) NONE SEEN   Squamous Epithelial / HPF 6-10 0 - 5 /HPF   Mucus PRESENT   CBC   Collection Time: 06/14/24  8:30 PM   Result Value Ref Range   WBC 11.7 (H) 4.0 - 10.5 K/uL   RBC 3.99 3.87 - 5.11 MIL/uL   Hemoglobin 10.8 (L) 12.0 - 15.0 g/dL   HCT 66.3 (L) 63.9 - 53.9 %   MCV 84.2 80.0 - 100.0 fL   MCH 27.1 26.0 - 34.0 pg   MCHC 32.1 30.0 - 36.0 g/dL   RDW 82.5 (H) 88.4 - 84.4 %   Platelets 227 150 - 400 K/uL   nRBC 0.0 0.0 - 0.2 %  Comprehensive metabolic panel with GFR   Collection Time: 06/14/24  8:30 PM  Result Value Ref Range   Sodium 135 135 - 145 mmol/L   Potassium 4.8 3.5 - 5.1 mmol/L   Chloride 102 98 - 111 mmol/L   CO2 25 22 - 32 mmol/L   Glucose, Bld 100 (H) 70 - 99 mg/dL   BUN 8 6 - 20 mg/dL   Creatinine, Ser 9.42 0.44 - 1.00 mg/dL   Calcium  9.1 8.9 - 10.3 mg/dL   Total Protein 6.2 (L) 6.5 - 8.1 g/dL   Albumin 2.9 (L) 3.5 -  5.0 g/dL   AST 17 15 - 41 U/L   ALT 9 0 - 44 U/L   Alkaline Phosphatase 82 38 - 126 U/L   Total Bilirubin 0.2 0.0 - 1.2 mg/dL   GFR, Estimated >39 >39 mL/min   Anion gap 8 5 - 15  Pro Brain natriuretic peptide   Collection Time: 06/14/24  8:30 PM  Result Value Ref Range   Pro Brain Natriuretic Peptide <50.0 <300.0 pg/mL  Troponin T, High Sensitivity   Collection Time: 06/14/24  9:29 PM  Result Value Ref Range   Troponin T High Sensitivity <15 0 - 19 ng/L  Type and screen [facility]   Collection Time: 06/15/24  7:13 AM  Result Value Ref Range   ABO/RH(D) A POS    Antibody Screen NEG    Sample Expiration      06/18/2024,2359 Performed at [facility], [address], [place], [place] [address]   Lipase, blood   Collection Time: 06/15/24  7:18 AM  Result Value Ref Range   Lipase 18 11 - 51 U/L  Amylase   Collection Time: 06/15/24  7:18 AM  Result Value Ref Range   Amylase 37 28 - 100 U/L  Fibrinogen    Collection Time: 06/15/24  7:18 AM  Result Value Ref Range   Fibrinogen  632 (H) 210 - 475 mg/dL  Glucose, capillary   Collection Time: 06/15/24 11:28 AM  Result Value Ref Range   Glucose-Capillary 89 70 - 99 mg/dL  SARS  Coronavirus 2 by RT PCR (hospital order, performed in [facility] hospital lab) *cepheid single result test* Anterior Nasal Swab   Collection Time: 06/15/24 12:19 PM   Specimen: Anterior Nasal Swab  Result Value Ref Range   SARS Coronavirus 2 by RT PCR NEGATIVE NEGATIVE  Respiratory (~20 pathogens) panel by PCR   Collection Time: 06/15/24 12:19 PM   Specimen: Nasopharyngeal Swab; Respiratory  Result Value Ref Range   Adenovirus NOT DETECTED NOT DETECTED   Coronavirus 229E NOT DETECTED NOT DETECTED   Coronavirus HKU1 NOT DETECTED NOT DETECTED   Coronavirus NL63 NOT DETECTED NOT DETECTED   Coronavirus OC43 NOT DETECTED NOT DETECTED   Metapneumovirus NOT DETECTED NOT DETECTED   Rhinovirus / Enterovirus DETECTED (A) NOT DETECTED   Influenza A NOT DETECTED NOT DETECTED   Influenza B NOT DETECTED NOT DETECTED   Parainfluenza Virus 1 NOT DETECTED NOT DETECTED   Parainfluenza Virus 2 NOT DETECTED NOT DETECTED   Parainfluenza Virus 3 NOT DETECTED NOT DETECTED   Parainfluenza Virus 4 NOT DETECTED NOT DETECTED   Respiratory Syncytial Virus NOT DETECTED NOT DETECTED   Bordetella pertussis NOT DETECTED NOT DETECTED   Bordetella Parapertussis NOT DETECTED NOT DETECTED   Chlamydophila pneumoniae NOT DETECTED NOT DETECTED   Mycoplasma pneumoniae NOT DETECTED NOT DETECTED  Glucose, capillary   Collection Time: 06/15/24  2:52 PM  Result Value Ref Range   Glucose-Capillary 114 (H) 70 - 99 mg/dL  Glucose, capillary   Collection Time: 06/15/24  6:36 PM  Result Value Ref Range   Glucose-Capillary 182 (H) 70 - 99 mg/dL  Comprehensive metabolic panel   Collection Time: 06/16/24  4:59 AM  Result Value Ref Range   Sodium 134 (L) 135 - 145 mmol/L   Potassium 4.4 3.5 - 5.1 mmol/L   Chloride 99 98 - 111 mmol/L   CO2 26 22 - 32 mmol/L   Glucose, Bld 131 (H) 70 - 99 mg/dL   BUN 14 6 - 20 mg/dL  Creatinine, Ser 0.70 0.44 - 1.00 mg/dL   Calcium  8.9 8.9 - 10.3 mg/dL   Total Protein 6.2 (L) 6.5 - 8.1  g/dL   Albumin 3.1 (L) 3.5 - 5.0 g/dL   AST 18 15 - 41 U/L   ALT 12 0 - 44 U/L   Alkaline Phosphatase 75 38 - 126 U/L   Total Bilirubin <0.2 0.0 - 1.2 mg/dL   GFR, Estimated >39 >39 mL/min   Anion gap 9 5 - 15  CBC   Collection Time: 06/16/24  4:59 AM  Result Value Ref Range   WBC 10.0 4.0 - 10.5 K/uL   RBC 3.60 (L) 3.87 - 5.11 MIL/uL   Hemoglobin 9.8 (L) 12.0 - 15.0 g/dL   HCT 69.3 (L) 63.9 - 53.9 %   MCV 85.0 80.0 - 100.0 fL   MCH 27.2 26.0 - 34.0 pg   MCHC 32.0 30.0 - 36.0 g/dL   RDW 82.1 (H) 88.4 - 84.4 %   Platelets 217 150 - 400 K/uL   nRBC 0.3 (H) 0.0 - 0.2 %  Glucose, capillary   Collection Time: 06/16/24  8:19 AM  Result Value Ref Range   Glucose-Capillary 93 70 - 99 mg/dL  Glucose, capillary   Collection Time: 06/16/24 12:45 PM  Result Value Ref Range   Glucose-Capillary 143 (H) 70 - 99 mg/dL  Glucose, capillary   Collection Time: 06/16/24  4:51 PM  Result Value Ref Range   Glucose-Capillary 140 (H) 70 - 99 mg/dL  Glucose, capillary   Collection Time: 06/16/24  8:09 PM  Result Value Ref Range   Glucose-Capillary 151 (H) 70 - 99 mg/dL  CBC with Differential/Platelet   Collection Time: 06/17/24  4:51 AM  Result Value Ref Range   WBC 12.0 (H) 4.0 - 10.5 K/uL   RBC 3.39 (L) 3.87 - 5.11 MIL/uL   Hemoglobin 9.5 (L) 12.0 - 15.0 g/dL   HCT 69.7 (L) 63.9 - 53.9 %   MCV 89.1 80.0 - 100.0 fL   MCH 28.0 26.0 - 34.0 pg   MCHC 31.5 30.0 - 36.0 g/dL   RDW 82.0 (H) 88.4 - 84.4 %   Platelets 202 150 - 400 K/uL   nRBC 0.2 0.0 - 0.2 %   Neutrophils Relative % 76 %   Neutro Abs 9.1 (H) 1.7 - 7.7 K/uL   Lymphocytes Relative 17 %   Lymphs Abs 2.0 0.7 - 4.0 K/uL   Monocytes Relative 4 %   Monocytes Absolute 0.5 0.1 - 1.0 K/uL   Eosinophils Relative 1 %   Eosinophils Absolute 0.1 0.0 - 0.5 K/uL   Basophils Relative 0 %   Basophils Absolute 0.0 0.0 - 0.1 K/uL   Immature Granulocytes 2 %   Abs Immature Granulocytes 0.24 (H) 0.00 - 0.07 K/uL  Basic metabolic panel with  GFR   Collection Time: 06/17/24  4:51 AM  Result Value Ref Range   Sodium 135 135 - 145 mmol/L   Potassium 3.9 3.5 - 5.1 mmol/L   Chloride 98 98 - 111 mmol/L   CO2 28 22 - 32 mmol/L   Glucose, Bld 124 (H) 70 - 99 mg/dL   BUN 16 6 - 20 mg/dL   Creatinine, Ser 9.22 0.44 - 1.00 mg/dL   Calcium  8.9 8.9 - 10.3 mg/dL   GFR, Estimated >39 >39 mL/min   Anion gap 9 5 - 15  Glucose, capillary   Collection Time: 06/17/24  6:52 AM  Result Value Ref Range   Glucose-Capillary 128 (  H) 70 - 99 mg/dL  Glucose, capillary   Collection Time: 06/17/24  9:00 AM  Result Value Ref Range   Glucose-Capillary 99 70 - 99 mg/dL  Glucose, capillary   Collection Time: 06/17/24  2:02 PM  Result Value Ref Range   Glucose-Capillary 151 (H) 70 - 99 mg/dL  Glucose, capillary   Collection Time: 06/17/24  5:47 PM  Result Value Ref Range   Glucose-Capillary 195 (H) 70 - 99 mg/dL  Basic metabolic panel with GFR   Collection Time: 06/18/24  4:46 AM  Result Value Ref Range   Sodium 136 135 - 145 mmol/L   Potassium 3.7 3.5 - 5.1 mmol/L   Chloride 98 98 - 111 mmol/L   CO2 29 22 - 32 mmol/L   Glucose, Bld 93 70 - 99 mg/dL   BUN 17 6 - 20 mg/dL   Creatinine, Ser 9.36 0.44 - 1.00 mg/dL   Calcium  9.2 8.9 - 10.3 mg/dL   GFR, Estimated >39 >39 mL/min   Anion gap 9 5 - 15  Glucose, capillary   Collection Time: 06/18/24  5:10 AM  Result Value Ref Range   Glucose-Capillary 93 70 - 99 mg/dL  Glucose, capillary   Collection Time: 06/18/24  9:11 AM  Result Value Ref Range   Glucose-Capillary 118 (H) 70 - 99 mg/dL  Results for orders placed or performed in visit on 06/13/24 (from the past week)  RPR W/RFLX TO RPR TITER, TREPONEMAL AB, SCREEN AND DIAGNOSIS   Collection Time: 06/13/24  8:15 AM  Result Value Ref Range   RPR Ser Ql Non Reactive Non Reactive  HIV Antibody (routine testing w rflx)   Collection Time: 06/13/24  8:15 AM  Result Value Ref Range   HIV Screen 4th Generation wRfx Non Reactive Non Reactive   Glucose Tolerance, 2 Hours w/1 Hour   Collection Time: 06/13/24  8:15 AM  Result Value Ref Range   Glucose, Fasting 119 (H) 70 - 91 mg/dL   Glucose, 1 hour 829 70 - 179 mg/dL   Glucose, 2 hour 89 70 - 152 mg/dL  CBC   Collection Time: 06/13/24  8:15 AM  Result Value Ref Range   WBC 9.5 3.4 - 10.8 x10E3/uL   RBC 3.86 3.77 - 5.28 x10E6/uL   Hemoglobin 10.3 (L) 11.1 - 15.9 g/dL   Hematocrit 66.0 (L) 65.9 - 46.6 %   MCV 88 79 - 97 fL   MCH 26.7 26.6 - 33.0 pg   MCHC 30.4 (L) 31.5 - 35.7 g/dL   RDW 82.6 (H) 88.2 - 84.5 %   Platelets 220 150 - 450 x10E3/uL  Results for orders placed or performed in visit on 06/13/24 (from the past week)  Comp Met (CMET)   Collection Time: 06/13/24 10:52 AM  Result Value Ref Range   Glucose 165 (H) 70 - 99 mg/dL   BUN 6 6 - 24 mg/dL   Creatinine, Ser 9.35 0.57 - 1.00 mg/dL   eGFR 885 >40 fO/fpw/8.26   BUN/Creatinine Ratio 9 9 - 23   Sodium 137 134 - 144 mmol/L   Potassium 4.3 3.5 - 5.2 mmol/L   Chloride 99 96 - 106 mmol/L   CO2 23 20 - 29 mmol/L   Calcium  8.6 (L) 8.7 - 10.2 mg/dL   Total Protein 5.6 (L) 6.0 - 8.5 g/dL   Albumin 3.0 (L) 3.9 - 4.9 g/dL   Globulin, Total 2.6 1.5 - 4.5 g/dL   Bilirubin Total <9.7 0.0 - 1.2 mg/dL  Alkaline Phosphatase 81 41 - 116 IU/L   AST 11 0 - 40 IU/L   ALT 10 0 - 32 IU/L   US  MFM FETAL BPP WO NON STRESS Result Date: 06/15/2024 ----------------------------------------------------------------------  OBSTETRICS REPORT                        (Signed Final 06/15/2024 10:06 pm) ---------------------------------------------------------------------- Patient Info  ID #:       [id number]                          D.O.B.:  Apr 12, 1986 (38 yrs)(F)  Name:       HARLENE Kirschenbaum                  Visit Date: 06/15/2024 07:15 am ---------------------------------------------------------------------- Performed By  Attending:        Nathanel Fetters      Secondary Phy.:    [facility]                    MD                                                               Care  Performed By:     Metta Chang RDMS         [facility]  Referred By:      [facility] ---------------------------------------------------------------------- Orders  #  Description                           Code        Ordered By  1  US  MFM FETAL BPP WO NON               23180.98    KATHLEEN DUNN     STRESS ----------------------------------------------------------------------  #  Order #                     Accession #                Episode #  1  [id number]                   [id number]                 [id number] ---------------------------------------------------------------------- Indications  Decreased fetal movement                        O36.8190  Abdominal pain in pregnancy                     O99.89  Obesity complicating pregnancy, second          O99.212  trimester (BMI 57)  [redacted] weeks gestation of pregnancy  Z3A.28 ---------------------------------------------------------------------- Fetal Evaluation  Num Of Fetuses:          1  Cardiac Activity:        Observed  Presentation:            Cephalic  Placenta:                Anterior  Amniotic Fluid  AFI FV:      Within normal limits                              Largest Pocket(cm)                              6 ---------------------------------------------------------------------- Biophysical Evaluation  Amniotic F.V:   Pocket => 2 cm             F. Tone:         Observed  F. Movement:    Observed                   Score:           8/8  F. Breathing:   Observed ---------------------------------------------------------------------- OB History  Gravidity:    9         Term:   0        Prem:   7        SAB:   1  TOP:          0       Ectopic:  0        Living: 5 ---------------------------------------------------------------------- Gestational Age  Best:          28w 1d     Det. By:  Early Ultrasound         EDD:    09/06/24                                      (01/22/24) ---------------------------------------------------------------------- Impression  Antenatal testing performed given decreased fetal movement.  The biophysical profile was 8/8 with good fetal movement and  amniotic fluid volume. ---------------------------------------------------------------------- Recommendations  Clinical correlation recommended. ----------------------------------------------------------------------               Nathanel Fetters, MD Electronically Signed Final Report   06/15/2024 10:06 pm ----------------------------------------------------------------------   US  MFM Fetal BPP Wo Non Stress Result Date: 06/15/2024 ----------------------------------------------------------------------  OBSTETRICS REPORT                    (Corrected Final 06/15/2024 09:37 pm) ---------------------------------------------------------------------- Patient Info  ID #:       [id number]                          D.O.B.:  1985-12-02 (38 yrs)(F)  Name:       HARLENE Coppinger                  Visit Date: 06/14/2024 10:17 pm ---------------------------------------------------------------------- Performed By  Attending:        Nathanel Fetters      Referred By:       [facility] MAU/Triage                    MD  Performed By:     Nat GORMAN Plant  Location[facility]                    BS, RDMS                                  [facility] ---------------------------------------------------------------------- Orders  #  Description                           Code        Ordered By  1  US  MFM FETAL BPP WO NON               76819.01    LISA LEFTWICH-     STRESS                                            KIRBY ----------------------------------------------------------------------  #  Order #                     Accession #                Episode #  1  [id number]                   [id number]                 [id number]  ---------------------------------------------------------------------- Indications  Abdominal pain in pregnancy                     O99.89  [redacted] weeks gestation of pregnancy                 Z3A.28  Decreased fetal movement                        O36.8190  Obesity complicating pregnancy, second          O99.212  trimester (BMI 57) ---------------------------------------------------------------------- Fetal Evaluation  Num Of Fetuses:          1  Fetal Heart Rate(bpm):   133  Cardiac Activity:        Observed  Presentation:            Cephalic  Placenta:                Anterior  P. Cord Insertion:       Previously seen  Amniotic Fluid  AFI FV:      Within normal limits                              Largest Pocket(cm)                              7.6 ---------------------------------------------------------------------- Biophysical Evaluation  Amniotic F.V:   Within normal limits       F. Tone:         Observed  F. Movement:    Observed                   Score:           6/8  F. Breathing:   Not Observed ---------------------------------------------------------------------- OB  History  Gravidity:    9         Term:   0        Prem:   7        SAB:   1  TOP:          0       Ectopic:  0        Living: 5 ---------------------------------------------------------------------- Gestational Age  Best:          28w 0d     Det. By:  Early Ultrasound         EDD:   09/06/24                                      (01/22/24) ---------------------------------------------------------------------- Impression  Antenatal testing performed given decreased fetal movement.  The biophysical profile was 6/8 with good fetal movement and  amniotic fluid volume ---------------------------------------------------------------------- Recommendations  Clinical correlation recommended. ----------------------------------------------------------------------                    Nathanel Fetters, MD Electronically Signed Corrected Final Report  06/15/2024  09:37 pm ----------------------------------------------------------------------   CT ABDOMEN PELVIS W CONTRAST Result Date: 06/15/2024 CLINICAL DATA:  Acute abdominal pain.  Known hernia. EXAM: CT ABDOMEN AND PELVIS WITH CONTRAST TECHNIQUE: Multidetector CT imaging of the abdomen and pelvis was performed using the standard protocol following bolus administration of intravenous contrast. RADIATION DOSE REDUCTION: This exam was performed according to the departmental dose-optimization program which includes automated exposure control, adjustment of the mA and/or kV according to patient size and/or use of iterative reconstruction technique. CONTRAST:  OMNIPAQUE  IOHEXOL  350 MG/ML SOLN COMPARISON:  CT 05/29/2024 FINDINGS: Lower chest: Assessed on concurrent chest CT, reported separately. Hepatobiliary: The liver is enlarged spanning 23 cm cranial caudal with mild steatosis. No focal liver lesion. Decompressed gallbladder. No calcified gallstone. No biliary dilatation. Pancreas: No ductal dilatation or inflammation. Spleen: Enlarged, 13.7 cm AP.  No focal abnormality. Adrenals/Urinary Tract: No adrenal nodule. No hydronephrosis. No renal calculi. No evidence of focal renal lesion. Partially distended urinary bladder, normal for degree of distension. Stomach/Bowel: The stomach is distended with ingested material. Large right lower abdominal ventral abdominal wall hernia contains nonobstructed noninflamed small bowel. Hernia size is similar to prior but cannot be accurately assessed. The anterolateral portion of the hernia is not included in the field of view due to habitus. No small bowel wall thickening, mesenteric inflammation or obstruction. Moderate volume of stool throughout the colon. No colonic inflammation. Vascular/Lymphatic: Normal caliber abdominal aorta. Patent portal, splenic and mesenteric veins. No bulky lymphadenopathy. Reproductive: Gravid uterus. The fetus is in cephalic presentation. No adnexal  mass. Other: No free air or free fluid. Generalized body wall edema which is increased from prior exam. Left lower ventral abdominal wall hernia contains only fat. Musculoskeletal: There are no acute or suspicious osseous abnormalities. IMPRESSION: 1. Large right lower abdominal ventral abdominal wall hernia containing nonobstructed noninflamed small bowel. Hernia size is similar to prior but cannot be accurately assessed, the anterolateral portion of the hernia is not included in the field of view due to habitus. 2. Gravid uterus. The fetus is in cephalic presentation. 3. Hepatosplenomegaly and hepatic steatosis. 4. Generalized body wall edema which is increased from prior exam. Electronically Signed   By: Andrea Gasman M.D.   On: 06/15/2024 18:12   CT Angio Chest Pulmonary Embolism (PE) W  or WO Contrast Result Date: 06/15/2024 CLINICAL DATA:  Provided history: Dyspnea, chronic, chest wall or pleura disease suspected EXAM: CT ANGIOGRAPHY CHEST WITH CONTRAST TECHNIQUE: Multidetector CT imaging of the chest was performed using the standard protocol during bolus administration of intravenous contrast. Multiplanar CT image reconstructions and MIPs were obtained to evaluate the vascular anatomy. RADIATION DOSE REDUCTION: This exam was performed according to the departmental dose-optimization program which includes automated exposure control, adjustment of the mA and/or kV according to patient size and/or use of iterative reconstruction technique. CONTRAST:  OMNIPAQUE  IOHEXOL  350 MG/ML SOLN COMPARISON:  Radiograph earlier today.  Chest CTA 05/29/2024 FINDINGS: Cardiovascular: There are no filling defects within the pulmonary arteries to suggest pulmonary embolus. Normal caliber thoracic aorta without acute aortic findings. The heart is upper normal in size. No pericardial effusion. Mediastinum/Nodes: No mediastinal or hilar lymphadenopathy. Unremarkable appearance of the esophagus. Lungs/Pleura: Mild  heterogeneous pulmonary parenchyma. Subsegmental atelectasis in the left lower lobe. No consolidative airspace disease. No features of pulmonary edema. No pleural effusion. The trachea and central airways are clear. Upper Abdomen: Assessed on concurrent abdominopelvic CT, reported separately Musculoskeletal: Degenerative change in the spine. There are no acute or suspicious osseous abnormalities. Soft tissue attenuation from habitus limits detailed assessment. Review of the MIP images confirms the above findings. IMPRESSION: 1. No pulmonary embolus. 2. Mild heterogeneous pulmonary parenchyma, can be seen with small airways disease. 3. Subsegmental atelectasis in the left lower lobe. Electronically Signed   By: Andrea Gasman M.D.   On: 06/15/2024 18:05   DG Chest Portable 1 View Result Date: 06/15/2024 EXAM: 1 VIEW(S) XRAY OF THE CHEST 06/15/2024 06:02:25 AM COMPARISON: 05/27/2024 CLINICAL HISTORY: RUQ pain, recent fall, eval ribs FINDINGS: LUNGS AND PLEURA: Low lung volumes. Bronchovascular crowding versus mild pulmonary edema. No pleural effusion. No pneumothorax. HEART AND MEDIASTINUM: No acute abnormality of the cardiac and mediastinal silhouettes. BONES AND SOFT TISSUES: No acute osseous abnormality. IMPRESSION: 1. Bronchovascular crowding versus mild pulmonary edema. Electronically signed by: Waddell Calk MD 06/15/2024 06:11 AM EST RP Workstation: [id number]   DG Elbow 2 Views Right Result Date: 06/14/2024 EXAM: 1 or 2 VIEW(S) XRAY OF THE RIGHT ELBOW COMPARISON: 07/24/2020 CLINICAL HISTORY: right arm pain, fall FINDINGS: BONES AND JOINTS: Old healed distal humeral diaphyseal fracture partially visualized. No malalignment. SOFT TISSUES: The soft tissues are unremarkable. IMPRESSION: 1. No acute fracture or dislocation. 2. Old healed distal humeral diaphyseal fracture partially visualized. Electronically signed by: Oneil Devonshire MD 06/14/2024 11:38 PM EST RP Workstation: [id number]   US  Abdomen  Limited RUQ (LIVER/GB) Result Date: 06/14/2024 EXAM: Right Upper Quadrant Abdominal Ultrasound 06/14/2024 11:00:06 PM TECHNIQUE: Real-time ultrasonography of the right upper quadrant of the abdomen was performed. COMPARISON: None available. CLINICAL HISTORY: RUQ abdominal pain. FINDINGS: LIVER: Normal echogenicity. No intrahepatic biliary ductal dilatation. No evidence of mass. Patent portal vein with antegrade flow. BILIARY SYSTEM: No pericholecystic fluid or wall thickening. No cholelithiasis. Common bile duct measures 3 mm. Patient very tender to transducer pressure in the right upper quadrant. OTHER: No right upper quadrant ascites. IMPRESSION: 1. Right upper quadrant tenderness with transducer pressure. However, there is no wall thickening, pericholecystic fluid, or cholelithiasis. No biliary dilation. Electronically signed by: Norman Gatlin MD 06/14/2024 11:08 PM EST RP Workstation: [id number]   CT ABDOMEN PELVIS W CONTRAST Result Date: 05/30/2024 EXAM: CT ABDOMEN AND PELVIS WITH CONTRAST 05/29/2024 03:29:00 PM TECHNIQUE: CT of the abdomen and pelvis was performed with the administration of 75 mL iohexol  (OMNIPAQUE ) 350 MG/ML injection. Multiplanar reformatted  images are provided for review. Automated exposure control, iterative reconstruction, and/or weight-based adjustment of the mA/kV was utilized to reduce the radiation dose to as low as reasonably achievable. COMPARISON: Findings are compared to 07/24/2020. CLINICAL HISTORY: Hernia suspected, abdominal wall. FINDINGS: LOWER CHEST: No acute abnormality. LIVER: The liver is unremarkable. GALLBLADDER AND BILE DUCTS: Gallbladder is unremarkable. No biliary ductal dilatation. SPLEEN: No acute abnormality. PANCREAS: No acute abnormality. ADRENAL GLANDS: No acute abnormality. KIDNEYS, URETERS AND BLADDER: No stones in the kidneys or ureters. No hydronephrosis. No perinephric or periureteral stranding. Urinary bladder is unremarkable. GI AND BOWEL: A large  right parasagittal infraumbilical abdominal wall hernia is again identified containing multiple unremarkable loops of small bowel. This appears similar to the prior examination, with the hernia defect measuring 6.3 x 9.4 cm and the hernia sac itself measuring 17.1 x 20.8 x 15.5 cm in greatest dimension. A broad-based, shallow, small left parasagittal infraumbilical fat-containing ventral hernia, is also noted . The stomach, small bowel, and large bowel are otherwise unremarkable. There is no bowel obstruction. PERITONEUM AND RETROPERITONEUM: No ascites. No free air. VASCULATURE: IS ALSO NOTED. Aorta is normal in caliber. LYMPH NODES: No lymphadenopathy. REPRODUCTIVE ORGANS: Single fetus in cephalic presentation within the gravid uterus. BONES AND SOFT TISSUES: No acute osseous abnormality. No focal soft tissue abnormality. IMPRESSION: 1. Large right parasagittal infraumbilical abdominal wall hernia containing multiple unremarkable loops of small bowel, similar to the prior examination. 2. Small left parasagittal infraumbilical fat-containing ventral hernia. Electronically signed by: Dorethia Molt MD 05/30/2024 04:34 AM EST RP Workstation: [id number]   US  MFM OB LIMITED Result Date: 05/29/2024 ----------------------------------------------------------------------  OBSTETRICS REPORT                       (Signed Final 05/29/2024 03:56 pm) ---------------------------------------------------------------------- Patient Info  ID #:       [id number]                          D.O.B.:  [birth year] (38 yrs)(F)  Name:       HARLENE Lecomte                  Visit Date: 05/29/2024 10:17 am ---------------------------------------------------------------------- Performed By  Attending:        Delora Smaller DO       Ref. Address:     [address]                                                             [place], [place]                                                             [address]  Performed By:     Burnard LITTIE Custard          Secondary  Phy.:   [facility]  Referred By:      GLENYS GORMAN BIRK          [facility] ---------------------------------------------------------------------- Orders  #  Description                           Code        Ordered By  1  US  MFM OB LIMITED                     76815.01    TANYA PRATT ----------------------------------------------------------------------  #  Order #                     Accession #                Episode #  1  [id number]                   [id number]                 [id number] ---------------------------------------------------------------------- Indications  Decreased fetal movement                       O36.8190  [redacted] weeks gestation of pregnancy                Z3A.25  Unable to hear fetal heart tones as reason     O76  for ultrasound  Obesity complicating pregnancy, second         O99.212  trimester (BMI 57) ---------------------------------------------------------------------- Fetal Evaluation  Num Of Fetuses:         1  Fetal Heart Rate(bpm):  140  Cardiac Activity:       Observed  Presentation:           Cephalic  Placenta:               Anterior  Amniotic Fluid  AFI FV:      Within normal limits                              Largest Pocket(cm)                              5.3  Comment:    Good fetal movement seen. ---------------------------------------------------------------------- OB History  Gravidity:    9         Term:   0        Prem:   7        SAB:   1  TOP:          0       Ectopic:  0        Living: 5 ---------------------------------------------------------------------- Gestational Age  Best:          25w 5d     Det. By:  Early Ultrasound         EDD:   09/06/24                                      (  01/22/24) ---------------------------------------------------------------------- Anatomy  Stomach:                Appears normal, left   Bladder:                Appears normal                         sided ---------------------------------------------------------------------- Cervix Uterus Adnexa  Cervix  Not visualized (advanced GA >24wks)  Adnexa  No abnormality visualized ---------------------------------------------------------------------- Comments  Hospital Ultrasound  The patient presented to the MAU for decreased FM. US  was  ordered due to inability to find the fetal heart rate likely due to  maternal body habitus.  Sonographic findings  Single intrauterine pregnancy at 25w 5d  Fetal cardiac activity: Observed and appears normal.  Presentation: Cephalic.  Amniotic fluid volume: Within normal limits. MVP: 5.3 cm.  Placenta: Anterior.  Recommendations  - Continue clinical management per OB provider.  This was a limited ultrasound with a remote read. If an official  MFM consult is requested for any reason please call/place an  order in Epic. ----------------------------------------------------------------------                  Delora Smaller, DO Electronically Signed Final Report   05/29/2024 03:56 pm ----------------------------------------------------------------------   CT Angio Chest Pulmonary Embolism (PE) W or WO Contrast Result Date: 05/29/2024 EXAM: CTA of the Chest with contrast for PE 05/29/2024 03:29:00 PM TECHNIQUE: CTA of the chest was performed after the administration of 75 mL of iohexol  (OMNIPAQUE ) 350 MG/ML injection. Multiplanar reformatted images are provided for review. MIP images are provided for review. Automated exposure control, iterative reconstruction, and/or weight based adjustment of the mA/kV was utilized to reduce the radiation dose to as low as reasonably achievable. COMPARISON: None available. CLINICAL HISTORY: Pulmonary embolism (PE) suspected, high prob. FINDINGS: PULMONARY ARTERIES: Limited exam due to soft tissue attenuation artifact secondary to body habitus. There is no definite  evidence of large pulmonary embolus seen in the main pulmonary artery or the proximal portions of the left and right pulmonary arteries. There is limited evaluation of the more peripheral branches due to above artifact and therefore smaller and more peripheral pulmonary emboli cannot be excluded on the basis of this exam. Main pulmonary artery is normal in caliber. MEDIASTINUM: The heart and pericardium demonstrate no acute abnormality. There is no acute abnormality of the thoracic aorta. LYMPH NODES: No mediastinal, hilar or axillary lymphadenopathy. LUNGS AND PLEURA: The lungs are without acute process. No focal consolidation or pulmonary edema. No pleural effusion or pneumothorax. UPPER ABDOMEN: Limited images of the upper abdomen are unremarkable. SOFT TISSUES AND BONES: No acute bone or soft tissue abnormality. IMPRESSION: 1. No definite evidence of large pulmonary embolus in the main pulmonary artery or proximal portions of the left and right pulmonary arteries. 2. Evaluation of the peripheral pulmonary arterial branches is limited by soft tissue attenuation from body habitus, and smaller or more peripheral emboli cannot be excluded. Electronically signed by: Lynwood Seip MD 05/29/2024 03:51 PM EST RP Workstation: [id number]   VAS US  LOWER EXTREMITY VENOUS (DVT) Result Date: 05/28/2024  Lower Venous DVT Study Patient Name:  SENDY PLUTA  Date of Exam:   05/28/2024 Medical Rec #: [id number]       Accession #:    [id number] Date of Birth: 01/01/1986        Patient Gender: F Patient Age:   29 years Exam Location:  [facility] Procedure:  VAS US  LOWER EXTREMITY VENOUS (DVT) Referring Phys: SUZEN NEWTON --------------------------------------------------------------------------------  Indications: Pulmonary embolism. Other Indications: [redacted] weeks pregnant. Anticoagulation: Lovenox . Comparison Study: Previous study on the left lower extremity on 10.27.2023. Performing Technologist: Edilia Elden Appl   Examination Guidelines: A complete evaluation includes B-mode imaging, spectral Doppler, color Doppler, and power Doppler as needed of all accessible portions of each vessel. Bilateral testing is considered an integral part of a complete examination. Limited examinations for reoccurring indications may be performed as noted. The reflux portion of the exam is performed with the patient in reverse Trendelenburg.  +---------+---------------+---------+-----------+----------+----------------+ RIGHT    CompressibilityPhasicitySpontaneityPropertiesThrombus Aging   +---------+---------------+---------+-----------+----------+----------------+ CFV      Full           Yes      Yes                                   +---------+---------------+---------+-----------+----------+----------------+ SFJ      Full           Yes      Yes                                   +---------+---------------+---------+-----------+----------+----------------+ FV Prox  Full                                                          +---------+---------------+---------+-----------+----------+----------------+ FV Mid   Full                                                          +---------+---------------+---------+-----------+----------+----------------+ FV DistalFull                                                          +---------+---------------+---------+-----------+----------+----------------+ PFV      Full                                                          +---------+---------------+---------+-----------+----------+----------------+ POP      Full           Yes      Yes                                   +---------+---------------+---------+-----------+----------+----------------+ PTV                                                   Patent by color. +---------+---------------+---------+-----------+----------+----------------+ PERO  Patent by color. +---------+---------------+---------+-----------+----------+----------------+   +---------+---------------+---------+-----------+----------+----------------+ LEFT     CompressibilityPhasicitySpontaneityPropertiesThrombus Aging   +---------+---------------+---------+-----------+----------+----------------+ CFV      Full           Yes      Yes                                   +---------+---------------+---------+-----------+----------+----------------+ SFJ      Full                                                          +---------+---------------+---------+-----------+----------+----------------+ FV Prox  Full                                                          +---------+---------------+---------+-----------+----------+----------------+ FV Mid   Full                                                          +---------+---------------+---------+-----------+----------+----------------+ FV DistalFull                                                          +---------+---------------+---------+-----------+----------+----------------+ PFV      Full                                                          +---------+---------------+---------+-----------+----------+----------------+ POP      Full           Yes      Yes                                   +---------+---------------+---------+-----------+----------+----------------+ PTV                                                   Patent by color. +---------+---------------+---------+-----------+----------+----------------+ PERO                                                  Patent by color. +---------+---------------+---------+-----------+----------+----------------+     Summary: RIGHT: - There is no evidence of deep vein thrombosis in the lower extremity. However, portions of this examination were limited- see technologist comments above.  - No cystic structure found  in the popliteal fossa.  LEFT: - There is no evidence of deep vein thrombosis in the lower extremity. However, portions of this examination were limited- see technologist comments above.  - No cystic structure found in the popliteal fossa.  *See table(s) above for measurements and observations. Electronically signed by Debby Robertson on 05/28/2024 at 4:06:56 PM.    Final    ECHOCARDIOGRAM COMPLETE Result Date: 05/28/2024    ECHOCARDIOGRAM REPORT   Patient Name:   Marylen Babineau Date of Exam: 05/28/2024 Medical Rec #:  [id number]      Height:       67.0 in Accession #:    [id number]     Weight:       392.4 lb Date of Birth:  11-17-1985       BSA:          2.693 m Patient Age:    38 years       BP:           123/55 mmHg Patient Gender: F              HR:           96 bpm. Exam Location:  Inpatient Procedure: 2D Echo, Cardiac Doppler and Color Doppler (Both Spectral and Color            Flow Doppler were utilized during procedure). Indications:    Congestive Heart Failure I50.9  History:        Patient has no prior history of Echocardiogram examinations.                 CHF.  Sonographer:    Jayson Gaskins Referring Phys: [id number] [facility] NILES NEWTON  Sonographer Comments: No Definity used due to pregnancy. IMPRESSIONS  1. Left ventricular ejection fraction, by estimation, is 55 to 60%. The left ventricle has normal function. Left ventricular endocardial border not optimally defined to evaluate regional wall motion. Left ventricular diastolic parameters were normal.  2. Right ventricular systolic function is normal. The right ventricular size is normal.  3. The mitral valve is grossly normal. Trivial mitral valve regurgitation.  4. The aortic valve was not well visualized. Aortic valve regurgitation is not visualized. Comparison(s): Technically difficutl evaluation but overall normal echocardiogram. FINDINGS  Left Ventricle: Left ventricular ejection fraction, by estimation, is 55 to 60%. The left ventricle has normal  function. Left ventricular endocardial border not optimally defined to evaluate regional wall motion. The left ventricular internal cavity size was normal in size. There is borderline left ventricular hypertrophy. Left ventricular diastolic parameters were normal. Right Ventricle: The right ventricular size is normal. No increase in right ventricular wall thickness. Right ventricular systolic function is normal. Left Atrium: Left atrial size was normal in size. Right Atrium: Right atrial size was normal in size. Pericardium: There is no evidence of pericardial effusion. Presence of epicardial fat layer. Mitral Valve: The mitral valve is grossly normal. Trivial mitral valve regurgitation. Tricuspid Valve: The tricuspid valve is grossly normal. Tricuspid valve regurgitation is trivial. Aortic Valve: The aortic valve was not well visualized. Aortic valve regurgitation is not visualized. Aortic valve mean gradient measures 5.0 mmHg. Aortic valve peak gradient measures 8.3 mmHg. Aortic valve area, by VTI measures 3.37 cm. Pulmonic Valve: The pulmonic valve was not well visualized. Pulmonic valve regurgitation is not visualized. Aorta: The aortic root and ascending aorta are structurally normal, with no evidence of dilitation. IAS/Shunts: The interatrial septum was not well visualized.  LEFT VENTRICLE PLAX 2D LVIDd:  4.20 cm   Diastology LVIDs:         3.40 cm   LV e' medial:    7.40 cm/s LV PW:         1.20 cm   LV E/e' medial:  10.6 LV IVS:        0.90 cm   LV e' lateral:   11.20 cm/s LVOT diam:     2.00 cm   LV E/e' lateral: 7.0 LV SV:         89 LV SV Index:   33 LVOT Area:     3.14 cm  RIGHT VENTRICLE RV S prime:     13.40 cm/s TAPSE (M-mode): 2.4 cm LEFT ATRIUM             Index        RIGHT ATRIUM           Index LA Vol (A2C):   35.9 ml 13.33 ml/m  RA Area:     10.10 cm LA Vol (A4C):   51.1 ml 18.97 ml/m  RA Volume:   19.80 ml  7.35 ml/m LA Biplane Vol: 42.7 ml 15.86 ml/m  AORTIC VALVE AV Area (Vmax):     2.90 cm AV Area (Vmean):   2.92 cm AV Area (VTI):     3.37 cm AV Vmax:           144.00 cm/s AV Vmean:          112.000 cm/s AV VTI:            0.264 m AV Peak Grad:      8.3 mmHg AV Mean Grad:      5.0 mmHg LVOT Vmax:         133.00 cm/s LVOT Vmean:        104.000 cm/s LVOT VTI:          0.283 m LVOT/AV VTI ratio: 1.07  AORTA Ao Root diam: 2.70 cm Ao Asc diam:  3.70 cm MITRAL VALVE MV Area (PHT): 3.21 cm    SHUNTS MV Decel Time: 236 msec    Systemic VTI:  0.28 m MV E velocity: 78.10 cm/s  Systemic Diam: 2.00 cm MV A velocity: 91.20 cm/s MV E/A ratio:  0.86 Franck Azobou Tonleu Electronically signed by Joelle Cedars Tonleu Signature Date/Time: 05/28/2024/9:32:56 AM    Final    DG Chest Portable 1 View Result Date: 05/27/2024 CLINICAL DATA:  Shortness of breath.  Cough. EXAM: PORTABLE CHEST 1 VIEW COMPARISON:  Radiograph 05/15/2024 FINDINGS: The cardiomediastinal contours are normal. The lungs are clear. Pulmonary vasculature is normal. No consolidation, pleural effusion, or pneumothorax. No acute osseous abnormalities are seen. IMPRESSION: No acute findings. Electronically Signed   By: Andrea Gasman M.D.   On: 05/27/2024 19:47   US  MFM OB FOLLOW UP Result Date: 05/27/2024 ----------------------------------------------------------------------  OBSTETRICS REPORT                       (Signed Final 05/27/2024 02:43 pm) ---------------------------------------------------------------------- Patient Info  ID #:       [id number]                          D.O.B.:  [birth year] (38 yrs)(F)  Name:       Auda Umstead                  Visit Date: 05/27/2024 02:04 pm ---------------------------------------------------------------------- Performed By  Attending:  Corenthian Booker      Ref. Address:     [address]                    [place]                                                             [place], [place]                                                             [address]  Performed By:     Meade Parker        [facility]  Referred By:      [facility] ---------------------------------------------------------------------- Orders  #  Description                           Code        Ordered By  1  US  MFM OB FOLLOW UP                   [id number]    BABARA KEYS ----------------------------------------------------------------------  #  Order #                     Accession #                Episode #  1  [id number]                   [id number]                 [id number] ---------------------------------------------------------------------- Indications  [redacted] weeks gestation of pregnancy                Z3A.25  Abdominal pain in pregnancy  O99.89  Obesity complicating pregnancy, second         O99.212  trimester (BMI 49)  Advanced maternal age multigravida 74+,        O63.522  second trimester (38 yrs)  Previous cesarean delivery, antepartum (x5)    O63.219  Grand multiparity, antepartum                  O09.40  Poor obstetric history: Previous preterm       O09.219  delivery, antepartum (No term deliveries)  Pre-existing essential hypertension            O10.012  complicating pregnancy, second trimester  Medical complication of pregnancy (Hepatic     O26.90  steatosis, ventral hernia)(Hx of DVT, SUD,  CHF [Lasix ], bowel injury)  Poor obstetric history: Previous               O09.299  preeclampsia, GDM, PE (on Lovenox ),  HELLP  Protein S deficiency complicating pregnancy    O99.199  Poor obstetric history: Previous IUFD          O35.299  (stillbirth) x2, 24w 2007, 24w 2011)  Abnormal chromosomal and genetic finding       O28.5  on antenatal screening of mother (Low fetal  fraction on Panorama, LOW RISK [facility]  NIPS)  Encounter for other antenatal  screening        Z36.2  follow-up  Obesity complicating pregnancy, second         O99.212  trimester (BMI 57)  Encounter for other antenatal screening        Z36.2  follow-up ---------------------------------------------------------------------- Fetal Evaluation  Num Of Fetuses:         1  Cardiac Activity:       Observed  Presentation:           Transverse, head to maternal left  Placenta:               Anterior  P. Cord Insertion:      Previously seen  Amniotic Fluid  AFI FV:      Within normal limits                              Largest Pocket(cm)                              5.28 ---------------------------------------------------------------------- Biometry  BPD:      66.2  mm     G. Age:  26w 5d         82  %    CI:        80.28   %    70 - 86                                                          FL/HC:      20.3   %    18.7 - 20.3  HC:      233.4  mm     G. Age:  25w 3d         25  %    HC/AC:      1.09        1.04 - 1.22  AC:  214.3  mm     G. Age:  26w 0d         57  %    FL/BPD:     71.6   %    71 - 87  FL:       47.4  mm     G. Age:  25w 6d         49  %    FL/AC:      22.1   %    20 - 24  CER:      26.6  mm     G. Age:  23w 6d         14  %  LV:        4.8  mm  CM:        4.9  mm  Est. FW:     865  gm    1 lb 15 oz      60  % ---------------------------------------------------------------------- OB History  Gravidity:    9         Term:   0        Prem:   7        SAB:   1  TOP:          0       Ectopic:  0        Living: 5 ---------------------------------------------------------------------- Gestational Age  U/S Today:     26w 0d                                        EDD:   09/02/24  Best:          25w 3d     Det. By:  Early Ultrasound         EDD:   09/06/24                                      (01/22/24) ---------------------------------------------------------------------- Targeted Anatomy  Central Nervous System  Calvarium/Cranial V.:  Appears normal         Cereb./Vermis:          Not well  visualized  Cavum:                 Appears normal         Cisterna Magna:         Not well visualized  Lateral Ventricles:    Appears normal         Midline Falx:           Appears normal  Choroid Plexus:        Not well visualized  Spine  Cervical:              Not well visualized    Sacral:                 Not well visualized  Thoracic:              Not well visualized    Shape/Curvature:        Not well visualized  Lumbar:                Not well visualized  Head/Neck  Lips:  Not well visualized    Profile:                Appears normal  Neck:                  Not well visualized    Orbits/Eyes:            Lenses nwv  Nuchal Fold:           Not applicable         Mandible:               Not well visualized  Nasal Bone:            Present                Maxilla:                Not well visualized  Thorax  4 Chamber View:        Appears normal         Interventr. Septum:     Appears normal  Cardiac Rhythm:        Normal                 Cardiac Axis:           Appears normal  Cardiac Situs:         Appears normal         Diaphragm:              Appears normal  Rt Outflow Tract:      Not well visualized    3 Vessel View:          Appears normal  Lt Outflow Tract:      Appears normal         3 V Trachea View:       Not well visualized  Aortic Arch:           Not well visualized    IVC:                    Appears normal  Ductal Arch:           Previously seen        Crossing:               Appears normal  SVC:                   Appears normal  Abdomen  Ventral Wall:          Appears normal         Lt Kidney:              Not well visualized  Cord Insertion:        Appears normal         Rt Kidney:              Not well visualized  Situs:                 Appears normal         Bladder:                Previously seen  Stomach:               Appears normal  Extremities  Lt Humerus:            Previously seen        Lt Femur:  Previously seen  Rt Humerus:            Previously seen        Rt  Femur:               Previously seen  Lt Forearm:            Previously seen        Lt Lower Leg:           Previously seen  Rt Forearm:            Previously seen        Rt Lower Leg:           Previously seen  Lt Hand:               Previously seen        Lt Foot:                limited lateral view  Rt Hand:               Not well visualized    Rt Foot:                Not well visualized  Other  Umbilical Cord:        Previously seen        Genitalia:              Female prev seen  Comment:     Technically difficult due to hernia in maternal RLQ, BMI 57, and               fetal position. ---------------------------------------------------------------------- Cervix Uterus Adnexa  Cervix  Not visualized (advanced GA >24wks)  Uterus  No abnormality visualized.  Right Ovary  Not visualized.  Left Ovary  Not visualized.  Cul De Sac  No free fluid seen.  Adnexa  No abnormality visualized ---------------------------------------------------------------------- Impression  MFM Consultation  Brooke Sandoval is a 38 yo G9P5 at 16 w 3 d with an EDD of  09/06/24.  She is here for follow up growth exam.  She has multiple issue complicating her pregnancy  Most notably  CHTN  BMI 50  CHF managed with Lasix   H/o DVT on Lovenox .  She denies s/sx of PTL or Preeclampsia however, she  complains of worsening SOB and coughing at night when she  lays down. She says she cannot sleep at night either.  She has a f/u maternal echo today and scheduled to see  cardiology on Friday.           05/27/2024    1:17 PM       05/15/2024    9:30 PM  PM       05/15/2024    5:49 PM  Vitals with BMI  Systolic 136      124      145  Diastolic92       69       82  Pulse             89       95  Imaging:  SIUP with measurements consistent with dates  Good fetal movement and amniotic fluid again observed  Suboptimal views of the fetal anatomy was again seen  secondary to fetal position and maternal habitus.  Impression/Counseling:  1) CHF- I discussed with Brooke Sandoval  her symptoms and  recommended she go to MAU for further evaluation after her  echo (they will likely want to get  one anyway).  May need additional Lasix . Will see after echocardiogram.  Consider consulting cards if admitted.  BNP on 11/20 was 20.  2) CHTN-BP stable on Labetalol   3) h/o PE on Lovenox .  -Needs AntiXa assessment again.  Repeat growth scheduled in 3-4 weeks.  I spent 30 minutes with > 50% in face to face consultation  Nathanel DOROTHA Fetters, MD ----------------------------------------------------------------------              Nathanel Fetters, MD Electronically Signed Final Report   05/27/2024 02:43 pm ----------------------------------------------------------------------    Future Appointments  Date Time Provider Department Center  06/24/2024  9:35 AM [facility] NST [facility]  06/24/2024 10:15 AM Zina Jerilynn LABOR, MD [facility]  06/27/2024  2:15 PM [facility] PROVIDER 1 [facility]  06/27/2024  2:30 PM [facility] US2 [facility]  06/27/2024  3:15 PM [facility] NST [facility]  07/01/2024 10:45 AM [facility] NST [facility]  07/01/2024  2:00 PM Devra Lands, RN [facility] None  07/09/2024  9:45 AM [facility] NST [facility]  07/09/2024 11:15 AM Fredirick Glenys RAMAN, MD [facility]  07/10/2024 10:20 AM Jaycee Greig PARAS, NP PCE-PCE [address]  07/24/2024 11:15 AM Cleatus Moccasin, MD [facility]  08/06/2024 11:15 AM [facility]-GENERAL 1 [facility]  08/13/2024 11:15 AM W[facility]-GENERAL 1 [facility]  08/20/2024 11:15 AM [facility]-GENERAL 1 [facility]  08/27/2024 11:15 AM [facility]-GENERAL 1 [facility]  09/03/2024 11:15 AM W[facility]-GENERAL 1 [facility]    Discharge Condition: Stable  Discharge disposition: 01-Home or Self Care       Discharge Instructions     Activity as tolerated - No restrictions   Complete by: As directed    Ambulatory referral to Sleep Studies   Complete by: As directed    Call MD for:   Complete by: As directed    Decreased fetal movement, contractions, and vaginal bleeding.   Call MD for:  difficulty breathing,  headache or visual disturbances   Complete by: As directed    Call MD for:  persistant nausea and vomiting   Complete by: As directed    Call MD for:  redness, tenderness, or signs of infection (pain, swelling, redness, odor or green/yellow discharge around incision site)   Complete by: As directed    Call MD for:  severe uncontrolled pain   Complete by: As directed    Call MD for:  temperature >100.4   Complete by: As directed    Diet general   Complete by: As directed    For home use only DME oxygen   Complete by: As directed    Length of Need: 6 Months   Mode or (Route): Nasal cannula   Frequency: Continuous (stationary and portable oxygen unit needed)   Oxygen delivery system:  Gas Portable concentrator (POC)     May shower / Bathe   Complete by: As directed       Allergies as of 06/18/2024       Reactions   Fish Allergy    Mustard         Medication List     STOP taking these medications    budesonide -formoterol  160-4.5 MCG/ACT inhaler Commonly known as: Symbicort  Replaced by: Dulera  200-5 MCG/ACT Aero   docusate sodium  100 MG capsule Commonly known as: Colace       TAKE these medications    acetaminophen  325 MG tablet Commonly known as: TYLENOL  Take 2 tablets (650 mg total) by mouth every 4 (four) hours as needed (for pain scale <  4  OR  temperature  >/=  100.5 F).   albuterol  108 (90 Base) MCG/ACT inhaler Commonly known as: VENTOLIN  HFA Inhale 1-2 puffs into the lungs every 6 (six) hours as needed for wheezing or shortness of breath.   aspirin  81 MG chewable tablet Commonly known as: Aspirin  Childrens Chew 2 tablets (162 mg total) by mouth daily.   bisacodyl  5 MG EC tablet Commonly known as: DULCOLAX Take 1 tablet (5 mg total) by mouth daily as needed for moderate constipation.   Blood Glucose Monitoring Suppl Devi 1 each by Does not apply route as directed. Dispense based on patient and insurance preference. Use up to four times daily as  directed. (FOR ICD-10 E10.9, E11.9).   BLOOD GLUCOSE TEST STRIPS Strp 1 each by Does not apply route as directed. Dispense based on patient and insurance preference. Use up to four times daily as directed. (FOR ICD-10 E10.9, E11.9).   Blood Pressure Monitoring Devi 1 each by Does not apply route once a week.   cyclobenzaprine  10 MG tablet Commonly known as: FLEXERIL  Take 1 tablet (10 mg total) by mouth 3 (three) times daily as needed for muscle spasms.   Dulera  200-5 MCG/ACT Aero Generic drug: mometasone -formoterol  Inhale 1 puff into the lungs daily. Start taking on: June 19, 2024 Replaces: budesonide -formoterol  160-4.5 MCG/ACT inhaler   enoxaparin  100 MG/ML injection Commonly known as: LOVENOX  Inject 1 mL (100 mg total) into the skin every 12 (twelve) hours.   FreeStyle Libre 3 Plus Sensor Misc Change sensor every 15 days.   furosemide  40 MG tablet Commonly known as: Lasix  Take 1 tablet (40 mg total) by mouth 3 (three) times daily. What changed: when to take this   gabapentin  100 MG capsule Commonly known as: NEURONTIN  Take 2 capsules (200 mg total) by mouth 2 (two) times daily.   guaiFENesin  600 MG 12 hr tablet Commonly known as: MUCINEX  Take 1 tablet (600 mg total) by mouth 2 (two) times daily.   hydrOXYzine  25 MG tablet Commonly known as: ATARAX  Take 1 tablet (25 mg total) by mouth 3 (three) times daily as needed for anxiety.   ipratropium-albuterol  0.5-2.5 (3) MG/3ML Soln Commonly known as: DUONEB Take 3 mLs by nebulization every 6 (six) hours as needed.   Iron  (Ferrous Sulfate ) 325 (65 Fe) MG Tabs Take 1 tablet by mouth every other day.   labetalol  200 MG tablet Commonly known as: NORMODYNE  Take 1 tablet (200 mg total) by mouth 2 (two) times daily.   Lancet Device Misc 1 each by Does not apply route as directed. Dispense based on patient and insurance preference. Use up to four times daily as directed. (FOR ICD-10 E10.9, E11.9).   Lancets Misc 1 each  by Does not apply route as directed. Dispense based on patient and insurance preference. Use up to four times daily as directed. (FOR ICD-10 E10.9, E11.9).   multivitamin-prenatal 27-0.8 MG Tabs tablet Take 1 tablet by mouth daily at 12 noon.   ondansetron  4 MG disintegrating tablet Commonly known as: ZOFRAN -ODT Take 1 tablet (4 mg total) by mouth every 8 (eight) hours as needed for nausea vomitting   oxyCODONE  5 MG immediate release tablet Commonly known as: Oxy IR/ROXICODONE  Take 1-2 tablets (5-10 mg total) by mouth every 4 (four) hours as needed for severe pain (pain score 7-10). What changed:  how much to take reasons to take this   predniSONE  20 MG tablet Commonly known as: DELTASONE  Take 2 tablets (40 mg total) by mouth daily with  breakfast for 2 days. Start taking on: June 19, 2024   terbinafine  1 % cream Commonly known as: LAMISIL  Apply 1 Application topically 2 (two) times daily.               Durable Medical Equipment  (From admission, onward)           Start     Ordered   06/17/24 1443  For home use only DME oxygen  Once       Question Answer Comment  Length of Need 6 Months   Mode or (Route) Nasal cannula   Liters per Minute 2   Frequency Continuous (stationary and portable oxygen unit needed)   Oxygen delivery system: Gas   Oxygen delivery system: Portable concentrator (POC)      06/17/24 1443   06/17/24 1219  For home use only DME Shower stool  Once        06/17/24 1219   06/17/24 0000  For home use only DME oxygen       Question Answer Comment  Length of Need 6 Months   Mode or (Route) Nasal cannula   Frequency Continuous (stationary and portable oxygen unit needed)   Oxygen delivery system: Gas   Oxygen delivery system: Portable concentrator (POC)      06/17/24 1407            Follow-up Information     [facility]. Call.   Why: Call the office and schedule an appointment for a primary care physician. Contact  information: [address] [place]  [address] [phone]        Center for [facility] Follow up.   Specialty: Obstetrics and Gynecology Contact information: [address] [place]  [address] [phone]        Bethel Lauraine Fellows, MD Follow up.   Specialty: Obstetrics and Gynecology Why: See her or any of her partners Contact information: [address] [place] [address] [phone]                 Total discharge time: 1 hour   Signed: Vina Solian M.D. 06/18/2024, 2:32 PM

## 2024-06-18 NOTE — Progress Notes (Signed)
 " PROGRESS NOTE    Brooke Sandoval  FMW:[id number] DOB: [birth year] DOA: 06/14/2024 PCP: Patient, No Pcp Per   Brief Narrative:     Brooke Sandoval is a 38 y.o. female with past medical history of G9P0715 asthma, diastolic CHF, protein S deficiency, DVT/pulmonary embolism, anxiety, and morbid obesity  presents with shortness of breath, upper abdominal pain, and right arm cramping after having a fall yesterday.   She experiences persistent wheezing and coughing. The patient reports difficulty breathing, especially when lying down or trying to sleep. She has been taking Lasix  as prescribed and denies missing any doses recently. She describes a sensation of 'drowning' when drinking.   Labs obtained yesterday noted WBC 11.7, hemoglobin 10.8, proBNP less than 50, and high-sensitivity troponin 15.  Chest x-ray noted bronchial vascular crowding versus mild pulmonary edema.  Currently on therapeutic Lovenox .  She is currently using 4 L/min of supplemental oxygen, initiated on scheduled nebulizers as well as prednisone  therapy.  Tested positive for rhinovirus/enterovirus.  Hospitalist consulted in regards to acute respiratory failure.  Patient has improved gradually with IV Lasix , prednisone , bronchodilator therapy.  She is arranged for home oxygen.   Assessment & Plan:   Principal Problem:   Abdominal pain Active Problems:   Hernia of abdominal cavity   Decreased fetal movements in third trimester   Rhinovirus infection   Intrauterine pregnancy - Per OB/GYN   Acute respiratory failure with hypoxia Diastolic congestive heart failure exacerbation and/or asthma exacerbation Rhinovirus positive  -Multifactorial with chronic diastolic heart failure, obesity, pregnancy status send positive for rhinovirus/enterovirus.  Also having large ventral hernia with some pain likely causing some breathing issues. -CT angiogram chest negative for PE.  Heterogeneous lung parenchyma, likely small airway  disease. -Required up to 4 L/min supplemental oxygen.   - Resume IV Lasix  with good urine output.  Patient normally takes Lasix  40 mg twice daily at home.  Recommend 40 mg 3 times daily at home with laboratory work with PCP within 1 week to ensure his stable electrolytes and kidney function. - Receiving p.o. prednisone  40 mg daily.  Will recommend 2 more days of oral prednisone  - Continue DuoNeb nebulizer, Breo Ellipta . - Patient likely has OSA and needs outpatient sleep study.   Had a walking oximetry 12/23. satting 93 to 95% on room air while walking.  At rest she is anywhere between 87 to 92%. -Will set up home oxygen 2 L/min continuous. -Patient is stable from cardiopulmonary standpoint for discharge.  History of DVT/pulmonary embolism Protein S deficiency - CT angiogram chest is negative for PE - Continue  Lovenox   Leukocytosis likely from steroids  Chronic abdominal pain: CT scan with no complication from hernia.  Pain could be from fall/constipation.     Ventral hernia - CT abdomen pelvis shows ventral hernia, no complications.  Generalized body edema worse than before -Continue Lasix  as above.    DVT prophylaxis: lovenox  Code Status:  full Family Communication: none Disposition Plan: Possible discharge to home today.  Per primary  Subjective:  Patient seen and examined at the bedside.  Bilateral lower extremity edema is improving.  Her main issue is ongoing pain in the right upper quadrant/right flank area.  She reports stabbing sensation.  She also reports constipation.  She is currently in bed and not wearing any oxygen.  Vital signs are stable.  She states she needed oxygen during the night.  objective: Vitals:   06/18/24 0500 06/18/24 0810 06/18/24 0845 06/18/24 1159  BP: 123/68 130/77  123/64  Pulse: 77 81  76  Resp: 20 20  20   Temp: (!) 97.4 F (36.3 C) 98.3 F (36.8 C)  98.2 F (36.8 C)  TempSrc: Oral Oral  Oral  SpO2: 95% 91% 100% 94%  Weight:       Height:        Intake/Output Summary (Last 24 hours) at 06/18/2024 1210 Last data filed at 06/18/2024 1159 Gross per 24 hour  Intake 570 ml  Output 4500 ml  Net -3930 ml   Filed Weights   06/14/24 1807  Weight: (!) 185.5 kg    Examination:  General: Alert, oriented not in any acute distress Chest: Mildly diminished bilaterally, no wheezing or crackles heard CVS: S1, S2, no murmur, regular rhythm Abdomen: Distended, nontender to palpation, large ventral hernia Extremities: Bilateral pitting edema  Data Reviewed: I have personally reviewed following labs and imaging studies  CBC: Recent Labs  Lab 06/13/24 0815 06/14/24 2030 06/16/24 0459 06/17/24 0451  WBC 9.5 11.7* 10.0 12.0*  NEUTROABS  --   --   --  9.1*  HGB 10.3* 10.8* 9.8* 9.5*  HCT 33.9* 33.6* 30.6* 30.2*  MCV 88 84.2 85.0 89.1  PLT 220 227 217 202   Basic Metabolic Panel: Recent Labs  Lab 06/13/24 1052 06/14/24 2030 06/16/24 0459 06/17/24 0451 06/18/24 0446  NA 137 135 134* 135 136  K 4.3 4.8 4.4 3.9 3.7  CL 99 102 99 98 98  CO2 23 25 26 28 29   GLUCOSE 165* 100* 131* 124* 93  BUN 6 8 14 16 17   CREATININE 0.64 0.57 0.70 0.77 0.63  CALCIUM  8.6* 9.1 8.9 8.9 9.2   GFR: Estimated Creatinine Clearance: 166 mL/min (by C-G formula based on SCr of 0.63 mg/dL). Liver Function Tests: Recent Labs  Lab 06/13/24 1052 06/14/24 2030 06/16/24 0459  AST 11 17 18   ALT 10 9 12   ALKPHOS 81 82 75  BILITOT <0.2 0.2 <0.2  PROT 5.6* 6.2* 6.2*  ALBUMIN 3.0* 2.9* 3.1*   Recent Labs  Lab 06/15/24 0718  LIPASE 18  AMYLASE 37   No results for input(s): AMMONIA in the last 168 hours. Coagulation Profile: No results for input(s): INR, PROTIME in the last 168 hours. Cardiac Enzymes: No results for input(s): CKTOTAL, CKMB, CKMBINDEX, TROPONINI in the last 168 hours. BNP (last 3 results) Recent Labs    06/14/24 2030  PROBNP <50.0   HbA1C: No results for input(s): HGBA1C in the last 72  hours. CBG: Recent Labs  Lab 06/17/24 0900 06/17/24 1402 06/17/24 1747 06/18/24 0510 06/18/24 0911  GLUCAP 99 151* 195* 93 118*   Lipid Profile: No results for input(s): CHOL, HDL, LDLCALC, TRIG, CHOLHDL, LDLDIRECT in the last 72 hours. Thyroid Function Tests: No results for input(s): TSH, T4TOTAL, FREET4, T3FREE, THYROIDAB in the last 72 hours. Anemia Panel: No results for input(s): VITAMINB12, FOLATE, FERRITIN, TIBC, IRON , RETICCTPCT in the last 72 hours. Sepsis Labs: No results for input(s): PROCALCITON, LATICACIDVEN in the last 168 hours.  Recent Results (from the past 240 hours)  SARS Coronavirus 2 by RT PCR (hospital order, performed in [facility] hospital lab) *cepheid single result test* Anterior Nasal Swab     Status: None   Collection Time: 06/15/24 12:19 PM   Specimen: Anterior Nasal Swab  Result Value Ref Range Status   SARS Coronavirus 2 by RT PCR NEGATIVE NEGATIVE Final    Comment: Performed at [facility], [address] [place], [place] [address]  Respiratory (~20 pathogens) panel  by PCR     Status: Abnormal   Collection Time: 06/15/24 12:19 PM   Specimen: Nasopharyngeal Swab; Respiratory  Result Value Ref Range Status   Adenovirus NOT DETECTED NOT DETECTED Final   Coronavirus 229E NOT DETECTED NOT DETECTED Final    Comment: (NOTE) The Coronavirus on the Respiratory Panel, DOES NOT test for the novel  Coronavirus (2019 nCoV)    Coronavirus HKU1 NOT DETECTED NOT DETECTED Final   Coronavirus NL63 NOT DETECTED NOT DETECTED Final   Coronavirus OC43 NOT DETECTED NOT DETECTED Final   Metapneumovirus NOT DETECTED NOT DETECTED Final   Rhinovirus / Enterovirus DETECTED (A) NOT DETECTED Final   Influenza A NOT DETECTED NOT DETECTED Final   Influenza B NOT DETECTED NOT DETECTED Final   Parainfluenza Virus 1 NOT DETECTED NOT DETECTED Final   Parainfluenza Virus 2 NOT DETECTED NOT DETECTED Final   Parainfluenza Virus 3  NOT DETECTED NOT DETECTED Final   Parainfluenza Virus 4 NOT DETECTED NOT DETECTED Final   Respiratory Syncytial Virus NOT DETECTED NOT DETECTED Final   Bordetella pertussis NOT DETECTED NOT DETECTED Final   Bordetella Parapertussis NOT DETECTED NOT DETECTED Final   Chlamydophila pneumoniae NOT DETECTED NOT DETECTED Final   Mycoplasma pneumoniae NOT DETECTED NOT DETECTED Final    Comment: Performed at [facility], [address] [place], [place] [address]         Radiology Studies: No results found.       Scheduled Meds:  aspirin   162 mg Oral Daily   enoxaparin   100 mg Subcutaneous Q12H   ferrous sulfate   325 mg Oral QODAY   fluticasone  furoate-vilanterol  1 puff Inhalation Daily   furosemide   40 mg Intravenous Q12H   guaiFENesin   600 mg Oral BID   insulin  aspart       insulin  aspart  0-14 Units Subcutaneous TID PC   ipratropium-albuterol   3 mL Nebulization QID   labetalol   200 mg Oral BID   oxyCODONE -acetaminophen   1 tablet Oral Once   predniSONE   40 mg Oral Q breakfast   prenatal multivitamin  1 tablet Oral Q1200   Continuous Infusions:        Rilen Shukla, MD [facility] 06/18/2024, 12:10 PM   "

## 2024-06-18 NOTE — Inpatient Diabetes Management (Signed)
 Inpatient Diabetes Program Recommendations  Diabetes Treatment Program Recommendations  ADA Standards of Care Diabetes in Pregnancy Target Glucose Ranges:  Fasting: 70 - 95 mg/dL 1 hr postprandial: Less than 140mg /dL (from first bite of meal) 2 hr postprandial: Less than 120 mg/dL (from first bite of meal)    Lab Results  Component Value Date   GLUCAP 93 06/18/2024   HGBA1C 5.6 02/21/2024    Review of Glycemic Control  Latest Reference Range & Units 06/16/24 16:51 06/16/24 20:09 06/17/24 06:52 06/17/24 09:00 06/17/24 14:02 06/17/24 17:47  Glucose-Capillary 70 - 99 mg/dL 859 (H) 848 (H) 871 (H) 99 151 (H) 195 (H)  (H): Data is abnormally high Diabetes history: No; prior GDM hx; [redacted]W[redacted]D Outpatient Diabetes medications: None Current orders for Inpatient glycemic control: Novolog  0-14 units TID 2 hour after meals; Prednisone  40 mg QAM   Inpatient Diabetes Program Recommendations:     Insulin : If steroids are continued, consider ordering Novolog  3 units TID with meals for meal coverage if patient eats at least 50% of meals.   Thanks, Tinnie Minus, MSN, RNC-OB Diabetes Coordinator [phone] (8a-5p)\

## 2024-06-20 ENCOUNTER — Other Ambulatory Visit (HOSPITAL_BASED_OUTPATIENT_CLINIC_OR_DEPARTMENT_OTHER): Payer: Self-pay

## 2024-06-20 ENCOUNTER — Other Ambulatory Visit (HOSPITAL_COMMUNITY): Payer: Self-pay

## 2024-06-21 NOTE — Consults (Signed)
 ------------------------------------------------------------------------------- Attestation signed by Brooke Merilee Riedel, MD at 06/22/2024  9:44 AM I saw and evaluated the patient and reviewed the resident's note. I agree with the resident's findings and plan.   History as noted below. I have ordered/reviewed imaging from both this admission and previous.  Patient is more in the obese and now presents with bowel obstruction secondary. In addition, the care of this patient has been discussed with adhesions within a known hernia at her previous cesarean incision.  She is tender to palpation the right lower quadrant but she suspects this is secondary to recent fall.  She did have vomiting prior to admission but has not vomited during the day today at the time of my evaluation.  She states she has she had has had partial obstructions in the past from what she suspects to be this same pathology.  Operative intervention would be exceedingly difficulty especially in the setting of a gravid uterus.  Given her morbid obesity we would need to make a supraumbilical midline incision and even then we would have difficulty accessing the hernia site.  I would like to treat this like an adhesive small bowel obstruction given the fact I believe that adhesions within the hernia are actually causing her bowel obstruction and not an acutely incarcerated hernia per se.  I discussed this case with the OB attending and she is in agreement that operative intervention at this time would be incredibly difficult and is in full agreement that conservative, nonoperative management at this time is ideal.  We will attempt to obtain a small bowel follow-through.  NG tube access has been difficulty and therefore we will allow her to drink the contrast that she is not currently having severe nausea or emesis.  Obviously, operative intervention is not off the table.  But we will need to closely monitor her and I am very hopeful that we can get  her through this without acute operative intervention.    Electronically Signed by: Brooke Brooke Riedel, MD, Attending Physician 06/22/2024 9:40 AM   -------------------------------------------------------------------------------  Emergency General Surgery Consult Note  Chief Complaint/Reason for Consult: incarcerated hernia  HPI:   Brooke Sandoval is a 38 y.o. female with PMH significant for CHF,  ADHD, anxiety, Protein S deficiency with DVT (on lovenox ), who presents on Sat 06/21/2024 with ventral hernia. She is [redacted] weeks pregnant, and notes that she has been having abdominal pain and constipation x6 days. She endorses nausea and denies emesis. She denies fever/chills. Her last bowel movement was 6 days ago. She states that she has nt had a good bowel movement in 1 month, however had a small BM 6 days ago.   Has hx ventral hernia and SBO. Reports that her last c section involved someone cutting the bowel and requiring an NGT for throwing up bowel.  Most recent labs notable for WBC 17.70*, Hgb 12.1*, plts 288, Cr 0.58*. CT Abd/Pelvis demonstrated Large right spigelian hernia. Within the lateral aspect of the hernia there is a dilated loop of small bowel with fecalized contents and possible transition point, raising concern obstruction. Small volume ascites within the hernia raises concern for incarceration and/or early congestive ischemia.    On evaluation, patient is afebrile and hemodynamically stable, saturating  well on room air. She last ate/drank at yesterday.  Current anticoagulation/antiplatelet/steroid medication: therapeutic lovenox  (taking for DVT). Last dose: yesterday  Prior abd surgeries:  - C section x5 - ventral hernia repair   History of Present Illness  Medical History[1]  Surgical History[2]  Family History[3]    Allergies[4] Prior to Admission medications  Medication Sig Start Date End Date Taking? Authorizing Provider  oxyCODONE  (ROXICODONE ) 5 mg immediate  release tablet Take 10 mg by mouth every 6 (six) hours as needed for moderate pain (4-6). Takes 5 or 10mg  q6h for pain    HISTORICAL PROVIDER, CONVERSION     Review of Systems A complete ROS was performed with pertinent positives/negatives noted in the HPI. The remainder of the ROS are negative.  Objective: Vital Signs: Temp:  [97.5 F (36.4 C)] 97.5 F (36.4 C) Heart Rate:  [95-100] 95 Resp:  [16-20] 16 BP: (123-187)/(62-113) 133/73        I&O No intake/output data recorded. No intake/output data recorded. No intake or output data in the 24 hours ending 06/21/24 1959  Physical Exam General: female, sitting up and visibly uncomfortable  HEENT: normocephalic, atraumatic, moist mucous membranes  Cardiovascular: Hemodynamically stable  Chest/Lungs: Non-labored breathing, oxygenating well on room air  Abdomen: Soft, large secondary to body habitus, RLQ bruise from reported fall with surrounding edema, tender to RLQ  Extremities: No gross deformities  Skin: Warm and dry  GU: No foley  Neuro: no gross deficits     LABS: Recent Results (from the past 24 hours)  Comprehensive Metabolic Panel   Collection Time: 06/21/24  3:45 AM  Result Value Ref Range   Sodium 135 (L) 136 - 145 mmol/L   Potassium 4.9 3.5 - 5.1 mmol/L   Chloride 98 98 - 107 mmol/L   CO2 27 21 - 31 mmol/L   Anion Gap 10 6 - 14 mmol/L   Glucose, Random 111 (H) 70 - 99 mg/dL   Blood Urea Nitrogen (BUN) 11 7 - 25 mg/dL   Creatinine 9.41 (L) 9.39 - 1.20 mg/dL   eGFR >09 >40 fO/fpw/8.26f7   Albumin 3.4 (L) 3.5 - 5.7 g/dL   Total Protein 6.4 6.4 - 8.9 g/dL   Bilirubin, Total 0.4 0.3 - 1.0 mg/dL   Alkaline Phosphatase (ALP) 67 34 - 104 U/L   Aspartate Aminotransferase (AST) 33 13 - 39 U/L   Alanine Aminotransferase (ALT) 14 7 - 52 U/L   Calcium  8.7 8.6 - 10.3 mg/dL   BUN/Creatinine Ratio 19.0 10.0 - 20.0   Corrected Calcium  9.2 mg/dL  Lipase   Collection Time: 06/21/24  3:45 AM  Result Value Ref Range    Lipase 15 11 - 82 U/L  CBC without Differential   Collection Time: 06/21/24  3:45 AM  Result Value Ref Range   WBC 17.70 (H) 4.40 - 11.00 10*3/uL   RBC 4.59 4.10 - 5.10 10*6/uL   Hemoglobin 12.1 (L) 12.3 - 15.3 g/dL   Hematocrit 62.6 64.0 - 44.6 %   Mean Corpuscular Volume (MCV) 81.3 80.0 - 96.0 fL   Mean Corpuscular Hemoglobin (MCH) 26.4 (L) 27.5 - 33.2 pg   Mean Corpuscular Hemoglobin Conc (MCHC) 32.5 (L) 33.0 - 37.0 g/dL   Red Cell Distribution Width (RDW) 18.8 (H) 12.3 - 17.0 %   Platelet Count (PLT) 288 150 - 450 10*3/uL   Mean Platelet Volume (MPV) 7.4 6.8 - 10.2 fL  Type and screen   Collection Time: 06/21/24  3:45 AM  Result Value Ref Range   Component Type RED CELLS    Crossmatch Expiration 06-24-2024,2359    Type & Rh (ABORH) A Positive    Antibody Screen NEG   B-Type Natriuretic Peptide (BNP)   Collection Time: 06/21/24  3:45 AM  Result Value  Ref Range   B-Type Natriuretic Peptide (BNP) <50 <100 pg/mL  Urinalysis with Reflex to Microscopic   Collection Time: 06/21/24  4:09 AM  Result Value Ref Range   Color, Urine Yellow Yellow   Clarity, Urine Clear Clear   Specific Gravity, Urine 1.037 (H) 1.005 - 1.025   pH, Urine 6.0 5.0 - 8.0   Protein, Urine 30 (A) Negative mg/dL   Glucose, Urine Negative Negative mg/dL   Ketones, Urine Trace (A) Negative mg/dL   Bilirubin, Urine 1+ (A) Negative   Blood, Urine Negative Negative   Nitrite, Urine Negative Negative   Leukocyte Esterase, Urine Negative Negative   Urobilinogen, Urine 4.0 (A) <2.0 mg/dL   WBC, Urine 0-5 <6 /HPF   RBC, Urine 3-5 (A) 0 - 2 /HPF   Bacteria, Urine Rare None Seen, Rare /HPF   Squamous Epithelial Cells, Urine 6-10 (A) 0 - 5 /HPF  Protein, Total, Random Urine   Collection Time: 06/21/24  4:09 AM  Result Value Ref Range   Protein, Total, Urine 36 (H) <10 mg/dL   Creatinine, Urine 668 >=20 mg/dL   Protein/Creatinine Ratio, Urine 109 <=200 mg/g creat  Patient ABO/Rh Recheck   Collection Time:  06/21/24  4:09 AM  Result Value Ref Range   Type & Rh (ABORH) A Positive   Lactic Acid   Collection Time: 06/21/24  4:32 AM  Result Value Ref Range   Lactic Acid 0.9 0.5 - 2.2 mmol/L  POC Glucose   Collection Time: 06/21/24  6:46 AM  Result Value Ref Range   Glucose, POC 123 (H) 70 - 99 mg/dL   Results   Review of Ancillary Data: Imaging:  CT ABDOMEN PELVIS W CONTRAST, 06/21/2024 5:04 AM   INDICATION: Acute abdominal pain, large umbilical hernia, rule out incarceration of hernia  COMPARISON: None.   TECHNIQUE: CT images of the abdomen and pelvis were obtained after intravenous administration of iodinated contrast. Conventional axial reconstructions and multiplanar reformatted images were submitted for review.    FINDINGS:   Evaluation is mildly degraded secondary to photon starvation artifact.   . Lower Chest: Minimal bibasilar atelectasis.   . Liver: No suspicious focal findings. . Gallbladder/Biliary: Nondilated gallbladder. No intra- or extra hepatic biliary ductal dilatation. SABRA Spleen: Unremarkable. . Pancreas: Unremarkable. . Adrenals: Unremarkable. . Kidneys: Symmetric enhancement. No hydronephrosis. No nephrolithiasis.   . Peritoneum/Mesenteries/Extraperitoneum: No free air. No free fluid or loculated drainable collection. No pathologically enlarged lymph nodes. . Gastrointestinal tract: Large right Spigelian hernia measuring up to 8 to 9 cm at the neck containing nondilated loops of small bowel. There is focal dilation of small bowel loops along the lateral aspect of the hernia measuring up to 4.3 cm in maximum diameter, difficult to visualize given loops are partially contained outside of the dual-energy field. Questionable transition point along the distal aspect raises concern for obstruction (Series 3, image 136). Additional questionable transition point proximally raises concern for possible closed loop obstruction (series 3, image 126). Small volume ascites within  the hernia. Normal caliber appendix.   SABRA Ureters: Unremarkable. . Bladder: Unremarkable. . Reproductive System: Gravid uterus.   . Vascular: Unremarkable. . Musculoskeletal: No acute displaced fractures. Multilevel mild to moderate thoracolumbar spondylosis. Multilevel mild to moderate lumbar facet arthropathy. No aggressive focal bony lesions. Diffuse abdominal wall anasarca.   IMPRESSION:   1.  Large right spigelian hernia. Within the lateral aspect of the hernia there is a dilated loop of small bowel with fecalized contents and possible transition point, raising  concern obstruction. Small volume ascites within the hernia raises concern for incarceration and/or early congestive ischemia.  2.  Gravid uterus.  Assessment: Brooke Sandoval is a 38 y.o. female [redacted]w[redacted]d pregnant with PMHx CHF,  ADHD, anxiety, Protein S deficiency with DVT (on lovenox ) who presents with incarcerated abdominal hernia  Assessment & Plan   Recommendations: - recommend NGT for decompression - will discuss surgical plans with OB team given patients severe comorbidities and prior history of premature delivery   - Please page EGS with any questions/concerns  Patient discussed with EGS attending, Dr. Tess, who directed the plan of care.    Electronically signed by: Schuyler Starlette Parma, MD 06/21/2024 7:59 PM         [1] No past medical history on file. [2] No past surgical history on file. [3] No family history on file. [4] Not on File

## 2024-06-21 NOTE — Nursing Note (Signed)
"  °  °  23:33: Rapid Response [place] RN at bedside attempting to place NG Tube. 3 other RN's at bedside holding Upper extremities and head in attempt to minimize pt movement. Unable to place at this time. Per [place] RN #12 fr NG tube unable to advance in L nostril any further. Attempted R nostril however pt moving and screaming during attempt and stated I can not do it anymore, please tell them to do something else, my nose has already been broken at least twice, tell them I refuse this. No further  attempts made MD made aware of above.  "

## 2024-06-21 NOTE — H&P (Signed)
 ------------------------------------------------------------------------------- Attestation signed by Lionel Almarie Arlean Eilleen, MD at 06/22/2024  4:17 AM OB Attending I reviewed the patient's status and agree with the plan of care as documented by the resident.  Briefly this is a 38yo [id number] [redacted]w[redacted]d with known umbilical hernia presenting with abdominal pain.  Patient is medically complex with multiple severe comorbidities - many diagnoses initially not endorsed by the patient, but discovered upon chart review - including hx DVT/protein S deficiency - on lovenox  100mg  BID, Hx CHF for which patient is on lasix  (most recent EF 55-60% this pregnancy), GDMA1 diagnosed last week, CHTN not on meds. Additionally her BMI is 60 (wt 407lb) and she has a obstetrical history significant for 5 prior C sections, all preterm - last C section complicated by extensive adhesive disease and possible bowel injury with repair. She also has a hx of IUFD x2 which were delivered vaginally. CT scan notable for large spigelian hernia with concern for obstruction +/- incarceration. Reviewed imaging with EGS attending - and suspect findings may be consistent with obstruction 2/2 adhesive disease in the setting of multiple prior c sections. Patient is an incredibly poor surgical candidate and would prefer to avoid surgery if possible - EGS reccs include placing NGT overnight with plan for small bowel follow through to better assess obstruction. Patient without emesis overnight or during the day which is also reassuring. Pain improving with medications. Holding off on BMZ for fetal lung maturity at this time as she is not going for surgery but can have MFM team address threshold to offer with patient tomorrow. Due to increased risk of thrombosis (DVT/PE) particularly in setting of pregnancy with hx of DVT - will continue lovenox  tonight. If EGS recommending surgery can further discuss [facility] management. Plan to admit under antepartum service  with MFM team to follow.   Lionel Eilleen, MD Obstetrics & Gynecology [facility]  -------------------------------------------------------------------------------  History & Physical   Service: Obstetrics  Chief Complaint: abdominal pain   Clinic: [facility]  Diagnoses: Intrauterine pregnancy Known umbilical hernia  Gestational diabetes  Rhinovirus in pregnancy requiring oxygen support  Obesity  Hx of CS x5  Grand multip AMA CHTN CHF Protein S deficiency  Hx of VTE Hx of IUFD  Gestational Age: 102w0d Dating Criteria: patient reported    Assessment & Plan    Brooke Sandoval is a 38 y.o. [phone] being treated for:    #Abdominal pain  #C/f Obstruction, c/f hernia incarceration  - VSS  - Arrived via EMS from home. Was admitted to [facility]/18/[facility] for hypoxia/rhinovirus. Documented known 21 cm hernia - Hx of CS x 5, endorses damage to bowel in last cesarean section  - Endorses pain at her hernia site since [redacted] weeks gestation that has been intermittent. S/p CT abdomen/pelvis 06/15/2024 demonstrating distended stomach, large R lower abdominal ventral abdominal wall hernia containing non-obstructed non-inflamed small bowel. No small bowel wall thickening, mesenteric inflammation or obstruction. Moderate volume of stool throughout colon. No colonic inflammation.  - Pain in umbilical area, RUQ, and LUQ became constant and stabbing on 06/20/24 and has been persistent. Endorses 4 episodes of emesis that was dark black. Denies seeing blood in emesis.  - States she has not had a bowel movement in over a month - Recounts a similar situation two years ago when she had acute abdominal pain and constipation  - Denies chest pain/pressure, UTI symptoms  - Deferred fetal monitoring in the setting of body habitus and severe pain. Fetal  HR by BSUS 135 bpm. Visualized fetal movement and tone.  - Exam: acutely tender to palpation in umbilical area, RUQ, and RLQ. No TTP  elsewhere on abdomen. Did not tolerate sitting up for assessment of CVA tenderness.  - SVE 0/0/-4 - VSS, initially SRBP on admit, subsequently normal  - WBC 17k, otherwise unremarkable  - CMP unremarkable  - Lipase normal, lactic acid normal  - CT Abdomen/Pelvis: Large right spigelian hernia. Within the lateral aspect of the hernia there is a dilated loop of small bowel with fecalized contents and possible transition point, raising concern obstruction. Small volume ascites within the hernia raises concern for incarceration and/or early congestive ischemia  - Intervention: 2 mg IV morphine  q3h, IV zofran  > relief  Assessment: Small bowel obstruction with concern for incarceration vs ischemia  Plan:  - EGS consulted, recommended NGT placement followed by small bowel follow through. Unfortunately, patient unable to tolerate NGT placement despite multiple attempts, Ativan /Valium, and Chloraseptic spray. After discussion with EGS, plan to continue with small bowel follow through without NGT in place - Consents signed on admission  - Counseled on possible need for vertical midline incision if cesarean delivery, discussed that this would be decided on the day of surgery based on exam   Medically complicated as below; did see Steffan Keys, MD ([facility]) with consult note in ultrasound report on 02/06/24  #BMI 60 # Hx of DVT, PE # Protein S deficiency: - PE 2008, subsequently diagnosed with Protein S deficiency and recommended tLov indefinitely - On therapeutic lovenox  100mg  BID with factor XA level 12/3 - 0.34 Plan - Repeat Xa level ordered on admit - Single dose of Lovenox  100mg  given 12/28 @ 0111 - Consider continuing Lovenox  100mg  BID vs transitioning to Heparin gtt pending need for possible surgical intervention  #Hx of CHF - Diagnosed with heart failure after prior pregnancies - Risk for a primary cardiac event in pregnancy is 5% per MFM note. - Saw cards 03/08/24, recommended lasix  40mg  every  other day > patient reports taking 40mg  TID - EF TTE 05/28/24 55-60% Plan - Lasix  40 mg ordered as prescribed every other day - May want to curbside cards here given TTE was after most recent cards appt  #Glucose intolerance, Hx of GDM - Failed GTT (appears to be 2 hour) - 119 fasting, 170 for 1 hour, 89 for 2 hour; defer BMZ. Ordered for FSBS q4h while NPO, then will transition to fasting and 2hrPP once tolerating PO. mIVF LR @ 125cc/hr while NPO #Hx of preterm delivery - all prior deliveries between 24-36w in the setting of preE/HELLP, 2 prior demises around 22-24 weeks #Hx of IUFDx2 - 22-24 weeks in the setting of severe pre-E #AMA - LR NIPT, growth limited by obesity (last 19w) #Hx of CS x 5 - no overt evidence of previa on US  although severely limited in the setting of morbid obesity #cHTN, Hx of PreE, HELLP - isolated SRBP on admit, normal PreE labs #Hx of SUD - Per [facility] records, takes oxycodone  chronically at home for chronic pain, unclear who is prescriber #Rhinovirus - briefly on O2 in triage, but satting appropriately so weaned  #Fetal Wellbeing  -Fetal Monitoring: FHR 135 bpm by BSUS, monitoring deferred in the setting of body habitus and severe pain. Repeat FHR in 130s in PM 12/27.  -Steroids: Deferred -NICU Consult: Ordered, RN charge made aware -Group B Streptococcus: Not collected -Presentation: Cephalic by bedside ultrasound   #Maternal Considerations -Blood Type: A Positive -DVT Prophylaxis:  SQ Enoxaparin  (one dose of 100mg  ordered 12/28 @ 0111. Consider continued 100mg  BID vs transitioning to Heparin gtt pending need for possible surgical intervention)   #Postpartum Planning -Infant feeding: Not addressed  -Contraception: Bilateral tubal sterilization and Post placental IUD (consents signed with delivery)   Disposition: Admit to Antepartum  Subjective    Brooke Sandoval presents with acute onset abdominal pain on 06/20/2025. She has a known large umbilical hernia  and hx of CS x 5. She states that she has noted pain at her hernia site that has been intermittent since [redacted] weeks gestation. It became constant and 10/10 in severity yesterday. Endorses not having a bowel movement for > 1 month.    OB Review of Systems: Fetal movement: Active Vaginal bleeding: No Loss of fluid: No Contractions: No   Preeclampsia Symptoms Review: Headaches: No Vision changes: No Chest pain and/or shortness of breath: Yes -- SOB in setting of rhinovirus  RUQ pain: Yes  Review of Systems: A complete review of systems was performed with pertinent positives as above.   Objective   Temp:  [97.5 F (36.4 C)] 97.5 F (36.4 C) Heart Rate:  [95-100] 95 Resp:  [16-20] 16 BP: (123-187)/(62-113) 133/73   Physical Exam: GENERAL: Alert, no acute distress CHEST: No increased work of breathing CV: Regular rate ABDOMEN: Gravid, soft, non-tender though exam limited by habitus EXTREMITIES:  Warm and well-perfused, non-tender NEURO: CN II-XII grossly intact SVE: 0/ 0/ -4 SSE: deferred  Obstetric ultrasounds reviewed in Epic.  Lab results and other imaging reviewed in Epic.  History   OB History  Gravida Para Term Preterm AB Living  9 7  7 1 5   SAB IAB Ectopic Molar Multiple Live Births  1     5    # Outcome Date GA Lbr Len/2nd Weight Sex Type Anes PTL Lv  9 Current           8 Preterm 04/06/15 [redacted]w[redacted]d   M CS-LTranv   LIV  7 SAB 2014 [redacted]w[redacted]d         6 Preterm 04/14/11 [redacted]w[redacted]d    CS-LTranv   LIV  5 Preterm 02/2010 [redacted]w[redacted]d   M NSVD   FD  4 Preterm 09/23/07 [redacted]w[redacted]d   F CS-LTranv   LIV  3 Preterm 07/26/06 [redacted]w[redacted]d   M CS-LTranv   LIV  2 Preterm 2007 [redacted]w[redacted]d    NSVD   FD     Complications: Disorder of hemostasis (CMD)  1 Preterm 09/15/03 [redacted]w[redacted]d   M CS-LTranv   LIV    Medical History[1]  Surgical History[2]  Coding for today's visit was reached by Medical Decision Making (MDM).  Amadeo Free, MD Obstetrics & Gynecology, PGY-2  Bridgette Kennyth [place], MD  Obstetrics and  Gynecology, PGY2 [facility] [place]   Tawni Friends, [place] PGY-4 Obstetrics & Gynecology  Sharyne Dunks, MD PGY-2 Obstetrics & Gynecology 06/22/2024 1:19 AM        [1] No past medical history on file. [2] No past surgical history on file.

## 2024-06-22 NOTE — Nursing Note (Signed)
 MD Debby to the bedside due to not being able to keep patient on fetal monitor while on O2. Patient refusing at this time due to pain and wants a break. MD aware and ok for patient to take a break. RN able to get intermittent heart rate on baby.

## 2024-06-22 NOTE — Nursing Note (Signed)
 Patient off the floor at 1550 for KUB . She was sent on 3L of O2

## 2024-06-22 NOTE — Procedures (Signed)
------------------------------------------------------------------------------- °  Attestation signed by Harlene Rosina Poisson, MD at 06/22/2024  2:36 PM I reviewed the maternal-fetal tracing and agree with the documentation below, edited as appropriate.  Tylan A. Poisson, MD Maternal Fetal Medicine / Obstetrics and Gynecology [facility]  -------------------------------------------------------------------------------  Non Stress Test Procedure Note  Patient: Brooke Sandoval Gestational Age: [redacted]w[redacted]d Date: 06/22/2024  Indication: Abdominal pain  FHT: Baseline HR 140, moderate variability, positive accelerations, small variable decelerations  Tocometer: Quiet  Impression: Reassuring for gestational age  Brooke DASEN. Debby, MD  HO-IV, Obstetrics and Gynecology [facility] 06/22/2024 11:20 AM

## 2024-06-22 NOTE — Progress Notes (Addendum)
 Rapid Response notified @ 2234 for another attempt at insertion of NG tube.   Provider is at bedside to explain to patient the importance of an NG tube and patient agreed to another attempt.  Chloraseptic spray to throat area as ordered.  Patient was premedicated with Ativan  0.5 mg PO and Valium 5 mg IV prior to NG insertion attempt.  #12 Fr. NG attempted in  right and left nare, but unable to pass NG thru nasal cavity.  Patient became uncooperative and refusing any further attempts at NG insertion.  Patient stating in a loud voice that her nose has been broken twice.  MD provider notified and updated on status of NG refusal.  Vital signs evaluated and charted below.  FHT 138. O2 sat 94-96% on Room Air.      06/21/24 2300  Vitals  Temp 98.9 F (37.2 C)  Temp Source Oral  Heart Rate 82  Resp 17  BP 134/63  MAP (mmHg) 91  Oxygen Therapy  SpO2 93 %  O2 Device None (Room air)  Pulse Oximetry Type Intermittent  Pain Assessment  Pain Assessment 0-10  Pain Score   8  Pain Type Acute pain  Pain Location Abdomen  Pain Descriptors Discomfort  Pain Onset Ongoing  Patient's Stated Pain Goal No pain  Pain Intervention(s) Medication (See MAR)  Multiple Pain Sites No  Sedation Scales  Sedation Scale Used Pasero Opioid-induced Sedation Scale  [place]ero Opioid-Induced Sedation Scale (POSS) 1

## 2024-06-22 NOTE — Care Plan (Addendum)
 EGS Plan of Care  Contacted by primary team that patient started passing gas and has contrast in the colon on her XR at 4 hours. Recommended holding off on a bowel regimen until she passes more gas/stool from the contrast as it is diagnostic and therapeutic. Okay to start CLD and advance as tolerated.  Above was discussed with Dr. Tess, EGS attending on call.    Electronically signed by:  Powell Karna Areas, MD 06/22/2024 6:02 PM

## 2024-06-23 ENCOUNTER — Telehealth: Payer: Self-pay

## 2024-06-23 NOTE — Progress Notes (Signed)
 Case Management Adult Assessment  CSN: [id number] DOB: 1986/03/29 Service: Obstetrics Location: 1010/01   Info & Contacts Assessment Completed: In-person interview with Patient The patient's status at this time is:: Able to communicate Prior to admission, patient resided at: Private residence Prior to admission, patient lived with : Children The patient's decision maker is:: Patient At discharge, will patient return to prior residence: Yes Barriers to education: No barriers   No emergency contact information on file.  Assessment Is this patient at baseline?: Yes Was patient independent with ADLs prior to admission?: Yes Was patient independent with mobility prior to admission?: Yes Does this patient have or need any DME?: No Does this patient have or need any home health services?: No Does this patient have or need any personal care services?: No   Social Does patient have a mental health diagnosis?: No Does patient have an acute substance use issue?: No   Discharge How will patient obtain prescription meds at discharge:: Medicaid Type of Payer Source:: Medicaid How will this patient obtain follow-up care after discharge?: Outpatient clinic How will this patient reach the discharge destination?: Family/Friends Is this a Chronic Dialysis patient?: No Patient Discharge Goals of Care: Return to Work/Prior Level of Function  Discharge Education Discharge Plan Discussed With:: Patient Education Readiness:: Acceptance Education Method:: Explanation Education Response:: Verbalizes Understanding   SW met with patient at her bedside an introduced self and role. Patient reports that she is having a BB and states that she is undecided on the name. Patient reports that herself and her 5 older children (20yo son, 17yo son, 16yo daughter, 13yo daughter & 9yo son) are in the home. Patient reports that FOB is involved and support, but reports that he is not in the home. Patient reports  limited family support and states that her oldest son is very supportive. SW asked patent if she wanted to list anyone on her emergency contact list, and patient declined and asked to keep it blank. Patient states that she is not currently working and has been out of work since [redacted] weeks gestation. 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 Patient intends to breast feed and wants to get a breast pump through her insurance. SW provided patient with a list of free items she could receive through her medicaid. Patient has WIC and states that she has SNAP but she is waiting for them to fix it. SW provided patient with a list of food banks within [place] as well as places that help with utility bills. SW made patient aware of her medicaid transportation and provided patient with instruction on how to set up a ride. Patient states that she has barriers with transportation and hopes that she can stay at [facility] since she will start being [facility] at [facility]. SW explained to patient that MD have to find it medically necessary for her to stay at the [facility]. Patient verbalized that she understood. Patient does not have a car seat yet and states that she has a crib with no mattress. SW encouraged patient to order a free car seat through her insurance. Patient reports a history of anxiety. Patient reports a stable mood at this time and declined mental health resources. SW made patient aware of the microwaves on the unit, pastoral care and RM family room. SW made a high risk pregnancy case management referral on 12/29 to supervisor Eleanor Misty [phone]) for  additional resources and support.      Case Management Coordination Status: Coordination Complete    Anticipated Discharge Location: Home  If Plan A discharging location is not feasible: Potential Plan B: Home  Jayla Smith, MSW

## 2024-06-23 NOTE — Procedures (Signed)
------------------------------------------------------------------------------- °  Attestation with edits by Harlene Rosina Poisson, MD at 06/25/2024  8:41 AM I reviewed the maternal-fetal tracing and agree with the documentation below, edited as appropriate.  Marelyn A. Poisson, MD Maternal Fetal Medicine / Obstetrics and Gynecology [facility]  -------------------------------------------------------------------------------  Non Stress Test Documentation: Date: 06/23/2024  Facility Name: [facility] Indication: fetal wellbeing, complex maternal comorbidities  Brooke Sandoval is a 38 y.o. [id number] at [redacted]w[redacted]d   FHT: Baseline HR 135, moderate variability, 10x10 + accelerations, no decelerations   Tocometer: quiet  Impression: Reactive for gestational age    Time: >79min  Laurance Parisian, MD PGY3 Obstetrics & Gynecology

## 2024-06-23 NOTE — Progress Notes (Signed)
 ------------------------------------------------------------------------------- Attestation with edits by Harlene Rosina Poisson, MD at 06/23/2024  9:36 PM With the fellow, I saw and evaluated the patient, reviewed the medical record, provided counseling, and discussed management. I agree with the documentation below, edited as appropriate.  Tyara A. Poisson, MD Maternal Fetal Medicine / Obstetrics and Gynecology [facility]  -------------------------------------------------------------------------------  MFM Fellow Attestation:  Brooke Sandoval is a 38 y.o. [phone] at [redacted]w[redacted]d admitted for suspected SBO (improving) and SOB w/ PM O2 requirement on lasix . Patient reports feeling better today and having bowel movements. SOB is improved and patient has not required O2 today. Cardiology and EGS consulted and following. Patient also with history of PE/DVT w/ Protein S deficiency, on therapeutic lovenox . Anti-Xa levels previously subtherapeutic, repeat this AM with improved levels. Will continue current dose of lovenox  at this time and consider re-calculation based on weight and history after OSH records obtained/review. We discussed concerns regarding patient's chronic medical conditions and implications in pregnancy as well as importance of safe follow-up plan following hospital discharge in the next few days. Will engage SW to assist with this. Today, will continue to monitor toleration of regular diet and bowel regimen. Will continue to f/u pain/discomfort and continue dispo planning. Sleep medicine also consulted for sleep evaluation, suspected OSA.  I saw and evaluated the patient with the resident physician, Dr. Faith. I have reviewed the note for completion and accuracy and made edits as appropriate. I agree with the assessment and plan as below.   Dannial Pipes, MD  Maternal Fetal Medicine Fellow Department of Obstetrics and Gynecology [facility]  HIGH RISK  OBSTETRICS  DAILY PROGRESS NOTE  ASSESSMENT & PLAN    Brooke Sandoval is a 38 y.o. [phone] at [redacted]w[redacted]d with the following problems being managed:  #Worsening abdominal pain w/ emesis #Large ventral hernia, c/f obstruction #Obstipation (resolved) - Known hernia (defect 9cm, sac 20cm), multiple prior OSH admissions for pain, requiring intermittent opiates since 20w - Presented following multiple emesis episodes, worsening abdominal pain, and 1.54mo w/ no BM >> in triage, improved N/V, TTP periumbilical and b/l upper quadrants, SVE CLH, labs unremarkable, failed multiple attempts at NGT placement - CTAP (12/27): large R spigelian hernia containing dilated small bowel loop possible transition point, small volume ascites in hernia, c/f obstruction w/ possible early congestive ischemia - SBFT (12/28): contrast in colon - EGS following Assessment: Small bowel obstruction initially with concern for incarceration vs ischemia, now s/p SBFT and bowel rest w several BM overnight and improvement in N/V. Plan:  - Continue to monitor pain, N/V, and BM  - 2 mg IV morphine  q3h prn breakthrough, Roxi 5mg  q6h prn pain - IV zofran /compazine prn N/V  - Advanced diet as tolerated - Appreciate EGS recs - Will discuss safe dispo planing with SW, patient may benefit from [employer] given medical complexity and reported travel challenges to [place].   Medically complicated as below, followed by [facility] this pregnancy.   #cHTN #H/o preterm PreE-SF/HELLP - No home BP meds - Admit PreE labs neg, isolated SR BP on arrival in s/o pain, subsequently NT  #Thrombophilia, H/o DVT/PE - Remote DVT/PE (2008), subsequently dx Protein S deficiency, on weight-adjusted LVX this pregnancy per [facility] (previously on ppx) - Reports compliance w/ LVX 100mg  BID, Xa subtherapeutic 0.34 (12/3) >> 0.1 (admit) - Home LVX 100mg  BID resumed (initially held pending possible OR) Plan - Rpt Xa level, adjust dose prn -  Follow up OSH records  to assess appropriate dosing   #H/o CHF pEF, Lasix  dependent #Suspected OSA #Asthma #Rhinovirus+ - Reports h/o CHF w/ prior pregnancies, unclear circumstances (records pending); baseline orthopnea (has never had sleep study) - s/p Cardiology consult (02/2024) rec for Lasix  40mg  q2d (pt taking TID), has not ever followed regularly w/ Cardiology - TTE (05/28/24): EF 55-60%, nl function/anatomy - Rhinovirus+ during recent [facility] admission (12/20-24) req up to 5L O2 [place] when sleeping, BNP neg, CXR w/ edema, CTPE neg, dc'd home on O2 - This admission, req 4-5L [place] while sleeping lying flat for NST (on 12/28); satting well on RA while awake, BNP neg, CXR w/ edema - Cardiology following: goal net 1L neg, Lasix  40mg  IV BID Plan - Appreciate Cardiology recs, will plan to increase to Lasix  80mg  IV BID tomorrow if not net neg today  - Sleep Medicine consulted for consideration of CPAP for presumed OSA - Anesthesia consult placed   - Resume home asthma inhaler and nebs - O2 supplementation for sats >94% prn   #Glucose intolerance #H/o GDM prior pregnancy -A1c 5.6 (01/2024) >> Failed 2h GTT -FS ordered, deferring BMZ for now   #H/o PTB  #H/o CS x5 -All prior deliveries 22-36w in s/o PreE/HELLP or 2nd tri IUFD w/ placental insufficiency -Pt reports challenging last CS w/ bowel injury -[facility] US  (12/2): EFW 865g (60%), anterior placenta, no comment on PAS concerns Plan -Will attempt to obtain all prior OSH records -Consider APLS testing if not previously performed -For formal MFM US  when sonographer available this week w/ TVUS for placental eval   #AMA - LR NIPT #Obesity (BMI 60) - VTE ppx as above   #Fetal Wellbeing  -Fetal Monitoring: daily NST -Steroids: Deferred -NICU Consult: Ordered -Group B Streptococcus: Not collected -Presentation: Cephalic by bedside ultrasound   #Maternal Considerations -Blood Type: A Positive -DVT Prophylaxis: as above   #Postpartum  Planning -Infant feeding: Not addressed  -Contraception: Bilateral tubal sterilization AND post placental IUD (for AUB); consents signed with delivery   #Postpartum Planning -Infant feeding: Not addressed  -Contraception: Bilateral tubal sterilization and placental IUD (consents signed with delivery)  Please contact the primary team via Secure chat: [facility] MFM Team (High Risk Obstetrics Consults Maternal Fetal Medicine)  SUBJECTIVE    No urgent concerns this morning. Feels abdominal pain is mildly improved now that she has had multiple BM since completion of SBFT. Continues to deny N/V.  Still notes significant TTP @ hernia but states she no longer needs 'around the clock' pain medications. She notes throat pain which she attributes to the attempted NGT placement- states Chloraseptic spray mildly relieves symptoms. Tolerating PO advancement.  Overall she is very nervous regarding ultimate discharge planning and her outpatient care given she was dismissed from her provider at [facility] and lives ~ 1 hour from [facility].   OB Review of Systems: Fetal movement: No Vaginal bleeding: No Loss of fluid: No Contractions: No   Preeclampsia Symptoms Review: Headaches: No Vision changes: No Chest pain and/or shortness of breath: No  RUQ pain: No  OBJECTIVE   Temp:  [97.8 F (36.6 C)-98.6 F (37 C)] 98.1 F (36.7 C) Heart Rate:  [78-97] 82 Resp:  [18-20] 18 BP: (109-135)/(51-71) 112/71   Intake/Output Summary (Last 24 hours) at 06/23/2024 1145 Last data filed at 06/23/2024 1030 Gross per 24 hour  Intake 1685 ml  Output 2380 ml  Net -695 ml     GENERAL: NAD, well appearing CHEST: No increased work of breathing on RA, conversant  in full sentences ABDOMEN: Soft, obese, gravid, no rebound/guarding EXTREMITIES: Nontender NEURO: AAO x 3, grossly intact  Fetal monitoring: see separate NST procedure note   LABS: Review Flowsheet       Latest Ref Rng & Units 06/23/2024 06/22/2024  06/21/2024  Common Labs  Hemoglobin 12.3 - 15.3 g/dL - 89.4*  87.8*   Hematocrit 35.9 - 44.6 % - 32.5*  37.3   Platelet Count (Plt) 150 - 450 10*3/uL - 248  288   WBC 4.40 - 11.00 10*3/uL - 13.70*  17.70*   Sodium 136 - 145 mmol/L 136  135*  135*   Potassium 3.5 - 5.1 mmol/L 5.0  4.5  4.9   Glucose 70 - 99 mg/dL 94  99  888*   Magnesium  1.9 - 2.7 mg/dL 1.9  1.9  -  Blood Urea Nitrogen 7 - 25 mg/dL 9  12  11    Creatinine 0.60 - 1.20 mg/dL 9.33  9.33  9.41*   Calcium  Level Total 8.6 - 10.3 mg/dL 8.5*  8.1*  8.7   Albumin 3.5 - 5.7 g/dL 3.1*  3.0*  3.4*   Aspartate Aminotransferase (AST) 13 - 39 U/L 7*  8*  33   Alanine Aminotransferase (ALT) 7 - 52 U/L 9  10  14    Bilirubin Total 0.3 - 1.0 mg/dL 0.4  0.3  0.4   Total Protein 6.4 - 8.9 g/dL 5.8*  5.5*  6.4   Protein, Urine <10 mg/dL - - 36*     IMAGING: CT Abdomen Pelvis W Contrast Result Date: 06/21/2024 1.  Large right spigelian hernia. Within the lateral aspect of the hernia there is a dilated loop of small bowel with fecalized contents and possible transition point, raising concern obstruction. Small volume ascites within the hernia raises concern for incarceration and/or early congestive ischemia. 2.  Gravid uterus.  Today's coding was reached by Medical Decision Making (MDM).   Laurance Parisian, MD PGY3 Obstetrics & Gynecology   *Some images could not be shown.

## 2024-06-23 NOTE — Procedures (Signed)
 Non Stress Test Procedure Note  Patient: Brooke Sandoval Gestational Age: [redacted]w[redacted]d Date: 06/23/2024  Indication: ventral hernia, CHF, asthma, URI, Obesity, h/o IUFD, cHTN, AMA   FHT: Baseline HR 135, moderate variability, positive accelerations, negative decelerations   Tocometer: Quiet  Impression: Reassuring for gestational age

## 2024-06-23 NOTE — Telephone Encounter (Addendum)
 Spoke with nurse at [facility] regarding referral to transfer this patient's care due to hernia.  Per nurse navigota, records were received and reviewed 06/23/24.  Patient is currently admitted to [facility] for bowel concerns, they intend to reach out to her for scheduling once she is discharged.   [place], RN 06/23/24   ----- Message from Vina Solian, MD sent at 06/18/2024  1:14 PM EST ----- Regarding: Transition of care to [facility] This patient desperately needs to have her care transitioned to [facility] for her ongoing care due to her hernia. Please call their number: [phone]. They have a nurse navigator you can request to talk to - Melissa Maltzahn. I spoke to Dr. Mathew on 12/24 about her so they are aware.   Thanks, pad

## 2024-06-24 ENCOUNTER — Encounter: Admitting: Obstetrics and Gynecology

## 2024-06-24 ENCOUNTER — Other Ambulatory Visit

## 2024-06-24 ENCOUNTER — Telehealth: Payer: Self-pay | Admitting: Family Medicine

## 2024-06-24 NOTE — Care Plan (Signed)
 Gen Cards Plan of Care  Briefly, this is a 38 years old female currently 29w pregnant with history of prior complicated pregnancy w/ pre-term birth and severe pre-eclampsia, ?chronic HFpEF, Protein S deficiency c/b DVT/PE on Lovenox , Asthma, Anxiety and morbid obesity (BMI 60) who presented for abd pain.   Cardiology consulted for concern for volume overload. Recent TTE with pEF.   RECOMMENDATIONS - Increase Lasix  80mg  IV BID, goal net negative 1L.  - Strict I &Os - Daily standing weight - Daily BMP and Mag

## 2024-06-24 NOTE — Telephone Encounter (Signed)
 Called [facility] [place] at [phone]. Spoke to [place], and she said she was going to send the information to [employer] the armed forces training and education officer. Fax number, and call back number was given.

## 2024-06-25 ENCOUNTER — Other Ambulatory Visit (HOSPITAL_BASED_OUTPATIENT_CLINIC_OR_DEPARTMENT_OTHER): Payer: Self-pay

## 2024-06-25 NOTE — Procedures (Signed)
------------------------------------------------------------------------------- °  Attestation with edits by Harlene Rosina Poisson, MD at 06/25/2024  3:29 PM I reviewed the maternal-fetal tracing and agree with the documentation below, edited as appropriate.  Ahria A. Poisson, MD Maternal Fetal Medicine / Obstetrics and Gynecology [facility]  -------------------------------------------------------------------------------  Non Stress Test Documentation: Date: 06/25/2024  Facility Name: [facility] Indication: fetal wellbeing, complex maternal comorbidities  Brooke Sandoval is a 38 y.o. Brooke Sandoval at [redacted]w[redacted]d   FHT: Baseline HR 135, moderate variability, 10x10 + accelerations, no decelerations   Tocometer: quiet  Impression: Reactive for gestational age    Time: >38min  Brooke Parisian, MD PGY3 Obstetrics & Gynecology

## 2024-06-26 NOTE — Procedures (Signed)
------------------------------------------------------------------------------- °  Attestation with edits by Harlene Rosina Poisson, MD at 06/26/2024  4:31 PM I reviewed the maternal-fetal tracing and agree with the documentation below, edited as appropriate.  Floreen A. Poisson, MD Maternal Fetal Medicine / Obstetrics and Gynecology [facility]  -------------------------------------------------------------------------------  Non Stress Test Documentation: Date: 06/26/2024  Facility Name: [facility] Indication: fetal wellbeing, complex maternal comorbidities  Brooke Sandoval is a 39 y.o. [id number] at [redacted]w[redacted]d   FHT: Baseline HR 125, moderate variability, 10x10 + accelerations, no decelerations   Tocometer: quiet  Impression: Reactive for gestational age    Time: >53min  Laurance Parisian, MD PGY3 Obstetrics & Gynecology

## 2024-06-27 ENCOUNTER — Ambulatory Visit

## 2024-06-27 NOTE — Nursing Note (Signed)
 AVS provided and discharge education performed with pt at 1245. Pt verbalized understanding of discharge education at this time, no concerns voiced. All patient belongings taken to discharge lounge with patient. Patient left unit in stable condition via wheelchair

## 2024-06-27 NOTE — Progress Notes (Signed)
 Case Management Discharge Note        CSN: [id number] DOB: 07-Oct-1985 Service: Obstetrics Location: 1010/01  Patient Class: Inpatient  DC Disposition: : Other Health Institution (Will stay at [facility] until delivery)  Discharge DC Disposition: : Other Health Institution (Will stay at [facility] until delivery) Referrals:  [facility]) Discharge Transport: Medicaid Transport Discharge Transport Agency Chosen: Other (comment) ([facility])  Discharge Referrals Services Provided: Transportation, Social Worker Case closed, patient/family agree with disposition plan: Yes  SW set up medicaid transportation [phone]) for patient today 06/27/24 to go home before going to [facility]. Patient will find a ride back by tomorrow.          Jayla Smith, MSW

## 2024-06-29 NOTE — Progress Notes (Signed)
See DC Summary.

## 2024-06-29 NOTE — Discharge Summary (Signed)
 ------------------------------------------------------------------------------- Attestation with edits by Harlene Rosina Poisson, MD at 06/29/2024 11:22 AM With the resident, I saw and evaluated the patient, reviewed the medical record, provided counseling, and discussed management. I agree with the documentation below, edited as appropriate.  Danyelle A. Poisson, MD Maternal Fetal Medicine / Obstetrics and Gynecology [facility]  -------------------------------------------------------------------------------  Discharge Summary   Name: Brooke Sandoval Age: 39 y.o. MRN: [id number] DOB: May 12, 1986  Referring Clinic/Provider: [facility] > [facility]  Admit date: 06/21/2024 Admitting Physician: Lionel Almarie Arlean Eilleen, MD Admission Condition: Fair Admission Diagnoses:  See admission history and physical  Discharge date: 06/27/2024  Discharge Physician: Harlene Rosina Poisson, MD  Discharged Condition: Good  Discharge Diagnoses and Overview:   Patient Active Problem List   Diagnosis Date Noted  Date Diagnosed   *Hernia of abdominal cavity, with obstruction 06/21/2024     Overview Note:    - Known large Spigellian hernia (defect 9cm, sac 20cm) in s/o multiple prior CS w/ extensive adhesions (see Prob List) - Multiple prior OSH admissions for pain requiring intermittent opiates since 20w - Admitted to antepartum (12/27-1/2) for SBO r/o incarceration, failed NGT, passed SBFT, improved N/V/pain, successful BM after 1.65mo obstipation >> resolved s/p normal SBFT, EGS signed off, having BMs and tolerating PO, pain improved on q6 Roxi - CTAP (12/27): large R spigelian hernia containing dilated small bowel loop possible transition point, small volume ascites in hernia, c/f obstruction w/ possible early congestive ischemia - 29w ToC per [facility] recommendation for delivery at tertiary care center  - Will need interdiscplinary surgical planning meeting - Continue Roxi q6 prn  (needs refills, insurance covers only 5d supply at a time)    CHFpEF (uncertain diagnosis) 06/23/2024     Overview Note:    -Very unclear h/o longstanding possible CHFpEF, carried forward in many OSH notes in s/o clinical sx of dyspnea, orthopnea, LE edema. However, all TTE records demonstrating no cardiac dysfunction and BNPs all neg; only 2012 CT Chest noting cardiomegaly and pulm vasc congestion. Has required at least daily Lasix  since 2016. -CXR (12/28): Mild pulmonary interstitial edema.  -TTE (12/3): LV EF 55-60%, nl LV and RV function, nl LA and RA size -s/p Cardiology consult during ante admission: concur w/ unclear dx, suspect multiple other risk factors for HF-like symptoms, titrated Lasix  to 80mg  BID PO, BNP stably neg at discharge  -For outpatient Cardiology follow up    Venous insufficiency of left leg 06/29/2024     Overview Note:    Dependent erythema of L plantar surface and toes in s/o chronic lasix -dependent LE edema w/ baseline positional pain/tingling (see Media img, improves w/ elevation, worsens upon standing) -LE Duplex neg (1/1)  -Continue intermediate LVX ppx (see Thrombophilia Prob List) and lasix  (see CHF Prob List) -Compression socks when ambulating and elevation when resting    Severe obesity with alveolar hypoventilation affecting pregnancy in third trimester    (CMD) 06/24/2024     Overview Note:    Pregravid BMI 58 (Ht 5'9, Wt 392lb) (TWG 11-20lb recommended) Baseline serum PEC labs neg, UPC neg, A1c 5.6 (01/2024)   -TWG 10lb -Healthy diet/exercise encouraged -Fetal surveillance per IUFD Prob List -Delivery timing per CS Prob List    Moderate asthma (CMD) 06/24/2024     Overview Note:    -No h/o intubation, many prior hospitalizations for exacerbation (usually in s/o URI), most recently as follows:  -[facility] (11/20) for abd pain and SOB >> improved w/ nebs/steroid course (11/20-25), resp  panel neg, bnp neg, neg KUB/CXR -[facility] ante[facility] (12/2-4) for  SOB >> TTE w/ nl EF, BNP neg, LE duplex neg, CXR w/ pulm edema vs bronchovascular crowding, CTPE neg, CT Abd w/ stable hernia, +adenovirus -[facility] ante[facility] (12/20-24) for SOB and worsening abd pain after fall >> RUQ US  wnl, BNP neg, rpt CXR/CTPE/CTAP unchanged, +rhinovirus, dc'd on steroid taper (12/21-12/26) and 2L home O2 -Does not follow w/ Pulm; previously has been on several maintenance inhalers (Advair, Symbicort , Breo), most recently rxd Dulera   -[facility] antepartum (12/27-1/2) 1d of transient O2 req while sleeping, stable CXR, dc'd on no O2 and increased Lasix   Current Regimen: Dulera  220-5 BID 2puffs + albuterol  prn  -Symptom monitoring qvisit (+PFTs if worsening symptoms or baseline persistent asthma) -Anesthesia consult requested inpatient (not seen) >> needs outpt referral -Pulmonology referral needed -Fetal surveillance and delivery timing per IUFD/CS Prob List -Avoid hemabate at delivery    Multigravida of advanced maternal age in third trimester (CMD) 06/24/2024     Overview Note:    39yo at EDD Panorama low fetal frx >> Prequel LR female (in Media)  -Daily bASA for PEC ppx -Fetal surveillance and delivery timing per IUFD/CS Prob List    Chronic hypertension 06/24/2024     Overview Note:    -See extensive h/o PEC Prob List -Previously on Labetalol  200mg  BID per [facility] >> held majority of ante admission due to normotension off meds, attempted reinitiation (1/1) w/ symptomatic hypotension (pt states she reported this rxn prior to [facility]), dc'd and did not require reinitiation of BP med Current Regimen: none  -Continue bASA -If needing BP med initiation, consider Procardia rather than labetalol  -Fetal surveillance and delivery timing per IUFD/CS Prob List -Short interval PP follow-up for BP check -PEC precautions    Hx of preeclampsia, prior pregnancy, currently pregnant (CMD) 06/24/2024     Overview Note:    G1 (2005): [redacted]w[redacted]d HELLP, G2 (2007): 24w IUFD, G3 (2008): 28w PEC-SF,  G5 (2011): 25w IUFD (placental infarcts), G6 (2012): [redacted]w[redacted]d NRFHT (placental infarcts), G7 (2016): 35w PEC-SF/GDM -Baseline serum PEC labs neg, UPC 0.1, APLS testing neg (2011) >> PEC labs stably neg during ante admission -See also cHTN and IUFD Prob Lists  -Daily bASA -PEC precautions    History of IUFD 06/24/2024     Overview Note:    G2 (2007): 24w IUFD (unknown etiology) G5 (2011): 25w IUFD (placental infarcts 30%) >> *Of note, G6 (2012): [redacted]w[redacted]d NRFHT (placental infarcts 50%) -No known autopsy or genetic testing results, APLS testing neg (see Thrombophilia Prob List)  -Fetal surveillance: weekly BPP at 32w, growth US  q4w -Delivery timing per CS Prob List    History of cesarean delivery 06/24/2024     Overview Note:    G1 23w LTCS (op report w/ difficult delivery, failed forceps at hysterotomy) G3 28w CS unspecified G4 36w LTCS (op report w/ extensive adhesions) G6 22w mid-transverse CS (no comment on adhesions in op report) G7 35w LTCS (op report noting obliteration of tissue planes, attenuated LUS, small bowel deserosalization) -Anterior placenta on [facility] anatomy US , no comment on PAS -TVUS/TAUS (12/31): suspect L anterolateral placenta, extremely limited views  -Consider MRI for placental eval given PAS risk? 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 However, all LE Duplex and CTPE study records demonstrating NO e/o VTE. 02/2007 VQ scan - no convincing e/o PE, focal accumulation of uncertain clinical significance could be seen in s/o thrombosis of L basilic vein. Pt denies personal or family h/o VTE and states she  has never been on therapeutic AC prior to this pregnancy. Has not followed w/ Heme. -Per [facility] notes: h/o VTE 2008 and switched to therapeutic dosing (100mg  BID) 01/2024; however, subsequent 12/3 contact note reflecting that pt could be back on ppx dose given no current VTE.  -Thrombophilia workup performed 12/2009 after 2nd IUFD: APLS labs neg, PTG mutation neg, FVL neg, ProtC wnl, ProtS deficient  -Xa subtherapeutic during admission. Concerns regarding true therapeutic AC unless necessary given anticipated surgical/neuraxial complexity and significant risk for preterm/unplanned delivery this pregnancy. Discussed dosing w/ Pharmacy while inpatient. Given morbid obesity, ProtS deficiency, and limited mobility, intermediate dose LVX reasonable (0.5mg /kg BID ~100mg  BID).    OSA (obstructive sleep apnea) 06/24/2024     Overview Note:    -Highly suspected due to longstanding baseline orthopnea and nighttime desaturations when lying flat, has never had sleep study  -Sleep Med consult requested while inpatient (not seen), needs outpt referral    Suspected glucose intolerance 06/24/2024     Overview Note:    G7 pregnancy w/ GDM and PEC [facility] Records: A1c 5.6% (01/2024), 2h GTT 119/170/89 (06/13/24, recent prednisone  course and pt confirmed NOT fasting) Had been given Freestyle Libre at [facility], reviewed and unlikely GDM >> dc'd    Abdominal pain affecting pregnancy (CMD) 06/24/2024     Overview Note:    See Hernia Prob List    [redacted] weeks gestation of pregnancy (CMD) 06/22/2024     Overview Note:    29w ToC from [facility] MFM Dating by [redacted]w[redacted]d [facility] US  - Estimated Date of Delivery: 09/06/24  OB Labs (reviewed OSH records) - T&S A Positive, Last pap record 2011 NILM (however notes reporting unspecified h/o abnl pap 2010 and 2014) - rpt cotest PP   Relevant labs - HepB/HepC neg, msAFP neg, 2h GTT (119/170/89) unfasted, HIV/RPR neg (Dec), Hgb 9.1 w/ Ferritin 23 (Dec) (see separate Prob Lists) *VZV/Rubella/GCCT  ordered on antepartum >> VZV/Rub imm, GCCT neg  Aneuploidy Panorama low ff >> Prequel LR female  Carrier Horizon4 neg  Anatomy US  Very limited views, anterior placenta no comment on PAS (performed at [facility])  36w (reviewed by **) - GCCT **   GBS **, PCN allergy? none   Fetal position: **  Peripartum Planning:   Antenatal testing: weekly at 32w Anesthesia: needs consult Delivery: See prior CS and Hernia Prob Lists PP Feeding: ** BCM: desires sterilization, Medicaid form signed at [facility] (11/7 in Media) AND postplacental LNG IUD (for AUB)  Vaccines TDaP (27w+): s/p 12/19 (Media) COVID19: ** Flu: s/p 10/16 (Media) RSV (32-36w): **       Resolved Problems  No resolved problems to display.    Surgeries/Procedures performed this admission: N/A  Hospital Course:  Gala Padovano is a 39 y.o. [id number] who was admitted at [redacted]w[redacted]d for abdominal pain in the setting of known umbilical hernia.   Patient has very extensive medical history, please refer to updated problem list above for further details   Patient is medically complex with multiple severe comorbidities - many diagnoses initially not endorsed by the patient, but discovered upon chart review - including hx DVT/protein S  deficiency - on lovenox  100mg  BID, Hx CHF for which patient is on lasix  (most recent EF 55-60% this pregnancy), suspected GDMA1 (later found not to be correct), CHTN on Labetalol . Additionally her BMI is 60 (wt 407lb) and she has a obstetrical history significant for 5 prior C sections, all preterm - last C section complicated by extensive adhesive disease and bowel deserosalization with repair. She also has a hx of IUFD x2 which were delivered vaginally, suspected due to placental insufficiency and multiple prior preterm deliveries in s/o preeclampsia. CT scan on admission was notable for large spigelian hernia with concern for obstruction +/- incarceration. EGS was consulted who were concerned for obstruction 2/2 adhesive  disease in the setting of multiple prior c sections. Patient was an incredibly poor surgical candidate, so they deferred surgery in favor of NGT tube placement with SBFT. NGT failed multiple attempts. She underwent a SBFT which confirmed contrast in the colon and so her diet was advanced which she tolerated. She subsequently had several bowel movements with mild-moderate improved to her abdominal pain. Pain was managed with home Roxi 5/10 mg q6h prn pain + 2 mg IV morphine  q3h prn breakthrough (to facilitate NST, pt reported worse pain w/ NST as monitors had to be manually held to obtain tracing). EGS signed off but follow up appt for surgical planning was requested upon discharge.  Her admission was also complicated by:   SOB w/ PM O2 requirement on lasix  HD2. Cardiology was consulted for presumed HFpEF (given OSH documentation, although diagnosis unclear) who recommend aggressive diuresis with uptitration of IV lasix  before ultimately discharging her on Lasix  80mg  PO BID. BNP at time of discharge WNL. Sleep medicine was consulted but did not see patient prior to discharge. She never required O2 supplementation after than night and was ambulating w/o difficulty.  Patient with questionable history of PE/DVT w/ Protein S deficiency, intended to be on therapeutic (per [facility], Lovenox  100 mg BID initiated in 1st tri). Anti-Xa levels previously and on admission subtherapeutic. On review, patient likely does not have history of PE and rather notes a large clot in her placenta. Given risk factors, decision made to continue with intermediate dose anticoagulation (true therapeutic regimen deferred as not truly indicated and pt is a complex surgical candidate with risk for unplanned delivery); given weight, intermediate dosing remained at Lovenox  100 mg BID.   Patient with significant social concerns given she lives ~ 1 hour away and may require acute care given large hernia with risk of repeat SBO vs incarcerated  hernia. Patient was for transfer of care from [facility] given [facility] recommendation for delivery at tertiary care center. Patient discharged with plan to stay in [facility] for remainder of pregnancy.   Patient with history of 5 CS. States her OBUS have been difficult due to habitus and hernia. On review of OSH US , no clear documentation PAS concerns but placenta anterior reportedly. Formal US  12/31 w very limited visualization of placenta and lower uterine segment due to maternal acoustics and patient discomfort; overall unable to evaluate for sonographic evidence of PAS in L anterolateral placenta.  Patient may benefit from MRI for further evaluation of PAS.  Patient called out with unilateral L foot erythremia (photos in media tab). LE doppler Nml. Low concern for infection >> findings more likely c/w venous insufficiency.   Patient with history of Chronic hypertension with Labetalol  200 BID previously held given hypotension. It was unheld 1/1 in the setting of high MRBP overnight.  She received one dose and subsequently experienced symptomatic hypotension, her Labetalol  was discontinued with plan for Procardia Xl 30 mg PO daily if BP medication needed. However, pt had no recurrence of mild range BP and thus was not discharged on any BP meds.  Physical Exam at Discharge:  Vitals:   06/27/24 1103  BP:   Pulse:   Resp: 19  Temp:   SpO2:    GENERAL: NAD, well appearing ambulating in room CHEST: no increased work of breathing on RA, conversant in full sentences ABDOMEN: Soft, obese, gravid, no rebound/guarding, mildly TTP overlying hernia EXTREMITIES: Left foot w positional erythema and xerosis with desquamation noted along the plantar surface NEURO: AAO x 3, grossly intact  Consults:  Emergency general surgery  Cardiology  Lactation   Significant Diagnostic Studies:  CBC: Results from last 7 days  Lab Units 06/27/24 0724 06/25/24 0522 06/24/24 1050  WHITE BLOOD CELL  COUNT 10*3/uL 12.20* 13.00* 9.70  HEMOGLOBIN g/dL 89.8* 89.3* 9.1*  PLATELET COUNT 10*3/uL 205 234 223   BMP/CMP/CHEMISTRIES: Results from last 7 days  Lab Units 06/27/24 0613 06/26/24 0409 06/25/24 0521  SODIUM mmol/L 135* 136 137  POTASSIUM mmol/L 3.9 3.9 3.8  CHLORIDE mmol/L 96* 98 98  CO2 mmol/L 31 28 30   BUN mg/dL 13 13 12   CREATININE mg/dL 9.32 9.47* 9.38  ANION GAP mmol/L 8 10 9   CALCIUM  mg/dL 8.4* 9.0 8.5*  MAGNESIUM  mg/dL 1.6* 1.7* 1.7*  TOTAL PROTEIN g/dL 5.8* 5.9* 5.7*  ALBUMIN g/dL 3.0* 3.2* 3.1*  BILIRUBIN TOTAL mg/dL 0.2* 0.3 0.3  AST U/L 8* 7* 9*  ALT U/L 9 10 10    Urine Studies:  Results from last 7 days  Lab Units 06/27/24 0936  COLOR UA  Yellow  NITRITE UA  Negative  BLOOD, URINE  Negative  BILIRUBIN UA  Negative  UROBILINOGEN, URINE mg/dL 2.0*  GLUCOSE, UA mg/dL Negative    Imaging: 87/72 CT AP w Co  12/28 CXR, SBFT  12/31 ASOB  1/1 US  LE Dopp   Disposition: Home  Patient Instructions: see AVS  Discharge Medications:  acetaminophen  325 mg tablet Commonly known as: TYLENOL  Take 2 tablets (650 mg total) by mouth every 6 (six) hours as needed for mild pain (Take 2 tablets (650 mg total) by mouth every 6 (six) hours as needed for mild pain (1-3).)   hydrocortisone-pramoxine 2.5-1 % Crea Commonly known as: ANAlPRAM-HC Insert 1 Application into the rectum 2 (two) times a day.   naloxone 4 mg/actuation Spry nasal spray Commonly known as: Narcan For suspected opioid overdose, give a single spray into ONE nostril.  Call 911.  If no or minimal response after 3 minutes, give another spray in OTHER nostril using the 2nd device. (Administer 1 spray into affected nostril(s) as needed for opioid reversal. Spray the contents of 1 device into 1 nostril. Call 911. May repeat with 2nd device in alternate nostril if no response in 2-3 minutes.)   prochlorperazine 10 mg/2 mL (5 mg/mL) injection Commonly known as: COMPAZINE Infuse 1 mL (5 mg total) into a  venous catheter every 6 (six) hours as needed for nausea or vomiting (second line).   white petrolatum jelly Apply topically as needed for dry skin.    ferrous sulfate  325 mg (65 mg iron ) EC tablet Take 1 tablet (325 mg total) by mouth every other day. What changed: when to take this   furosemide  80 mg tablet Commonly known as: LASIX  Take 1 tablet (80 mg total) by mouth in  the morning and 1 tablet (80 mg total) in the evening. What changed:  medication strength how much to take when to take this   oxyCODONE  10 mg Tab Commonly known as: ROXICODONE  Take half to one tablet (5-10 mg total) by mouth every 6 (six) hours as needed for moderate pain. (Take 1 tablet (10 mg total) by mouth every 6 (six) hours as needed for moderate pain (4-6). Takes 5 or 10mg  q6h for pain) What changed: medication strength    albuterol  HFA 90 mcg/actuation inhaler Commonly known as: PROVENTIL  HFA;VENTOLIN  HFA;PROAIR  HFA Inhale 2 puffs into the lungs every 6 (six) hours as needed for wheezing. (Inhale 2 puffs every 6 (six) hours as needed for wheezing.)   cyclobenzaprine  10 mg tablet Commonly known as: FLEXERIL  Take 10 mg by mouth 3 (three) times a day as needed for muscle spasms.   Dulera  200-5 mcg/actuation Hfaa inhaler Generic drug: mometasone -formoterol  Inhale 2 puffs into the lungs 2 (two) times a day. (Inhale 2 puffs 2 (two) times a day.)   enoxaparin  100 mg/mL Syrg Commonly known as: LOVENOX  Inject the contents of 1 syringe (100 mg total) under the skin every 12 (twelve) hours. (Inject 1 mL (100 mg total) under the skin every 12 (twelve) hours.)   ondansetron  4 mg disintegrating tablet Commonly known as: ZOFRAN -ODT Dissolve 4 mg on tongue every 8 (eight) hours as needed for nausea or vomiting.   prenatal vitamin 60 mg iron -1 mg Tab tablet Commonly known as: VINATE Take 1 tablet by mouth daily.    Follow-up Appointments:  Messaged sent to requst transfer of care IPV at [facility].   Appointments which have been scheduled for you    Jul 08, 2024 1:05 PM Office Visit with EGS ATTENDING CLINIC [facility] [place] [address] GENERAL SURGERY [facility]) [facility] [place] [address] [phone]  Please arrive 15 minutes prior to your scheduled visit.       I have personally spent 25 minutes involved in face-to-face and non-face-to-face activities for this patient on the day of the visit.  Professional time spent includes the following activities, in addition to those noted in the documentation:Preparing to see the patient on day of service, Obtaining and/or reviewing history, Performing medically appropriate exam, Counseling/education of patient, Ordering medications, test or procedure, Documenting visit, and Coordinating care  Laurance Parisian, MD PGY3 Obstetrics & Gynecology

## 2024-07-01 ENCOUNTER — Other Ambulatory Visit

## 2024-07-01 ENCOUNTER — Telehealth

## 2024-07-01 ENCOUNTER — Other Ambulatory Visit (HOSPITAL_COMMUNITY): Payer: Self-pay

## 2024-07-03 ENCOUNTER — Telehealth: Payer: Self-pay | Admitting: *Deleted

## 2024-07-03 NOTE — Progress Notes (Signed)
 Complex Care Management Care Guide Note  07/03/2024 Name: Ethelmae Ringel MRN: [id number] DOB: [birth year]  Javonne Louissaint is a 39 y.o. year old female who is a primary care patient of Patient, No Pcp Per and is actively engaged with the care management team. I reached out to Harlene Anette Lesches by phone today to assist with re-scheduling  with the RN Case Manager.  Follow up plan: Unsuccessful telephone outreach attempt made. A HIPAA compliant phone message was left for the patient providing contact information and requesting a return call.  Thedford Franks, CMA, AAMA [place]  [facility], [facility] Guide, Lead Direct Dial: [phone]  Fax: [phone]

## 2024-07-09 ENCOUNTER — Ambulatory Visit

## 2024-07-09 ENCOUNTER — Encounter: Admitting: Family Medicine

## 2024-07-10 ENCOUNTER — Ambulatory Visit: Admitting: Family

## 2024-07-10 NOTE — Progress Notes (Signed)
 Erroneous encounter-disregard

## 2024-07-11 NOTE — Progress Notes (Signed)
 Complex Care Management Care Guide Note  07/11/2024 Name: Brooke Sandoval MRN: [id number] DOB: 1985-08-17  Brooke Sandoval is a 39 y.o. year old female who is a primary care patient of Patient, No Pcp Per and is actively engaged with the care management team. I reached out to Harlene Anette Lesches by phone today to assist with re-scheduling  with the RN Case Manager.  Follow up plan: pt still admitted at [facility] - pt will contact us  upon d/c   Thedford Franks, CMA, AAMA [place]  [facility], [facility] Guide, Lead Direct Dial: [phone]  Fax: [phone]

## 2024-07-23 ENCOUNTER — Encounter

## 2024-07-24 ENCOUNTER — Encounter: Admitting: Obstetrics and Gynecology

## 2024-07-31 NOTE — H&P (Signed)
 ------------------------------------------------------------------------------- Attestation with edits by Harlene Rosina Poisson, MD at 07/31/2024  8:43 AM With the fellow, I saw and evaluated the patient, reviewed the medical record, provided counseling, and discussed management. I agree with the documentation below, edited as appropriate.  Brooke A. Poisson, MD Maternal Fetal Medicine / Obstetrics and Gynecology [facility]   -------------------------------------------------------------------------------  History & Physical   Service: Obstetrics  Chief Complaint: Scheduled C/s, possible cesarean hysterectomy  Clinic: [facility] > transfer to care to [facility]  Diagnoses: Intrauterine pregnancy Known 21 cm ventral hernia/Large Spighellian abdominal hernia Suspected glucose intolerance OSA Obesity  Hx of CS x5  Grand multip AMA CHTN Suspected HFpEF Protein S deficiency Unconfirmed Hx of VTE Hx of IUFD Asthma Hx PreE  Gestational Age: [redacted]w[redacted]d Dating Criteria: LMP consistent with [redacted]w[redacted]d US    Assessment & Plan    Brooke Sandoval is a 39 y.o. [phone] being treated for:    #H/o CS x 5  #Planned Repeat C-section, high risk for PAS - History of Csx5 w/ left anterior lateral placenta that is poorly visualized, unable to obtain MRI, cannot rule out PAS - Plan: Location of surgery: [facility] L&D OR - Contraception: bilateral salpingectomy (and LNG IUD for AUB) vs hysterectomy  - Discussed c-section and surgical risks including risk of bleeding, infection, damage to surrounding organs including bowel, bladder, and ureters, risk of injury to the infant, <1% risk of c-hyst. Patient voices understanding and all questions were answered to her satisfaction. Informed consents have been signed. PLAN Surgical team: Thurs Feb 5 ([redacted]w[redacted]d) EGS for entry/closure (team aware) MFM Attending: Poisson (L&D), Meridee (to assist) MFM Fellow: Mathew Pitch back-up: Court Stents:  Meridee Concerns: Bleeding risks: Complex surg hx; mod persistent asthma Anesthesia concerns: BMI 62, Lvnx Blood Bank: A POS (accepts blood products) Fertility / sterilization requests: Desires tubal + iPP LNG-IUD (hx AUB - if retains uterus), M'caid form signed 11/7 in Media tab) Pap: Last Pap on record NILM 2011 - reports hx abnml Pap 2010, 2014 (unable to see) Surgical: Extensive adhesions & Spigellian hernia - Requires GenSurg for entry & closure per outpt GenSurg visit 01/13 (9cm defect, 20cm sac). Last OpNote 03/2015 - Extensive scarring & obliteration of tissue planes in subQ; multiple thin, transparent web adhesions throughout small bowel w/ adhesion to posterior peritoneum & fascia; small bowel deserosalization w/ oversew, attenuated LUS Contraindication(s) to uterotonics or tranexamic acid: Hx Asthma, CHTN  #Ventral hernia - Known large Spigellian hernia (defect 9cm, sac 20cm) in s/o multiple prior CS w/ extensive adhesions (see Prob List) - Multiple prior OSH admissions for pain requiring intermittent opiates since 20w - Admitted to antepartum (12/27-1/2) for SBO r/o incarceration, failed NGT, passed SBFT, improved N/V/pain, successful BM after 1.14mo obstipation >> resolved s/p normal SBFT, EGS signed off, having BMs and tolerating PO, pain improved on q6 Roxi - CTAP (12/27): large R spigelian hernia containing dilated small bowel loop possible transition point, small volume ascites in hernia, c/f obstruction w/ possible early congestive ischemia - 29w ToC per [facility] GenSurg/MFM recommendation for delivery at tertiary care center - s/p GenSurg outpatient (1/13): no further antenatal f/u needed, EGS to be present at delivery for assistance w/ entry/closure. No plan to correct hernia  PLAN - EGS to be present intra-op for abdominal entry, possible bowel repair, and abdominal closure   #cHTN  #History of HELLP / Preeclampsia with SF  - G1 (2005): [redacted]w[redacted]d HELLP, G2 (2007): 24w IUFD, G3  (2008): 28w PEC-SF, G5 (2011): 25w IUFD (  placental infarcts), G6 (2012): [redacted]w[redacted]d NRFHT (placental infarcts), G7 (2016): 35w PEC-SF/GDM  - Previously on Labetalol  200mg  BID per [facility] >> held majority of ante admission due to normotension off meds, attempted reinitiation (1/1) w/ symptomatic hypotension (pt states she reported this rxn prior to [facility]), dc'd and did not require reinitiation of BP med - Procardia XL 30mg  (started 1/6 in OB triage given persistent MRBPs, CBC/CMP/UPC WNL >> HELD for relative hypotension and symptoms at following BP check) - On bASA  PLAN - Short interval PP follow up for BP check  - HELLP labs on admission  - Continue Current Regimen: Procardia XL 30mg   #History of IUFD x2 - G2 (2007): 24w IUFD (unknown etiology) - G5 (2011): 25w IUFD (placental infarcts 30%) >> *Of note, G6 (2012): [redacted]w[redacted]d NRFHT (placental infarcts 50%) -No known autopsy or genetic testing results, APLS testing neg (see Protein S deficiency Prob List)   #Protein S deficiency  - Unconfirmed h/o DVT/PE (pt denies, all imaging neg). On LVX ppx in prior pregnancies. Does not follow with Heme.  - Thrombophilia w/u in 2011: APLS labs neg, PTG mutation neg, FVL neg, ProtC wnl, ProtS deficient -Xa subtherapeutic during prior ante admission. Concerns regarding true therapeutic AC unless necessary given anticipated surgical/neuraxial complexity and significant risk for preterm/unplanned delivery this pregnancy. Discussed dosing w/ Pharmacy while inpatient. Given morbid obesity, ProtS deficiency, and limited mobility, intermediate dose LVX reasonable (0.5mg /kg BID). -Continue LVX 100mg  BID for 6 weeks postpartum   #Suspected Glucose Intolerance  - G7 pregnancy w/ GDM and PEC - [facility] Records: A1c 5.6% (01/2024), 2h GTT 119/170/89 (06/13/24, recent prednisone  course and pt confirmed NOT fasting) - Had been given Freestyle Libre at [facility], reviewed and unlikely GDM >> dc'd  #OSA #Asthma - No h/o intubation, many prior  hospitalizations for exacerbation (usually in s/o URI), most recently as follows:             -[place] [place] (11/20) for abd pain and SOB >> improved w/ nebs/steroid course (11/20-25), resp panel neg, bnp neg, neg KUB/CXR -[facility] antepartum (12/2-4) for SOB >> TTE w/ nl EF, BNP neg, LE duplex neg, CXR w/ pulm edema vs bronchovascular crowding, CTPE neg, CT Abd w/ stable hernia, +adenovirus -[facility] antepartum (12/20-24) for SOB and worsening abd pain after fall >> RUQ US  wnl, BNP neg, rpt CXR/CTPE/CTAP unchanged, +rhinovirus, dc'd on steroid taper (12/21-12/26) and 2L home O2 -Does not follow w/ Pulm; previously has been on several maintenance inhalers (Advair, Symbicort , Breo), most recently rxd Dulera              -[facility] antepartum (12/27-1/2) 1d of transient O2 req while sleeping, stable CXR, dc'd on no O2 w/ increased Lasix  and new Dulera  rx - Current Regimen: Dulera  220-5 BID 2puffs + albuterol  prn PLAN - Dulera  ordered + albuterol  PRN  - Sleep Medicine consult/referral - Pulmonology referral PP    #Presumed CHFpEF - Unclear h/o longstanding possible CHFpEF in s/o clinical sx of dyspnea, orthopnea (also partly due to pannus/hernia), LE edema. All TTE records demonstrating no cardiac dysfunction and BNPs all neg; only 2012 CT Chest noting cardiomegaly and pulm vasc congestion. Has required at least daily Lasix  since 2016. - CXR (12/28): Mild pulmonary interstitial edema.  - TTE (12/3): LV EF 55-60%, nl LV and RV function, nl LA and RA size - s/p Cardiology consult during ante admission: concur with unclear history, presumed CHFpEF, suspect multiple other risk factors for HF-like symptoms, titrated Lasix  to 80mg  BID PO, BNP stably neg at  discharge PLAN - Cardiology follow up PP  - Continue Lasix  80mg  BID PO postop   #Venous Insufficiency Dependent erythema of L plantar surface and toes in s/o chronic lasix -dependent LE edema w/ baseline positional pain/tingling (see Media img, improves w/ elevation,  worsens upon standing) -LE Duplex neg (1/1) PLAN -Continue intermediate LVX ppx (see Thrombophilia Prob List) and lasix  (see CHF Prob List) -Compression socks when ambulating and elevation when resting  #Obesity  - Pregravid BMI 58  - Baseline serum PEC labs neg, UPC neg, A1c 5.6 (01/2024)  - Current BMI: 62.33  #AMA - Age 39 at delivery   #Fetal Wellbeing  -Fetal Monitoring: continuous  -Steroids: 2/4-2/5  -NICU Consult: N/A -Group B Streptococcus: Negative   #Maternal Considerations -Blood Type: A Positive   #Postpartum Planning -Infant feeding: Breast -Contraception: Bilateral tubal sterilization v hysterectomy   Disposition: To OR for scheduled procedure  Subjective    Yunique Dearcos presents for scheduled cesarean delivery and possible cesarean hysterectomy.   OB Review of Systems: Fetal movement: Active Vaginal bleeding: No Loss of fluid: No Contractions: No   Preeclampsia Symptoms Review: Headaches: None currently Vision changes: None currently Chest pain and/or shortness of breath: No RUQ pain: No  Review of Systems: A complete review of systems was performed with pertinent positives as above.   Objective   Temp:  [97.6 F (36.4 C)] 97.6 F (36.4 C) Heart Rate:  [97] 97 Resp:  [22] 22 BP: (119)/(75) 119/75   Physical Exam: GENERAL: Alert, no acute distress CHEST: No increased work of breathing CV: Regular rate, rhythm.  ABDOMEN: Gravid, soft, nontender, protuberant abdomen with palpable L-sided hernia, no overlying skin change, baseline sensitivity to palpation, no rebound/guarding EXTREMITIES:  2+ pitting edema in shins/feet bilaterally with mild erythema bilaterally, appear symmetric, violaceous skin discoloration around heels c/w poor vascular flow upon standing NEURO: CN II-XII grossly intact, 2+ DTRs no clonus SVE: deferred SSE: deferred  Obstetric ultrasounds reviewed in Epic.  Lab results and other imaging reviewed in  Epic.  History   OB History  Gravida Para Term Preterm AB Living  10 7  7 2 5   SAB IAB Ectopic Molar Multiple Live Births  2     5    # Outcome Date GA Lbr Len/2nd Weight Sex Type Anes PTL Lv  10 Current           9 SAB 2024 [redacted]w[redacted]d    Complete     8 Preterm 04/06/15 [redacted]w[redacted]d  2515 g M CS-LTranv Spinal N LIV     Birth Comments: delivered in s/o PEC w/ SF and GDM, LTCS w/ extensive adesions and bowel deserosalization per op report, declined BTL     Complications: Pre-eclampsia  7 SAB 2015 [redacted]w[redacted]d         6 Preterm 04/14/11 [redacted]w[redacted]d  454 g F CS-LTranv  N LIV     Birth Comments: delivered via mid-transverse RCS, for abnl UAD per pt, op report noting NRFHT - no comment on adhesions, placental path w/ 50% infarction     Complications: Pre-eclampsia  5 Preterm 2010 [redacted]w[redacted]d  454 g M VBACold  N FD     Birth Comments: blood clot in placenta     Complications: Pre-eclampsia  4 Preterm 09/23/07 [redacted]w[redacted]d  1871 g F CS-LTranv  N LIV     Birth Comments: denies any issues this pregnancy, LTCS op report w/ extensive adhesions     Complications: Pre-eclampsia  3 Preterm 07/26/06 [redacted]w[redacted]d  1503 g M  CS-LTranv   LIV     Complications: Pre-eclampsia  2 Preterm 07/2005 [redacted]w[redacted]d  454 g M VBACold  N FD     Birth Comments: Arrive w/o fetal demise. Placental with blood clot     Complications: Disorder of hemostasis (CMD)  1 Preterm 09/15/03 [redacted]w[redacted]d  1134 g M CS-LTranv  N LIV     Birth Comments: 1 LTCS, HELLP    Medical History[1]  Surgical History[2]  Coding for today's visit was reached by Medical Decision Making (MDM).  Curtis Gutta, MD  Maternal-Fetal Medicine Fellow, PGY-5 Department of Obstetrics & Gynecology          [1] Past Medical History: Diagnosis Date   Abnormal Pap smear of cervix    Anemia    Asthma (CMD)    Fluid overload    Hypertension    Obesity    Sleep apnea    Spigelian hernia    Venous insufficiency   [2] Past Surgical History: Procedure Laterality Date    APPENDECTOMY  03/2010   CESAREAN SECTION     CS x5   TONSILLECTOMY AND ADENOIDECTOMY

## 2024-08-06 ENCOUNTER — Encounter: Admitting: Obstetrics and Gynecology

## 2024-08-13 ENCOUNTER — Encounter: Admitting: Family Medicine

## 2024-08-20 ENCOUNTER — Encounter: Admitting: Obstetrics and Gynecology

## 2024-08-27 ENCOUNTER — Encounter: Admitting: Family Medicine

## 2024-09-03 ENCOUNTER — Encounter
